# Patient Record
Sex: Female | Born: 1965 | Race: White | Hispanic: No | Marital: Married | Smoking: Former smoker
Health system: Southern US, Community
[De-identification: ages and names within clinical notes are randomized; demographics above are authoritative.]

## PROBLEM LIST (undated history)

## (undated) DIAGNOSIS — I471 Supraventricular tachycardia, unspecified: Secondary | ICD-10-CM

## (undated) DIAGNOSIS — R32 Unspecified urinary incontinence: Secondary | ICD-10-CM

## (undated) DIAGNOSIS — I5189 Other ill-defined heart diseases: Secondary | ICD-10-CM

## (undated) DIAGNOSIS — R51 Headache: Secondary | ICD-10-CM

## (undated) DIAGNOSIS — I4891 Unspecified atrial fibrillation: Secondary | ICD-10-CM

## (undated) DIAGNOSIS — I480 Paroxysmal atrial fibrillation: Secondary | ICD-10-CM

## (undated) DIAGNOSIS — I10 Essential (primary) hypertension: Secondary | ICD-10-CM

## (undated) DIAGNOSIS — D219 Benign neoplasm of connective and other soft tissue, unspecified: Secondary | ICD-10-CM

## (undated) DIAGNOSIS — R519 Headache, unspecified: Secondary | ICD-10-CM

## (undated) HISTORY — PX: GANGLION CYST EXCISION: SHX1691

## (undated) HISTORY — DX: Headache: R51

## (undated) HISTORY — DX: Supraventricular tachycardia, unspecified: I47.10

## (undated) HISTORY — PX: OTHER SURGICAL HISTORY: SHX169

## (undated) HISTORY — DX: Unspecified urinary incontinence: R32

## (undated) HISTORY — DX: Headache, unspecified: R51.9

## (undated) HISTORY — PX: TOOTH EXTRACTION: SUR596

## (undated) HISTORY — DX: Paroxysmal atrial fibrillation: I48.0

## (undated) HISTORY — PX: CARPAL TUNNEL RELEASE: SHX101

## (undated) HISTORY — DX: Other ill-defined heart diseases: I51.89

---

## 1999-05-08 ENCOUNTER — Ambulatory Visit: Admission: RE | Admit: 1999-05-08 | Discharge: 1999-05-08 | Payer: Self-pay | Admitting: Otolaryngology

## 1999-09-11 ENCOUNTER — Ambulatory Visit (HOSPITAL_COMMUNITY): Admission: RE | Admit: 1999-09-11 | Discharge: 1999-09-11 | Payer: Self-pay | Admitting: Orthopedic Surgery

## 1999-09-11 ENCOUNTER — Encounter: Payer: Self-pay | Admitting: Orthopedic Surgery

## 1999-10-13 ENCOUNTER — Other Ambulatory Visit: Admission: RE | Admit: 1999-10-13 | Discharge: 1999-10-13 | Payer: Self-pay | Admitting: Orthopedic Surgery

## 2000-10-10 ENCOUNTER — Encounter: Payer: Self-pay | Admitting: Emergency Medicine

## 2000-10-10 ENCOUNTER — Emergency Department (HOSPITAL_COMMUNITY): Admission: EM | Admit: 2000-10-10 | Discharge: 2000-10-11 | Payer: Self-pay | Admitting: Emergency Medicine

## 2001-07-19 ENCOUNTER — Emergency Department (HOSPITAL_COMMUNITY): Admission: EM | Admit: 2001-07-19 | Discharge: 2001-07-19 | Payer: Self-pay | Admitting: Emergency Medicine

## 2002-02-04 ENCOUNTER — Encounter: Payer: Self-pay | Admitting: Specialist

## 2002-02-04 ENCOUNTER — Encounter: Admission: RE | Admit: 2002-02-04 | Discharge: 2002-02-04 | Payer: Self-pay | Admitting: Specialist

## 2004-02-03 ENCOUNTER — Other Ambulatory Visit: Admission: RE | Admit: 2004-02-03 | Discharge: 2004-02-03 | Payer: Self-pay | Admitting: Obstetrics and Gynecology

## 2005-11-08 ENCOUNTER — Ambulatory Visit: Payer: Self-pay | Admitting: Internal Medicine

## 2005-12-13 ENCOUNTER — Ambulatory Visit: Payer: Self-pay | Admitting: Internal Medicine

## 2005-12-21 ENCOUNTER — Ambulatory Visit: Payer: Self-pay

## 2006-04-03 ENCOUNTER — Ambulatory Visit: Payer: Self-pay | Admitting: Internal Medicine

## 2006-04-08 ENCOUNTER — Encounter (INDEPENDENT_AMBULATORY_CARE_PROVIDER_SITE_OTHER): Payer: Self-pay | Admitting: *Deleted

## 2006-04-08 ENCOUNTER — Other Ambulatory Visit: Admission: RE | Admit: 2006-04-08 | Discharge: 2006-04-08 | Payer: Self-pay | Admitting: Obstetrics and Gynecology

## 2006-05-28 ENCOUNTER — Encounter: Admission: RE | Admit: 2006-05-28 | Discharge: 2006-05-28 | Payer: Self-pay | Admitting: Obstetrics and Gynecology

## 2007-03-27 ENCOUNTER — Emergency Department (HOSPITAL_COMMUNITY): Admission: EM | Admit: 2007-03-27 | Discharge: 2007-03-28 | Payer: Self-pay | Admitting: Emergency Medicine

## 2007-03-28 ENCOUNTER — Ambulatory Visit: Payer: Self-pay | Admitting: Internal Medicine

## 2007-03-28 LAB — CONVERTED CEMR LAB
Inflenza A Ag: NEGATIVE
Influenza B Ag: NEGATIVE

## 2007-03-31 ENCOUNTER — Ambulatory Visit: Payer: Self-pay | Admitting: Cardiology

## 2007-06-19 ENCOUNTER — Encounter: Admission: RE | Admit: 2007-06-19 | Discharge: 2007-06-19 | Payer: Self-pay | Admitting: Obstetrics and Gynecology

## 2008-06-22 ENCOUNTER — Encounter: Admission: RE | Admit: 2008-06-22 | Discharge: 2008-06-22 | Payer: Self-pay | Admitting: Obstetrics and Gynecology

## 2009-01-25 ENCOUNTER — Telehealth: Payer: Self-pay | Admitting: Internal Medicine

## 2009-08-09 ENCOUNTER — Encounter: Admission: RE | Admit: 2009-08-09 | Discharge: 2009-08-09 | Payer: Self-pay | Admitting: Obstetrics and Gynecology

## 2009-10-29 ENCOUNTER — Encounter: Admission: RE | Admit: 2009-10-29 | Discharge: 2009-10-29 | Payer: Self-pay | Admitting: Orthopedic Surgery

## 2010-09-07 ENCOUNTER — Encounter: Admission: RE | Admit: 2010-09-07 | Discharge: 2010-09-07 | Payer: Self-pay | Admitting: Obstetrics and Gynecology

## 2011-01-15 ENCOUNTER — Encounter: Payer: Self-pay | Admitting: Obstetrics and Gynecology

## 2013-01-12 ENCOUNTER — Other Ambulatory Visit: Payer: Self-pay | Admitting: Obstetrics and Gynecology

## 2013-01-12 DIAGNOSIS — D259 Leiomyoma of uterus, unspecified: Secondary | ICD-10-CM

## 2013-01-14 ENCOUNTER — Ambulatory Visit
Admission: RE | Admit: 2013-01-14 | Discharge: 2013-01-14 | Disposition: A | Discharge status: Home / Self Care | Payer: BC Managed Care – PPO | Source: Ambulatory Visit | Attending: Obstetrics and Gynecology | Admitting: Obstetrics and Gynecology

## 2013-01-14 DIAGNOSIS — D259 Leiomyoma of uterus, unspecified: Secondary | ICD-10-CM

## 2013-01-14 HISTORY — DX: Benign neoplasm of connective and other soft tissue, unspecified: D21.9

## 2013-01-14 HISTORY — DX: Essential (primary) hypertension: I10

## 2013-01-15 ENCOUNTER — Other Ambulatory Visit: Payer: Self-pay | Admitting: Radiology

## 2013-01-15 DIAGNOSIS — D219 Benign neoplasm of connective and other soft tissue, unspecified: Secondary | ICD-10-CM

## 2013-01-16 ENCOUNTER — Other Ambulatory Visit: Payer: Self-pay | Admitting: Emergency Medicine

## 2013-01-16 DIAGNOSIS — D219 Benign neoplasm of connective and other soft tissue, unspecified: Secondary | ICD-10-CM

## 2013-01-24 ENCOUNTER — Other Ambulatory Visit: Payer: BC Managed Care – PPO

## 2013-01-31 ENCOUNTER — Other Ambulatory Visit: Payer: BC Managed Care – PPO

## 2013-02-07 ENCOUNTER — Other Ambulatory Visit: Payer: BC Managed Care – PPO

## 2013-02-17 ENCOUNTER — Telehealth: Payer: Self-pay | Admitting: Emergency Medicine

## 2013-02-17 NOTE — Telephone Encounter (Signed)
Lm to see if pt interested in r/s her MRI

## 2013-04-20 ENCOUNTER — Other Ambulatory Visit: Payer: Self-pay | Admitting: Obstetrics and Gynecology

## 2013-04-20 DIAGNOSIS — N6315 Unspecified lump in the right breast, overlapping quadrants: Secondary | ICD-10-CM

## 2013-04-22 ENCOUNTER — Ambulatory Visit
Admission: RE | Admit: 2013-04-22 | Discharge: 2013-04-22 | Disposition: A | Discharge status: Home / Self Care | Payer: BC Managed Care – PPO | Source: Ambulatory Visit | Attending: Obstetrics and Gynecology | Admitting: Obstetrics and Gynecology

## 2013-04-22 DIAGNOSIS — N6315 Unspecified lump in the right breast, overlapping quadrants: Secondary | ICD-10-CM

## 2014-02-12 ENCOUNTER — Encounter: Payer: Self-pay | Admitting: Family Medicine

## 2014-02-12 ENCOUNTER — Ambulatory Visit (INDEPENDENT_AMBULATORY_CARE_PROVIDER_SITE_OTHER): Payer: BC Managed Care – PPO | Admitting: Family Medicine

## 2014-02-12 VITALS — BP 164/102 | HR 79 | Temp 98.1°F | Ht 62.5 in | Wt 172.5 lb

## 2014-02-12 DIAGNOSIS — D259 Leiomyoma of uterus, unspecified: Secondary | ICD-10-CM

## 2014-02-12 DIAGNOSIS — E1159 Type 2 diabetes mellitus with other circulatory complications: Secondary | ICD-10-CM | POA: Insufficient documentation

## 2014-02-12 DIAGNOSIS — Z136 Encounter for screening for cardiovascular disorders: Secondary | ICD-10-CM

## 2014-02-12 DIAGNOSIS — I10 Essential (primary) hypertension: Secondary | ICD-10-CM | POA: Insufficient documentation

## 2014-02-12 DIAGNOSIS — D219 Benign neoplasm of connective and other soft tissue, unspecified: Secondary | ICD-10-CM | POA: Insufficient documentation

## 2014-02-12 DIAGNOSIS — R635 Abnormal weight gain: Secondary | ICD-10-CM | POA: Insufficient documentation

## 2014-02-12 LAB — HEMOGLOBIN A1C
Hgb A1c MFr Bld: 5.4 % (ref <5.7)
Mean Plasma Glucose: 108 mg/dL (ref <117)

## 2014-02-12 MED ORDER — LOSARTAN POTASSIUM-HCTZ 100-12.5 MG PO TABS: 1.0000 | ORAL_TABLET | Freq: Every day | ORAL | Status: DC

## 2014-02-12 NOTE — Assessment & Plan Note (Signed)
Deteriorated.  Refilled hyzaar. She will follow up with me for BP recheck in 2 weeks. The patient indicates understanding of these issues and agrees with the plan.

## 2014-02-12 NOTE — Assessment & Plan Note (Signed)
She is scheduled for hysterectomy with Dr. Radene Knee next month.

## 2014-02-12 NOTE — Progress Notes (Signed)
Subjective:   Patient ID: Wanda Aguilar, female    DOB: [birth year], 48 y.o.   MRN: [id number]  Wanda Aguilar is a pleasant 48 y.o. year old female who presents to clinic today with [place] and discuss BP meds  on 02/12/2014  HPI: HTN- ran out of her Hyzaar two weeks ago.  Can tell BP is elevated.  No HA, blurred vision, CP or SOB.  Fibroids- sees Dr. Radene Knee. He told her yesterday that she needs a complete hysterectomy. Has been having more abdominal pain- scheduled on March 3rd.  Weight gain- has gained quite a bit of weight over past year.  She admits to being able to improve her diet and needs to exercise but is asking about diet pills for a jump start.    Has not had cholesterol checked in a while.  Patient Active Problem List   Diagnosis Date Noted  . HTN (hypertension) 02/12/2014  . Fibroids 02/12/2014  . Weight gain 02/12/2014   Past Medical History  Diagnosis Date  . Hypertension   . Fibroids   . Frequent headaches   . Urine incontinence    Past Surgical History  Procedure Laterality Date  . Ceaserean    . Carpal tunnel release      Right Hand  . Ganglion cyst excision      Bilateral  . Cesarean section    . Tooth extraction     History  Substance Use Topics  . Smoking status: Former Smoker -- 0.30 packs/day for 4 years    Types: Cigarettes    Start date: 01/14/2009  . Smokeless tobacco: Never Used  . Alcohol Use: No   Family History  Problem Relation Age of Onset  . Diabetes Father   . Diabetes Paternal Aunt   . Diabetes Paternal Uncle   . Arthritis Maternal Grandmother   . Alcohol abuse Maternal Grandfather   . Diabetes Paternal Grandmother   . Diabetes Paternal Grandfather    No Known Allergies No current outpatient prescriptions on file prior to visit.   No current facility-administered medications on file prior to visit.   The PMH, PSH, Social History, Family History, Medications, and allergies have been reviewed in [facility], and have  been updated if relevant.   Review of Systems  Constitutional: Negative.   Eyes: Negative.   Respiratory: Negative.   Cardiovascular: Negative.   Endocrine: Negative.   + nasal congestion and sinus pressure for months  See HPI    Objective:    BP 164/102  Pulse 79  Temp(Src) 98.1 F (36.7 C) (Oral)  Ht 5' 2.5" (1.588 m)  Wt 172 lb 8 oz (78.245 kg)  BMI 31.03 kg/m2  SpO2 97%  LMP 01/04/2014   Physical Exam  Nursing note and vitals reviewed. Constitutional: She appears well-developed and well-nourished. No distress.  HENT:  Head: Normocephalic and atraumatic.  Right Ear: Hearing and tympanic membrane normal.  Left Ear: Hearing and tympanic membrane normal.  Nose: Rhinorrhea present. No mucosal edema, sinus tenderness or nasal deformity. Right sinus exhibits no frontal sinus tenderness. Left sinus exhibits no frontal sinus tenderness.  Cardiovascular: Normal rate, regular rhythm and normal pulses.   Pulmonary/Chest: Effort normal and breath sounds normal.  Skin: Skin is warm, dry and intact.  Psychiatric: She has a normal mood and affect. Her speech is normal and behavior is normal. Judgment and thought content normal. Cognition and memory are normal.          Assessment & Plan:  HTN (hypertension) No Follow-up on file.

## 2014-02-12 NOTE — Assessment & Plan Note (Signed)
Discussed with pt- I will not give her an appetite suppressant like phentermine given her HTN. Advised diet, exercise- suggested nutritionist referral. She would like to consider this after her hysterectomy.  She will call me if she decides she wants a referral. Check labs today. Orders Placed This Encounter  Procedures  . Hemoglobin A1c  . Lipid panel  . TSH  . T4, Free  . Comprehensive metabolic panel

## 2014-02-12 NOTE — Patient Instructions (Addendum)
Nice to meet you. We will call you with your lab results.  Try over the counter nasocort-start with 2 sprays per nostril per day...and then try to taper to 1 spray per nostril once symptoms improve.   Restart your blood pressure medication.  Please come see me in 2 weeks.

## 2014-02-12 NOTE — Progress Notes (Signed)
Pre visit review using our clinic review tool, if applicable. No additional management support is needed unless otherwise documented below in the visit note. 

## 2014-02-13 LAB — COMPREHENSIVE METABOLIC PANEL
ALBUMIN: 4.9 g/dL (ref 3.5–5.2)
ALT: 20 U/L (ref 0–35)
AST: 15 U/L (ref 0–37)
Alkaline Phosphatase: 46 U/L (ref 39–117)
BUN: 11 mg/dL (ref 6–23)
CALCIUM: 9.4 mg/dL (ref 8.4–10.5)
CHLORIDE: 103 meq/L (ref 96–112)
CO2: 27 meq/L (ref 19–32)
CREATININE: 0.61 mg/dL (ref 0.50–1.10)
GLUCOSE: 90 mg/dL (ref 70–99)
POTASSIUM: 4.6 meq/L (ref 3.5–5.3)
Sodium: 137 mEq/L (ref 135–145)
Total Bilirubin: 0.5 mg/dL (ref 0.2–1.2)
Total Protein: 6.8 g/dL (ref 6.0–8.3)

## 2014-02-13 LAB — LIPID PANEL
CHOL/HDL RATIO: 4 ratio
Cholesterol: 175 mg/dL (ref 0–200)
HDL: 44 mg/dL (ref >39)
LDL Cholesterol: 96 mg/dL (ref 0–99)
TRIGLYCERIDES: 175 mg/dL — AB (ref <150)
VLDL: 35 mg/dL (ref 0–40)

## 2014-02-13 LAB — TSH: TSH: 0.781 u[IU]/mL (ref 0.350–4.500)

## 2014-02-13 LAB — T4, FREE: Free T4: 1.09 ng/dL (ref 0.80–1.80)

## 2014-02-15 ENCOUNTER — Encounter: Payer: Self-pay | Admitting: *Deleted

## 2014-02-15 ENCOUNTER — Telehealth: Payer: Self-pay | Admitting: Family Medicine

## 2014-02-15 NOTE — Telephone Encounter (Signed)
Relevant patient education mailed to patient.  

## 2014-03-04 ENCOUNTER — Ambulatory Visit (INDEPENDENT_AMBULATORY_CARE_PROVIDER_SITE_OTHER): Payer: BC Managed Care – PPO | Admitting: Family Medicine

## 2014-03-04 ENCOUNTER — Encounter: Payer: Self-pay | Admitting: Family Medicine

## 2014-03-04 VITALS — BP 128/62 | HR 78 | Temp 97.8°F | Wt 171.2 lb

## 2014-03-04 DIAGNOSIS — I10 Essential (primary) hypertension: Secondary | ICD-10-CM

## 2014-03-04 DIAGNOSIS — F411 Generalized anxiety disorder: Secondary | ICD-10-CM | POA: Insufficient documentation

## 2014-03-04 MED ORDER — SERTRALINE HCL 25 MG PO TABS: 25.0000 mg | ORAL_TABLET | Freq: Every day | ORAL | Status: DC

## 2014-03-04 MED ORDER — ALPRAZOLAM 0.25 MG PO TABS: 0.2500 mg | ORAL_TABLET | Freq: Two times a day (BID) | ORAL | Status: DC | PRN

## 2014-03-04 NOTE — Patient Instructions (Signed)
Good to see you. We are starting zoloft 25 mg daily (please start this during the weekend). Use xanax as needed for panic attacks.

## 2014-03-04 NOTE — Assessment & Plan Note (Signed)
Well controlled in our office. No change in rx. Advised NOT checking BP at work. The patient indicates understanding of these issues and agrees with the plan.

## 2014-03-04 NOTE — Progress Notes (Signed)
Pre visit review using our clinic review tool, if applicable. No additional management support is needed unless otherwise documented below in the visit note. 

## 2014-03-04 NOTE — Assessment & Plan Note (Signed)
She is very concerned about starting SSRI.  Asks for valium but I explained benzos are not good long term solution.  After some counseling, she agreed to start zoloft 25 mg daily- prn xanax (#20) to use only for panic attacks until zoloft can get into her system in therapeutic dose. Declined psychotherapy. Follow up in 3 weeks.

## 2014-03-04 NOTE — Progress Notes (Signed)
Subjective:   Patient ID: Wayland Denis, female    DOB: 16-Jan-1966, 48 y.o.   MRN: [id number]  Daleyssa Loiselle Breth is a pleasant 48 y.o. year old female who presents to clinic today with Follow-up  on 03/04/2014  HPI: HTN- When she established care with me last month, ran out of her Hyzaar a couple of weeks prior to her appointment.  Could tell BP was elevated.  No HA, blurred vision, CP or SOB.  Lab Results  Component Value Date   CREATININE 0.61 02/12/2014    Refilled her hyzaar 100-12.5 mg daily and advised her to follow up with me in 2 weeks.  She is here today to follow up. Since I last saw her, a friend gave her a home BP machine. Has been checking her BP at work and often very elevated in 160s/90s.  She is very tearful today- under a lot of stress.  In pain and wants to have her surgery.  She is concerned that she will not be able to have the surgery if her BP is elevated.  A lot of stressors with her daughter and her mother as well. Denies feeling depressed.  Can feel her heart racing at times.  Sleeping ok. Appetite good.  Weight stable. Wt Readings from Last 3 Encounters:  03/04/14 171 lb 4 oz (77.678 kg)  02/12/14 172 lb 8 oz (78.245 kg)  01/14/13 160 lb (72.576 kg)       Patient Active Problem List   Diagnosis Date Noted  . HTN (hypertension) 02/12/2014  . Fibroids 02/12/2014  . Weight gain 02/12/2014   Past Medical History  Diagnosis Date  . Hypertension   . Fibroids   . Frequent headaches   . Urine incontinence    Past Surgical History  Procedure Laterality Date  . Ceaserean    . Carpal tunnel release      Right Hand  . Ganglion cyst excision      Bilateral  . Cesarean section    . Tooth extraction     History  Substance Use Topics  . Smoking status: Former Smoker -- 0.30 packs/day for 4 years    Types: Cigarettes    Start date: 01/14/2009  . Smokeless tobacco: Never Used  . Alcohol Use: No   Family History  Problem Relation Age of Onset  .  Diabetes Father   . Diabetes Paternal Aunt   . Diabetes Paternal Uncle   . Arthritis Maternal Grandmother   . Alcohol abuse Maternal Grandfather   . Diabetes Paternal Grandmother   . Diabetes Paternal Grandfather    No Known Allergies Current Outpatient Prescriptions on File Prior to Visit  Medication Sig Dispense Refill  . losartan-hydrochlorothiazide (HYZAAR) 100-12.5 MG per tablet Take 1 tablet by mouth daily.  90 tablet  3   No current facility-administered medications on file prior to visit.   The PMH, PSH, Social History, Family History, Medications, and allergies have been reviewed in [facility], and have been updated if relevant.   Review of Systems  Constitutional: Negative.   Eyes: Negative.   Respiratory: Negative.   Cardiovascular: Negative.   Endocrine: Negative.    See HPI    Objective:    LMP 01/04/2014   Physical Exam  Nursing note and vitals reviewed. Constitutional: She appears well-developed and well-nourished. No distress.  HENT:  Head: Normocephalic and atraumatic.  Right Ear: Hearing and tympanic membrane normal.  Left Ear: Hearing and tympanic membrane normal.  Nose: No mucosal edema, rhinorrhea,  sinus tenderness or nasal deformity. Right sinus exhibits no frontal sinus tenderness. Left sinus exhibits no frontal sinus tenderness.  Cardiovascular: Normal rate, regular rhythm and normal pulses.   Pulmonary/Chest: Effort normal and breath sounds normal.  Skin: Skin is warm, dry and intact.  Psychiatric: Her speech is normal and behavior is normal. Judgment and thought content normal. Her mood appears anxious. Cognition and memory are normal.          Assessment & Plan:   No diagnosis found. No Follow-up on file.

## 2014-03-05 ENCOUNTER — Telehealth: Payer: Self-pay | Admitting: Family Medicine

## 2014-03-05 NOTE — Telephone Encounter (Signed)
Relevant patient education mailed to patient.  

## 2014-03-29 ENCOUNTER — Ambulatory Visit: Payer: BC Managed Care – PPO | Admitting: Family Medicine

## 2014-04-02 ENCOUNTER — Encounter (HOSPITAL_COMMUNITY): Payer: Self-pay | Admitting: Pharmacist

## 2014-04-09 ENCOUNTER — Encounter (HOSPITAL_COMMUNITY): Payer: Self-pay

## 2014-04-09 ENCOUNTER — Encounter (HOSPITAL_COMMUNITY)
Admission: RE | Admit: 2014-04-09 | Discharge: 2014-04-09 | Disposition: A | Discharge status: Home / Self Care | Payer: BC Managed Care – PPO | Source: Ambulatory Visit | Attending: Obstetrics and Gynecology | Admitting: Obstetrics and Gynecology

## 2014-04-09 DIAGNOSIS — Z01812 Encounter for preprocedural laboratory examination: Secondary | ICD-10-CM | POA: Insufficient documentation

## 2014-04-09 DIAGNOSIS — Z01810 Encounter for preprocedural cardiovascular examination: Secondary | ICD-10-CM | POA: Insufficient documentation

## 2014-04-09 LAB — BASIC METABOLIC PANEL
BUN: 10 mg/dL (ref 6–23)
CHLORIDE: 98 meq/L (ref 96–112)
CO2: 26 mEq/L (ref 19–32)
Calcium: 9.3 mg/dL (ref 8.4–10.5)
Creatinine, Ser: 0.61 mg/dL (ref 0.50–1.10)
GFR calc Af Amer: >90 mL/min (ref >90)
Glucose, Bld: 109 mg/dL — ABNORMAL HIGH (ref 70–99)
POTASSIUM: 3.2 meq/L — AB (ref 3.7–5.3)
SODIUM: 137 meq/L (ref 137–147)

## 2014-04-09 LAB — CBC
HCT: 41 % (ref 36.0–46.0)
Hemoglobin: 14.5 g/dL (ref 12.0–15.0)
MCH: 32.2 pg (ref 26.0–34.0)
MCHC: 35.4 g/dL (ref 30.0–36.0)
MCV: 90.9 fL (ref 78.0–100.0)
Platelets: 238 10*3/uL (ref 150–400)
RBC: 4.51 MIL/uL (ref 3.87–5.11)
RDW: 12.7 % (ref 11.5–15.5)
WBC: 7.4 10*3/uL (ref 4.0–10.5)

## 2014-04-09 NOTE — H&P (Signed)
  Patient name    Mckaylin, Bastien DICTATION#  [id number] CSN# [id number]  Darlyn Chamber, MD 04/09/2014 8:34 AM

## 2014-04-09 NOTE — H&P (Signed)
Wanda Aguilar, Wanda Aguilar               ACCOUNT NO.:  [id number]  MEDICAL RECORD NO.:  [id number]  LOCATION:  [facility]                         FACILITY:  [facility]  PHYSICIAN:  Darlyn Chamber, M.D.   DATE OF BIRTH:  1966-11-24  DATE OF ADMISSION:  02/22/2014 DATE OF DISCHARGE:                             HISTORY & PHYSICAL   HISTORY OF PRESENT ILLNESS:  The patient is a 48 year old, gravida 2, para 2 female presents for total abdominal hysterectomy with bilateral salpingo-oophorectomy.  The patient has had enlarging uterine fibroids with associated symptomatology.  She has had been increased in pressure, backache, and abnormal bleeding.  Ultrasound has confirmed that the fibroids have enlarged until almost the umbilicus.  We had discussed alternatives including trying to suppress with hormonal agents versus radiological embolization versus myomectomy versus hysterectomy.  The patient now presents for the above-noted surgery.  ALLERGIES:  She has no known drug allergies.  MEDICATIONS:  She is on losartan/hydrochlorothiazide 100/12.5.  She is also on Zoloft each day.  PAST MEDICAL HISTORY:  She has a history of hypertension under active management.  Does have a history of migraine headaches.  She has had a history of an abnormal Pap smear with subsequent LEEP of the cervix to be a moderate dysplasia.  She has had 2 prior cesarean sections, the last one done with bilateral tubal ligation.  PAST SOCIAL HISTORY:  Tobacco use.  She is not smoking at the present time.  No alcohol use.  FAMILY HISTORY:  Noncontributory.  REVIEW OF SYSTEMS:  Noncontributory.  PHYSICAL EXAMINATION:  VITAL SIGNS:  The patient is afebrile, stable vital signs. HEENT:  The patient is normocephalic.  Pupils equal, round, reactive to light and accommodation.  Extraocular movements were intact.  Sclerae and conjunctivae are clear.  Oropharynx clear. NECK:  Without thyromegaly. BREASTS:  Not examined. LUNGS:   Clear. HEART:  Regular rate without murmurs or gallops. ABDOMEN:  She has a well-healed low-transverse incision.  Fibroids up to the umbilicus.  No organomegaly noted. PELVIC:  Normal external genitalia.  Vaginal cuff is clear.  Cervix unremarkable.  Uterus 18 weeks in size.  Adnexa, difficult to assess. EXTREMITIES:  Trace edema. NEUROLOGIC:  Gross normal limits.  IMPRESSION: 1. Enlarging fibroids with associated symptomatology. 2. Hypertension, under active management. 3. History of cervical dysplasia.  PLAN:  The patient undergo a total abdominal hysterectomy with bilateral salpingo-oophorectomy.  I have discussed the option of leaving the ovaries and taking the tubes.  However, she wants ovaries removed.  The potential need for estrogen replacement therapy with associated risks and complications have been discussed.  The risks of surgery explained including the risk of infection.  The risk of hemorrhage that could require transfusion with the risk of AIDS or hepatitis.  The risk of injury to adjacent organs such as bladder, bowel, or ureters that could require further exploratory surgery.  Risk of deep venous thrombosis and pulmonary emboli.  The patient does understand potential risks, complications, and alternatives.     Darlyn Chamber, M.D.     JSM/MEDQ  D:  04/09/2014  T:  04/09/2014  Job:  [id number]

## 2014-04-09 NOTE — Patient Instructions (Signed)
[place]  04/09/2014   Your procedure is scheduled on:  04/13/14  Enter through the Main Entrance of [facility] at [place] up the phone at the desk and dial [phone].   Call this number if you have problems the morning of surgery: [phone]   Remember:   Do not eat food:After Midnight.  Do not drink clear liquids: After Midnight.  Take these medicines the morning of surgery with A SIP OF WATER: Blood pressure medication   Do not wear jewelry, make-up or nail polish.  Do not wear lotions, powders, or perfumes. You may wear deodorant.  Do not shave 48 hours prior to surgery.  Do not bring valuables to the hospital.  [facility] is not   responsible for any belongings or valuables brought to the hospital.  Contacts, dentures or bridgework may not be worn into surgery.  Leave suitcase in the car. After surgery it may be brought to your room.  For patients admitted to the hospital, checkout time is 11:00 AM the day of              discharge.   Patients discharged the day of surgery will not be allowed to drive             home.  Name and phone number of your driver: NA  Special Instructions:      Please read over the following fact sheets that you were given:   Surgical Site Infection Prevention

## 2014-04-13 ENCOUNTER — Inpatient Hospital Stay (HOSPITAL_COMMUNITY): Payer: BC Managed Care – PPO | Admitting: Anesthesiology

## 2014-04-13 ENCOUNTER — Encounter (HOSPITAL_COMMUNITY): Payer: BC Managed Care – PPO | Admitting: Anesthesiology

## 2014-04-13 ENCOUNTER — Encounter (HOSPITAL_COMMUNITY)
Admission: RE | Disposition: A | Discharge status: Home / Self Care | Payer: Self-pay | Source: Ambulatory Visit | Attending: Obstetrics and Gynecology

## 2014-04-13 ENCOUNTER — Encounter (HOSPITAL_COMMUNITY): Payer: Self-pay | Admitting: *Deleted

## 2014-04-13 ENCOUNTER — Inpatient Hospital Stay (HOSPITAL_COMMUNITY)
Admission: RE | Admit: 2014-04-13 | Discharge: 2014-04-15 | DRG: 743 | Disposition: A | Discharge status: Home / Self Care | Payer: BC Managed Care – PPO | Source: Ambulatory Visit | Attending: Obstetrics and Gynecology | Admitting: Obstetrics and Gynecology

## 2014-04-13 DIAGNOSIS — Z9079 Acquired absence of other genital organ(s): Secondary | ICD-10-CM

## 2014-04-13 DIAGNOSIS — I10 Essential (primary) hypertension: Secondary | ICD-10-CM | POA: Diagnosis present

## 2014-04-13 DIAGNOSIS — D251 Intramural leiomyoma of uterus: Principal | ICD-10-CM | POA: Diagnosis present

## 2014-04-13 DIAGNOSIS — Z90722 Acquired absence of ovaries, bilateral: Secondary | ICD-10-CM

## 2014-04-13 DIAGNOSIS — Z87410 Personal history of cervical dysplasia: Secondary | ICD-10-CM

## 2014-04-13 DIAGNOSIS — D250 Submucous leiomyoma of uterus: Secondary | ICD-10-CM | POA: Diagnosis present

## 2014-04-13 DIAGNOSIS — N8300 Follicular cyst of ovary, unspecified side: Secondary | ICD-10-CM | POA: Diagnosis present

## 2014-04-13 DIAGNOSIS — Z87891 Personal history of nicotine dependence: Secondary | ICD-10-CM

## 2014-04-13 DIAGNOSIS — D252 Subserosal leiomyoma of uterus: Secondary | ICD-10-CM | POA: Diagnosis present

## 2014-04-13 DIAGNOSIS — Z90710 Acquired absence of both cervix and uterus: Secondary | ICD-10-CM

## 2014-04-13 HISTORY — PX: ABDOMINAL HYSTERECTOMY: SHX81

## 2014-04-13 LAB — POTASSIUM: POTASSIUM: 3.8 meq/L (ref 3.7–5.3)

## 2014-04-13 LAB — HCG, SERUM, QUALITATIVE: PREG SERUM: NEGATIVE

## 2014-04-13 SURGERY — HYSTERECTOMY, ABDOMINAL
Anesthesia: General | Site: Abdomen | Laterality: Bilateral

## 2014-04-13 MED ORDER — HYDROMORPHONE 0.3 MG/ML IV SOLN
INTRAVENOUS | Status: DC
  Administered 2014-04-13: 0.99 mg via INTRAVENOUS
  Administered 2014-04-13: 0.6 mg via INTRAVENOUS
  Administered 2014-04-13: 2.59 mg via INTRAVENOUS
  Administered 2014-04-13: 12:00:00 via INTRAVENOUS
  Administered 2014-04-14: 0.799 mg via INTRAVENOUS
  Administered 2014-04-14: 1.59 mg via INTRAVENOUS
  Administered 2014-04-14: 0.4 mg via INTRAVENOUS
  Filled 2014-04-13: qty 25

## 2014-04-13 MED ORDER — ACETAMINOPHEN 325 MG PO TABS
650.0000 mg | ORAL_TABLET | ORAL | Status: DC | PRN
  Administered 2014-04-14: 650 mg via ORAL
  Filled 2014-04-13: qty 2

## 2014-04-13 MED ORDER — SODIUM CHLORIDE 0.9 % IJ SOLN
INTRAMUSCULAR | Status: AC
  Filled 2014-04-13: qty 50

## 2014-04-13 MED ORDER — HYDROMORPHONE HCL PF 1 MG/ML IJ SOLN
INTRAMUSCULAR | Status: DC | PRN
  Administered 2014-04-13 (×2): 1 mg via INTRAVENOUS

## 2014-04-13 MED ORDER — ONDANSETRON HCL 4 MG/2ML IJ SOLN
INTRAMUSCULAR | Status: AC
  Filled 2014-04-13: qty 2

## 2014-04-13 MED ORDER — HYDROCHLOROTHIAZIDE 12.5 MG PO CAPS
12.5000 mg | ORAL_CAPSULE | Freq: Every day | ORAL | Status: DC
  Filled 2014-04-13 (×3): qty 1

## 2014-04-13 MED ORDER — LOSARTAN POTASSIUM 50 MG PO TABS
100.0000 mg | ORAL_TABLET | Freq: Every day | ORAL | Status: DC
  Filled 2014-04-13 (×3): qty 2

## 2014-04-13 MED ORDER — MENTHOL 3 MG MT LOZG
1.0000 | LOZENGE | OROMUCOSAL | Status: DC | PRN
  Filled 2014-04-13: qty 9

## 2014-04-13 MED ORDER — NALOXONE HCL 0.4 MG/ML IJ SOLN: 0.4000 mg | INTRAMUSCULAR | Status: DC | PRN

## 2014-04-13 MED ORDER — BUPIVACAINE LIPOSOME 1.3 % IJ SUSP
20.0000 mL | Freq: Once | INTRAMUSCULAR | Status: DC
Start: 2014-04-13 — End: 2014-04-13
  Filled 2014-04-13: qty 20

## 2014-04-13 MED ORDER — PROMETHAZINE HCL 25 MG/ML IJ SOLN: 6.2500 mg | INTRAMUSCULAR | Status: DC | PRN

## 2014-04-13 MED ORDER — MIDAZOLAM HCL 2 MG/2ML IJ SOLN: 0.5000 mg | Freq: Once | INTRAMUSCULAR | Status: DC | PRN

## 2014-04-13 MED ORDER — SERTRALINE HCL 25 MG PO TABS
25.0000 mg | ORAL_TABLET | Freq: Every day | ORAL | Status: DC
  Administered 2014-04-14: 25 mg via ORAL
  Filled 2014-04-13 (×3): qty 1

## 2014-04-13 MED ORDER — ONDANSETRON HCL 4 MG/2ML IJ SOLN
4.0000 mg | Freq: Four times a day (QID) | INTRAMUSCULAR | Status: DC | PRN
  Administered 2014-04-13: 4 mg via INTRAVENOUS

## 2014-04-13 MED ORDER — HYDROMORPHONE HCL PF 1 MG/ML IJ SOLN
INTRAMUSCULAR | Status: AC
  Administered 2014-04-13: 0.5 mg via INTRAVENOUS
  Filled 2014-04-13: qty 1

## 2014-04-13 MED ORDER — EPHEDRINE SULFATE 50 MG/ML IJ SOLN
INTRAMUSCULAR | Status: DC | PRN
  Administered 2014-04-13: 15 mg via INTRAVENOUS
  Administered 2014-04-13: 10 mg via INTRAVENOUS

## 2014-04-13 MED ORDER — FENTANYL CITRATE 0.05 MG/ML IJ SOLN
INTRAMUSCULAR | Status: AC
  Filled 2014-04-13: qty 5

## 2014-04-13 MED ORDER — KETOROLAC TROMETHAMINE 30 MG/ML IJ SOLN
INTRAMUSCULAR | Status: AC
  Administered 2014-04-13: 30 mg via INTRAVENOUS
  Filled 2014-04-13: qty 1

## 2014-04-13 MED ORDER — GLYCOPYRROLATE 0.2 MG/ML IJ SOLN
INTRAMUSCULAR | Status: AC
  Filled 2014-04-13: qty 2

## 2014-04-13 MED ORDER — DIPHENHYDRAMINE HCL 50 MG/ML IJ SOLN: 12.5000 mg | Freq: Four times a day (QID) | INTRAMUSCULAR | Status: DC | PRN

## 2014-04-13 MED ORDER — BUPIVACAINE LIPOSOME 1.3 % IJ SUSP
INTRAMUSCULAR | Status: DC | PRN
  Administered 2014-04-13: 20 mL

## 2014-04-13 MED ORDER — HYDROMORPHONE HCL PF 1 MG/ML IJ SOLN
INTRAMUSCULAR | Status: AC
  Filled 2014-04-13: qty 1

## 2014-04-13 MED ORDER — ONDANSETRON HCL 4 MG/2ML IJ SOLN
4.0000 mg | Freq: Four times a day (QID) | INTRAMUSCULAR | Status: DC | PRN
  Filled 2014-04-13: qty 2

## 2014-04-13 MED ORDER — KETOROLAC TROMETHAMINE 30 MG/ML IJ SOLN
15.0000 mg | Freq: Once | INTRAMUSCULAR | Status: AC | PRN
  Administered 2014-04-13: 30 mg via INTRAVENOUS

## 2014-04-13 MED ORDER — OXYCODONE-ACETAMINOPHEN 5-325 MG PO TABS
1.0000 | ORAL_TABLET | ORAL | Status: DC | PRN
  Administered 2014-04-14 – 2014-04-15 (×5): 2 via ORAL
  Filled 2014-04-13 (×5): qty 2

## 2014-04-13 MED ORDER — LACTATED RINGERS IV SOLN
INTRAVENOUS | Status: DC
  Administered 2014-04-13: 23:00:00 via INTRAVENOUS
  Filled 2014-04-13 (×11): qty 1000

## 2014-04-13 MED ORDER — LACTATED RINGERS IV SOLN
INTRAVENOUS | Status: DC
  Administered 2014-04-13 (×3): via INTRAVENOUS

## 2014-04-13 MED ORDER — SODIUM CHLORIDE 0.9 % IJ SOLN: 9.0000 mL | INTRAMUSCULAR | Status: DC | PRN

## 2014-04-13 MED ORDER — NEOSTIGMINE METHYLSULFATE 1 MG/ML IJ SOLN
INTRAMUSCULAR | Status: AC
  Filled 2014-04-13: qty 1

## 2014-04-13 MED ORDER — FENTANYL CITRATE 0.05 MG/ML IJ SOLN
INTRAMUSCULAR | Status: DC | PRN
  Administered 2014-04-13 (×2): 50 ug via INTRAVENOUS
  Administered 2014-04-13: 100 ug via INTRAVENOUS
  Administered 2014-04-13: 50 ug via INTRAVENOUS

## 2014-04-13 MED ORDER — PROPOFOL 10 MG/ML IV EMUL
INTRAVENOUS | Status: AC
  Filled 2014-04-13: qty 20

## 2014-04-13 MED ORDER — EPHEDRINE 5 MG/ML INJ
INTRAVENOUS | Status: AC
  Filled 2014-04-13: qty 10

## 2014-04-13 MED ORDER — MEPERIDINE HCL 25 MG/ML IJ SOLN: 6.2500 mg | INTRAMUSCULAR | Status: DC | PRN

## 2014-04-13 MED ORDER — NEOSTIGMINE METHYLSULFATE 1 MG/ML IJ SOLN
INTRAMUSCULAR | Status: DC | PRN
  Administered 2014-04-13: 3 mg via INTRAVENOUS

## 2014-04-13 MED ORDER — ROCURONIUM BROMIDE 100 MG/10ML IV SOLN
INTRAVENOUS | Status: AC
  Filled 2014-04-13: qty 1

## 2014-04-13 MED ORDER — DEXAMETHASONE SODIUM PHOSPHATE 10 MG/ML IJ SOLN
INTRAMUSCULAR | Status: DC | PRN
  Administered 2014-04-13: 5 mg via INTRAVENOUS

## 2014-04-13 MED ORDER — GLYCOPYRROLATE 0.2 MG/ML IJ SOLN
INTRAMUSCULAR | Status: DC | PRN
  Administered 2014-04-13: .4 mg via INTRAVENOUS

## 2014-04-13 MED ORDER — PROPOFOL 10 MG/ML IV BOLUS
INTRAVENOUS | Status: DC | PRN
  Administered 2014-04-13: 180 mg via INTRAVENOUS

## 2014-04-13 MED ORDER — SODIUM CHLORIDE 0.9 % IJ SOLN
INTRAMUSCULAR | Status: DC | PRN
  Administered 2014-04-13: 50 mL via INTRAVENOUS

## 2014-04-13 MED ORDER — ROCURONIUM BROMIDE 100 MG/10ML IV SOLN
INTRAVENOUS | Status: DC | PRN
  Administered 2014-04-13: 40 mg via INTRAVENOUS
  Administered 2014-04-13: 5 mg via INTRAVENOUS
  Administered 2014-04-13: 10 mg via INTRAVENOUS

## 2014-04-13 MED ORDER — KETOROLAC TROMETHAMINE 60 MG/2ML IM SOLN
INTRAMUSCULAR | Status: AC
  Filled 2014-04-13: qty 2

## 2014-04-13 MED ORDER — DIPHENHYDRAMINE HCL 12.5 MG/5ML PO ELIX
12.5000 mg | ORAL_SOLUTION | Freq: Four times a day (QID) | ORAL | Status: DC | PRN
  Filled 2014-04-13: qty 5

## 2014-04-13 MED ORDER — CEFAZOLIN SODIUM-DEXTROSE 2-3 GM-% IV SOLR
2.0000 g | INTRAVENOUS | Status: AC
  Administered 2014-04-13: 2 g via INTRAVENOUS

## 2014-04-13 MED ORDER — CEFAZOLIN SODIUM-DEXTROSE 2-3 GM-% IV SOLR
INTRAVENOUS | Status: AC
  Filled 2014-04-13: qty 50

## 2014-04-13 MED ORDER — HYDROMORPHONE HCL PF 1 MG/ML IJ SOLN
0.2500 mg | INTRAMUSCULAR | Status: DC | PRN
  Administered 2014-04-13: 0.5 mg via INTRAVENOUS
  Administered 2014-04-13: 0.25 mg via INTRAVENOUS
  Administered 2014-04-13: 0.5 mg via INTRAVENOUS

## 2014-04-13 MED ORDER — ONDANSETRON HCL 4 MG PO TABS: 4.0000 mg | ORAL_TABLET | Freq: Four times a day (QID) | ORAL | Status: DC | PRN

## 2014-04-13 MED ORDER — MIDAZOLAM HCL 2 MG/2ML IJ SOLN
INTRAMUSCULAR | Status: AC
  Filled 2014-04-13: qty 2

## 2014-04-13 MED ORDER — ONDANSETRON HCL 4 MG/2ML IJ SOLN
INTRAMUSCULAR | Status: DC | PRN
  Administered 2014-04-13: 4 mg via INTRAVENOUS

## 2014-04-13 MED ORDER — LOSARTAN POTASSIUM-HCTZ 100-12.5 MG PO TABS: 1.0000 | ORAL_TABLET | Freq: Every day | ORAL | Status: DC

## 2014-04-13 MED ORDER — MIDAZOLAM HCL 2 MG/2ML IJ SOLN
INTRAMUSCULAR | Status: DC | PRN
  Administered 2014-04-13: 2 mg via INTRAVENOUS

## 2014-04-13 MED ORDER — LIDOCAINE HCL (CARDIAC) 20 MG/ML IV SOLN
INTRAVENOUS | Status: AC
  Filled 2014-04-13: qty 5

## 2014-04-13 SURGICAL SUPPLY — 29 items
CANISTER SUCT 3000ML (MISCELLANEOUS) ×2 IMPLANT
CLOTH BEACON ORANGE TIMEOUT ST (SAFETY) ×2 IMPLANT
DRAPE WARM FLUID 44X44 (DRAPE) IMPLANT
DRSG OPSITE POSTOP 4X10 (GAUZE/BANDAGES/DRESSINGS) ×2 IMPLANT
GLOVE BIO SURGEON STRL SZ7 (GLOVE) ×2 IMPLANT
GOWN STRL REUS W/TWL LRG LVL3 (GOWN DISPOSABLE) ×4 IMPLANT
NEEDLE HYPO 22GX1.5 SAFETY (NEEDLE) IMPLANT
NS IRRIG 1000ML POUR BTL (IV SOLUTION) ×2 IMPLANT
PACK ABDOMINAL GYN (CUSTOM PROCEDURE TRAY) ×2 IMPLANT
PAD OB MATERNITY 4.3X12.25 (PERSONAL CARE ITEMS) ×2 IMPLANT
PROTECTOR NERVE ULNAR (MISCELLANEOUS) ×2 IMPLANT
SPONGE LAP 18X18 X RAY DECT (DISPOSABLE) ×4 IMPLANT
STAPLER VISISTAT 35W (STAPLE) IMPLANT
STRIP CLOSURE SKIN 1/4X4 (GAUZE/BANDAGES/DRESSINGS) ×2 IMPLANT
SUT CHROMIC 0 SH (SUTURE) IMPLANT
SUT CHROMIC 2 0 SH (SUTURE) IMPLANT
SUT CHROMIC 3 0 SH 27 (SUTURE) IMPLANT
SUT MON AB 4-0 PS1 27 (SUTURE) IMPLANT
SUT PDS AB 0 CT 36 (SUTURE) ×2 IMPLANT
SUT VIC AB 0 CT1 18XCR BRD8 (SUTURE) ×3 IMPLANT
SUT VIC AB 0 CT1 27 (SUTURE) ×1
SUT VIC AB 0 CT1 27XBRD ANBCTR (SUTURE) ×1 IMPLANT
SUT VIC AB 0 CT1 8-18 (SUTURE) ×3
SUT VIC AB 2-0 CT1 (SUTURE) ×2 IMPLANT
SUT VICRYL 0 TIES 12 18 (SUTURE) ×2 IMPLANT
SYR 30ML LL (SYRINGE) ×2 IMPLANT
TOWEL OR 17X24 6PK STRL BLUE (TOWEL DISPOSABLE) ×4 IMPLANT
TRAY FOLEY CATH 14FR (SET/KITS/TRAYS/PACK) ×2 IMPLANT
WATER STERILE IRR 1000ML POUR (IV SOLUTION) ×2 IMPLANT

## 2014-04-13 NOTE — Anesthesia Preprocedure Evaluation (Addendum)
Anesthesia Evaluation  Patient identified by MRN, date of birth, ID band Patient awake    Reviewed: Allergy & Precautions, H&P , Patient's Chart, lab work & pertinent test results, reviewed documented beta blocker date and time   History of Anesthesia Complications Negative for: history of anesthetic complications  Airway Mallampati: II TM Distance: >3 FB Neck ROM: full    Dental   Pulmonary former smoker,  breath sounds clear to auscultation        Cardiovascular Exercise Tolerance: Good hypertension, Rhythm:regular Rate:Normal     Neuro/Psych  Headaches,    GI/Hepatic   Endo/Other    Renal/GU      Musculoskeletal   Abdominal   Peds  Hematology   Anesthesia Other Findings   Reproductive/Obstetrics                          Anesthesia Physical Anesthesia Plan  ASA: II  Anesthesia Plan: General ETT   Post-op Pain Management:    Induction:   Airway Management Planned:   Additional Equipment:   Intra-op Plan:   Post-operative Plan:   Informed Consent: I have reviewed the patients History and Physical, chart, labs and discussed the procedure including the risks, benefits and alternatives for the proposed anesthesia with the patient or authorized representative who has indicated his/her understanding and acceptance.   Dental Advisory Given  Plan Discussed with: CRNA and Surgeon  Anesthesia Plan Comments:         Anesthesia Quick Evaluation

## 2014-04-13 NOTE — Brief Op Note (Signed)
04/13/2014  9:06 AM  PATIENT:  Wanda Aguilar  48 y.o. female  PRE-OPERATIVE DIAGNOSIS:  FIBROIDS  POST-OPERATIVE DIAGNOSIS:  * No post-op diagnosis entered *  PROCEDURE:  Procedure(s): HYSTERECTOMY ABDOMINAL WITH BILATERAL SALPINGO OOPHORECTOMY (Bilateral)  SURGEON:  Surgeon(s) and Role:    * Darlyn Chamber, MD - Primary    * Allena Katz, MD - Assisting  PHYSICIAN ASSISTANT:   ASSISTANTS: tomblin   ANESTHESIA:   local and general  EBL:  Total I/O In: 2000 [I.V.:2000] Out: 950 [Urine:500; Blood:450]  BLOOD ADMINISTERED:none  DRAINS: Urinary Catheter (Foley)   LOCAL MEDICATIONS USED:  MARCAINE     SPECIMEN:  Source of Specimen:  uterus tubes and ovaries  DISPOSITION OF SPECIMEN:  PATHOLOGY  COUNTS:  YES  TOURNIQUET:  * No tourniquets in log *  DICTATION: .Other Dictation: Dictation Number [id number]  PLAN OF CARE: Admit for overnight observation  PATIENT DISPOSITION:  PACU - hemodynamically stable.   Delay start of Pharmacological VTE agent (>24hrs) due to surgical blood loss or risk of bleeding: no

## 2014-04-13 NOTE — Op Note (Signed)
Patient name Wanda Aguilar, [place] DICTATION#  [id number] CSN# [id number]  Darlyn Chamber, MD 04/13/2014 9:08 AM

## 2014-04-13 NOTE — Transfer of Care (Signed)
Immediate Anesthesia Transfer of Care Note  Patient: Wanda Aguilar  Procedure(s) Performed: Procedure(s): HYSTERECTOMY ABDOMINAL WITH BILATERAL SALPINGO OOPHORECTOMY (Bilateral)  Patient Location: PACU  Anesthesia Type:General  Level of Consciousness: awake, alert  and oriented  Airway & Oxygen Therapy: Patient Spontanous Breathing and Patient connected to nasal cannula oxygen  Post-op Assessment: Report given to PACU RN and Post -op Vital signs reviewed and stable  Post vital signs: Reviewed and stable  Complications: No apparent anesthesia complications

## 2014-04-13 NOTE — Anesthesia Postprocedure Evaluation (Signed)
Anesthesia Post Note  Patient: Wanda Aguilar  Procedure(s) Performed: Procedure(s) (LRB): HYSTERECTOMY ABDOMINAL WITH BILATERAL SALPINGO OOPHORECTOMY (Bilateral)  Anesthesia type: GA  Patient location: PACU  Post pain: Pain level controlled  Post assessment: Post-op Vital signs reviewed  Last Vitals:  Filed Vitals:   04/13/14 0910  BP: 119/73  Pulse: 86  Temp: 36.7 C  Resp: 16    Post vital signs: Reviewed  Level of consciousness: sedated  Complications: No apparent anesthesia complications

## 2014-04-13 NOTE — Anesthesia Postprocedure Evaluation (Signed)
Anesthesia Post Note  Patient: Wanda Aguilar  Procedure(s) Performed: Procedure(s) (LRB): HYSTERECTOMY ABDOMINAL WITH BILATERAL SALPINGO OOPHORECTOMY (Bilateral)  Anesthesia type: General  Patient location: Women's Unit  Post pain: Pain level controlled  Post assessment: Post-op Vital signs reviewed  Last Vitals:  Filed Vitals:   04/13/14 1715  BP: 85/56  Pulse: 88  Temp: 36.4 C  Resp: 16    Post vital signs: Reviewed  Level of consciousness: sedated  Complications: No apparent anesthesia complications

## 2014-04-13 NOTE — H&P (Signed)
  History and physical exam unchanged 

## 2014-04-13 NOTE — Op Note (Signed)
Wanda Aguilar, Wanda Aguilar               ACCOUNT NO.:  [id number]  MEDICAL RECORD NO.:  [id number]  LOCATION:  [facility]                          FACILITY:  [place]  PHYSICIAN:  Darlyn Chamber, M.D.   DATE OF BIRTH:  1966-10-03  DATE OF PROCEDURE:  04/13/2014 DATE OF DISCHARGE:                              OPERATIVE REPORT   PREOPERATIVE DIAGNOSIS:  Symptomatic uterine fibroids.  POSTOPERATIVE DIAGNOSIS:  Symptomatic uterine fibroids.  OPERATIVE PROCEDURE:  Total abdominal hysterectomy with bilateral salpingo-oophorectomy.  SURGEON:  Darlyn Chamber, MD  ASSISTANT:  Daleen Bo. Tomblin, MD  ANESTHESIA:  General endotracheal.  ESTIMATED BLOOD LOSS:  450 mL.  PACKS:  None.  DRAINS:  Urethral Foley.  INTRAOPERATIVE BLOOD PLACEMENT:  None.  COMPLICATIONS:  None.  INDICATION:  Dictated in history and physical.  DESCRIPTION OF PROCEDURE:  The patient was taken to the OR and placed in supine position.  After satisfactory level of general endotracheal anesthesia obtained, the abdomen, perineum, and vagina were prepped out Betadine and draped as a sterile field.  A low-transverse skin incision was made with a knife and carried through subcutaneous tissue.  Anterior rectus fascia entered sharply, then incision fashioned laterally. Fascia taken off the muscle superiorly and inferiorly.  Rectus muscles were separated in the midline.  Peritoneum was entered sharply. Incision of the perineum extended both superiorly and inferiorly.  An O'Connor-O'Sullivan retractor was put in place and bowel contents were packed superiorly.  Uterus was enlarged with multiple uterine fibroids. It was elevated through the incision.  We first went to the right side. The right round ligament was clamped, cut, and suture ligated with 0 Vicryl.  We dissected the peritoneum back while isolated the ovarian vasculature.  It was way about the pelvis away from the ureter.  It was isolated, clamped, cut, and secured with a  free tie of suture ligature of 0 Vicryl.  We started developing the bladder flap.  We had a large uterine fibroid in the lower uterine segment on the right side.  Above that we went ahead and clamped the ovarian vasculature, cut it and suture ligated with 0 Vicryl.  We then went to the left side.  Left round ligament was clamped, cut, and suture ligated with 0 Vicryl.  We developed the pedicle for the ovarian vasculature.  The ureter again was well below.  This was clamped, cut, and double ligated with free tie of 0 Vicryl and suture ligature of 0 Vicryl.  The bladder flap was then developed.  On the left side, the uterine vessels were skeletonized, clamped, cut, and suture ligated with 0 Vicryl.  We then excised the uterus from the lower cervical stump.  Cervical stump was secured with a thyroid tenaculum.  Did have some bleeding from the right side, it was brought in control with a clamp and suture ligated with 0 Vicryl.  Next, using the clamp cut and tie technique with suture ligature of 0 Vicryl, the remaining parametrium was separated from the size of lower uterine segment.  The vaginal angles were clamped, cut, and the vaginal mucosa was excised.  The vaginal angles secured with suture ligature of 0 Vicryl.  The intervening vaginal  mucosa was closed with interrupted suture of 0 Vicryl.  Bleeding was noted from the left ovarian pedicle, was brought in control with a clamp and a free tie of 0 Vicryl.  We had some oozing from the cuff, brought under control with the Bovie.  We then irrigated the pelvis, had excellent hemostasis.  Clear urine output was noted.  Ureters were easily palpable on both sides and well out of the way of the surgical procedure.  Appendix was visualized and noted to be normal.  All packs were removed along with the self-retaining retractor.  The peritoneum was closed with a running suture of 2-0 Vicryl.  Fascia was closed with a running suture of 0 PDS.  Using  2-0 plain, the fat was brought together, skin was closed with running subcuticular 4-0 Monocryl.  We did inject approximately 60 mL of Exparel below the fascia in the fatty tissue.  Dermabond was also applied in the incision.  At this point in time, we had good clear urine output. Sponge, instrument and needle count was correct by circulating nurse x2. The patient tolerated the procedure well and went to recovery in good Condition.  Uterus tubes and ovaries weighed 927.0 grams     Darlyn Chamber, M.D.     JSM/MEDQ  D:  04/13/2014  T:  04/13/2014  Job:  [id number]

## 2014-04-13 NOTE — Addendum Note (Signed)
Addendum created 04/13/14 1738 by Asher Muir, CRNA   Modules edited: Notes Section   Notes Section:  File: [id number]

## 2014-04-13 NOTE — Progress Notes (Signed)
Patient ID: Wanda Aguilar, female   DOB: [birth year], 48 y.o.   MRN: [id number] AF VSS NO NAUSEA DRESSING DRY GOOD UO

## 2014-04-14 ENCOUNTER — Encounter (HOSPITAL_COMMUNITY): Payer: Self-pay | Admitting: Obstetrics and Gynecology

## 2014-04-14 DIAGNOSIS — Z9079 Acquired absence of other genital organ(s): Secondary | ICD-10-CM

## 2014-04-14 DIAGNOSIS — Z90722 Acquired absence of ovaries, bilateral: Secondary | ICD-10-CM

## 2014-04-14 DIAGNOSIS — Z90710 Acquired absence of both cervix and uterus: Secondary | ICD-10-CM

## 2014-04-14 LAB — CBC
HEMATOCRIT: 30.6 % — AB (ref 36.0–46.0)
HEMOGLOBIN: 10.2 g/dL — AB (ref 12.0–15.0)
MCH: 31.4 pg (ref 26.0–34.0)
MCHC: 33.3 g/dL (ref 30.0–36.0)
MCV: 94.2 fL (ref 78.0–100.0)
Platelets: 204 10*3/uL (ref 150–400)
RBC: 3.25 MIL/uL — ABNORMAL LOW (ref 3.87–5.11)
RDW: 13 % (ref 11.5–15.5)
WBC: 8.6 10*3/uL (ref 4.0–10.5)

## 2014-04-14 NOTE — Progress Notes (Signed)
Ur chart review completed.  

## 2014-04-14 NOTE — Progress Notes (Signed)
1 Day Post-Op Procedure(s) (LRB): HYSTERECTOMY ABDOMINAL WITH BILATERAL SALPINGO OOPHORECTOMY (Bilateral)  Subjective: Patient reports incisional pain, tolerating PO and no problems voiding.    Objective: I have reviewed patient's vital signs and labs.  General: alert GI: soft, non-tender; bowel sounds normal; no masses,  no organomegaly and incision: clean Vaginal Bleeding: minimal  Assessment: s/p Procedure(s): HYSTERECTOMY ABDOMINAL WITH BILATERAL SALPINGO OOPHORECTOMY (Bilateral): stable  Plan: Advance diet  LOS: 1 day    Darlyn Chamber 04/14/2014, 8:12 AM

## 2014-04-15 MED ORDER — OXYCODONE-ACETAMINOPHEN 7.5-325 MG PO TABS: 1.0000 | ORAL_TABLET | ORAL | Status: DC | PRN

## 2014-04-15 NOTE — Progress Notes (Signed)
2 Days Post-Op Procedure(s) (LRB): HYSTERECTOMY ABDOMINAL WITH BILATERAL SALPINGO OOPHORECTOMY (Bilateral)  Subjective: Patient reports tolerating PO, + flatus and no problems voiding.    Objective: I have reviewed patient's vital signs.  General: alert GI: soft, non-tender; bowel sounds normal; no masses,  no organomegaly and incision cleart  Assessment: s/p Procedure(s): HYSTERECTOMY ABDOMINAL WITH BILATERAL SALPINGO OOPHORECTOMY (Bilateral): progressing well and tolerating diet  Plan: Discharge home  LOS: 2 days    Wanda Aguilar 04/15/2014, 8:34 AM

## 2014-04-15 NOTE — Progress Notes (Signed)
Pt is discharged on the care ofhusband. Downstairs per ambulatory with [employer]. [employer].Denies any pain or discomfort. Discharge instructions with Rx were given to Pt. Questions were asked and answered. No equipment for home use.Abd. Incision is clean and dry.

## 2014-04-15 NOTE — Discharge Summary (Signed)
  Patient name  Tanica, Gaige DICTATION#  [id number] CSN# [id number]  Darlyn Chamber, MD 04/15/2014 8:37 AM

## 2014-04-15 NOTE — Discharge Summary (Signed)
NAMEDIAVIAN, Wanda Aguilar               ACCOUNT NO.:  [id number]  MEDICAL RECORD NO.:  [id number]  LOCATION:  5465                          FACILITY:  [facility]  PHYSICIAN:  Darlyn Chamber, M.D.   DATE OF BIRTH:  1966-01-10  DATE OF ADMISSION:  04/13/2014 DATE OF DISCHARGE:  04/15/2014                              DISCHARGE SUMMARY   ADMITTING DIAGNOSES:  Symptomatic uterine fibroids.  DISCHARGE DIAGNOSIS:  Symptomatic uterine fibroids.  PROCEDURES:  Total abdominal hysterectomy with bilateral salpingo- oophorectomy.  For complete history and physical, please see dictated note.  COURSE IN THE HOSPITAL:  The patient underwent the above-noted surgery. Postoperatively, her hemoglobin was 10.2.  Her IVs were discontinued, and diet was quickly advanced.  On the day of discharge, she was afebrile with stable vital signs.  Abdomen was soft.  Bowel sounds were active.  She was passing flatus.  Incision was clear.  She has had no bleeding difficulties and minimal vaginal spotting.  In terms of complication, none were encountered during her stay in the hospital.  The patient is discharged home in stable condition.  DISPOSITION:  She is to avoid driving a car, heavy lifting, or vaginal entrance.  She is instructed to call with signs of infection in terms of fever.  Should call with any active vaginal bleeding.  Should call with nausea and vomiting.  Should call with increasing uncontrollable pain. Also instructed on signs and symptoms of deep venous thrombosis and pulmonary embolus.  Discharged home on Percocet as needed for pain.  The office will call her tomorrow to arrange followup.     Darlyn Chamber, M.D.     JSM/MEDQ  D:  04/15/2014  T:  04/15/2014  Job:  [id number]

## 2014-09-23 ENCOUNTER — Other Ambulatory Visit: Payer: Self-pay | Admitting: Obstetrics and Gynecology

## 2014-09-24 LAB — CYTOLOGY - PAP

## 2015-04-15 ENCOUNTER — Other Ambulatory Visit: Payer: Self-pay | Admitting: Family Medicine

## 2015-05-26 ENCOUNTER — Other Ambulatory Visit: Payer: Self-pay | Admitting: Family Medicine

## 2015-06-22 ENCOUNTER — Encounter: Payer: Self-pay | Admitting: Family Medicine

## 2015-06-22 ENCOUNTER — Ambulatory Visit (INDEPENDENT_AMBULATORY_CARE_PROVIDER_SITE_OTHER): Payer: BLUE CROSS/BLUE SHIELD | Admitting: Family Medicine

## 2015-06-22 VITALS — BP 130/90 | HR 77 | Temp 98.3°F | Wt 174.0 lb

## 2015-06-22 DIAGNOSIS — R635 Abnormal weight gain: Secondary | ICD-10-CM | POA: Diagnosis not present

## 2015-06-22 DIAGNOSIS — F411 Generalized anxiety disorder: Secondary | ICD-10-CM | POA: Diagnosis not present

## 2015-06-22 DIAGNOSIS — R7989 Other specified abnormal findings of blood chemistry: Secondary | ICD-10-CM

## 2015-06-22 DIAGNOSIS — R432 Parageusia: Secondary | ICD-10-CM | POA: Diagnosis not present

## 2015-06-22 DIAGNOSIS — I10 Essential (primary) hypertension: Secondary | ICD-10-CM

## 2015-06-22 LAB — CBC WITH DIFFERENTIAL/PLATELET
Basophils Absolute: 0 10*3/uL (ref 0.0–0.1)
Basophils Relative: 0.4 % (ref 0.0–3.0)
Eosinophils Absolute: 0.1 10*3/uL (ref 0.0–0.7)
Eosinophils Relative: 2.1 % (ref 0.0–5.0)
HEMATOCRIT: 41.9 % (ref 36.0–46.0)
Hemoglobin: 14.4 g/dL (ref 12.0–15.0)
LYMPHS PCT: 37.2 % (ref 12.0–46.0)
Lymphs Abs: 2.1 10*3/uL (ref 0.7–4.0)
MCHC: 34.4 g/dL (ref 30.0–36.0)
MCV: 91.2 fl (ref 78.0–100.0)
MONO ABS: 0.4 10*3/uL (ref 0.1–1.0)
Monocytes Relative: 7.5 % (ref 3.0–12.0)
Neutro Abs: 3 10*3/uL (ref 1.4–7.7)
Neutrophils Relative %: 52.8 % (ref 43.0–77.0)
Platelets: 249 10*3/uL (ref 150.0–400.0)
RBC: 4.59 Mil/uL (ref 3.87–5.11)
RDW: 12.7 % (ref 11.5–15.5)
WBC: 5.7 10*3/uL (ref 4.0–10.5)

## 2015-06-22 LAB — COMPREHENSIVE METABOLIC PANEL
ALBUMIN: 4.5 g/dL (ref 3.5–5.2)
ALT: 29 U/L (ref 0–35)
AST: 21 U/L (ref 0–37)
Alkaline Phosphatase: 83 U/L (ref 39–117)
BILIRUBIN TOTAL: 0.3 mg/dL (ref 0.2–1.2)
BUN: 17 mg/dL (ref 6–23)
CO2: 30 mEq/L (ref 19–32)
Calcium: 9.5 mg/dL (ref 8.4–10.5)
Chloride: 103 mEq/L (ref 96–112)
Creatinine, Ser: 0.86 mg/dL (ref 0.40–1.20)
GFR: 74.54 mL/min (ref >60.00)
Glucose, Bld: 123 mg/dL — ABNORMAL HIGH (ref 70–99)
Potassium: 3.8 mEq/L (ref 3.5–5.1)
Sodium: 141 mEq/L (ref 135–145)
Total Protein: 7.1 g/dL (ref 6.0–8.3)

## 2015-06-22 LAB — VITAMIN B12: Vitamin B-12: 188 pg/mL — ABNORMAL LOW (ref 211–911)

## 2015-06-22 LAB — LIPID PANEL
Cholesterol: 204 mg/dL — ABNORMAL HIGH (ref 0–200)
HDL: 37.7 mg/dL — ABNORMAL LOW (ref >39.00)
NonHDL: 166.3
Total CHOL/HDL Ratio: 5
Triglycerides: 288 mg/dL — ABNORMAL HIGH (ref 0.0–149.0)
VLDL: 57.6 mg/dL — AB (ref 0.0–40.0)

## 2015-06-22 LAB — LDL CHOLESTEROL, DIRECT: Direct LDL: 96 mg/dL

## 2015-06-22 LAB — H. PYLORI ANTIBODY, IGG: H Pylori IgG: NEGATIVE

## 2015-06-22 LAB — TSH: TSH: 1.05 u[IU]/mL (ref 0.35–4.50)

## 2015-06-22 MED ORDER — LOSARTAN POTASSIUM-HCTZ 100-12.5 MG PO TABS: 1.0000 | ORAL_TABLET | Freq: Every day | ORAL | Status: DC

## 2015-06-22 NOTE — Progress Notes (Signed)
Pre visit review using our clinic review tool, if applicable. No additional management support is needed unless otherwise documented below in the visit note. 

## 2015-06-22 NOTE — Assessment & Plan Note (Signed)
Advised keeping journal of diet and exercise. Check thyroid function.

## 2015-06-22 NOTE — Assessment & Plan Note (Signed)
Deteriorated but she feels she does not need SSRI, other rx or psychotherapy.

## 2015-06-22 NOTE — Assessment & Plan Note (Signed)
New- ? GERD. Advised prilosec 20 mg daily for 2 weeks. She will call me with an update. Also check labs today.  Orders Placed This Encounter  Procedures  . CBC with Differential/Platelet  . Comprehensive metabolic panel  . Lipid panel  . TSH  . Vitamin B12  . H. pylori antibody, IgG

## 2015-06-22 NOTE — Patient Instructions (Signed)
Great to see you.  Try OTC Pepcid 20 mg daily for 2 weeks.  Then call me.  We will call you with your lab results.

## 2015-06-22 NOTE — Progress Notes (Signed)
Subjective:   Patient ID: Wanda Aguilar, female    DOB: 01/29/1966, 49 y.o.   MRN: [id number]  Wanda Aguilar is a pleasant 49 y.o. year old female who presents to clinic today with Medication Refill  with multiple complaints on 06/22/2015  HPI:  HTN- currently taking Hyzaar 100-12.5 mg daily. Denies HA, blurred vision, CP or SOB. No LE edema.  Lab Results  Component Value Date   CREATININE 0.61 04/09/2014    BP Readings from Last 3 Encounters:  06/22/15 130/90  04/15/14 130/81  04/09/14 142/94   Anxiety- stopped taking the zoloft.  Feels she is less anxious but during our conversation she tells me she is anxious about several things:  Weight gain- not exercising but feels she should not weigh as much as she does.  She thinks it's menopause.   Wt Readings from Last 3 Encounters:  06/22/15 174 lb (78.926 kg)  04/13/14 165 lb (74.844 kg)  04/09/14 165 lb (74.844 kg)   Sour taste in her mouth- talked with her dentist who said her mouth looked fine.  Upon questioning, she does sometimes have some reflux symptoms.  No current outpatient prescriptions on file prior to visit.   No current facility-administered medications on file prior to visit.    No Known Allergies  Past Medical History  Diagnosis Date  . Hypertension   . Fibroids   . Frequent headaches   . Urine incontinence     Past Surgical History  Procedure Laterality Date  . Ceaserean    . Carpal tunnel release      Right Hand  . Ganglion cyst excision      Bilateral  . Cesarean section    . Tooth extraction    . Abdominal hysterectomy Bilateral 04/13/2014    Procedure: HYSTERECTOMY ABDOMINAL WITH BILATERAL SALPINGO OOPHORECTOMY;  Surgeon: Darlyn Chamber, MD;  Location: [facility];  Service: Gynecology;  Laterality: Bilateral;    Family History  Problem Relation Age of Onset  . Diabetes Father   . Diabetes Paternal Aunt   . Diabetes Paternal Uncle   . Arthritis Maternal Grandmother   . Alcohol abuse  Maternal Grandfather   . Diabetes Paternal Grandmother   . Diabetes Paternal Grandfather     History   Social History  . Marital Status: Divorced    Spouse Name: N/A  . Number of Children: N/A  . Years of Education: N/A   Occupational History  . Not on file.   Social History Main Topics  . Smoking status: Former Smoker -- 0.30 packs/day for 4 years    Types: Cigarettes    Start date: 01/14/2009  . Smokeless tobacco: Never Used  . Alcohol Use: No  . Drug Use: No  . Sexual Activity: Yes   Other Topics Concern  . Not on file   Social History Narrative   The PMH, PSH, Social History, Family History, Medications, and allergies have been reviewed in [facility], and have been updated if relevant.   Review of Systems  Constitutional: Positive for unexpected weight change.  HENT: Negative for sore throat and trouble swallowing.   Eyes: Negative.   Respiratory: Negative.   Cardiovascular: Negative.   Gastrointestinal: Negative.   Endocrine: Positive for heat intolerance.  Musculoskeletal: Negative.   Skin: Negative.   Allergic/Immunologic: Negative.   Neurological: Negative.   Hematological: Negative.   Psychiatric/Behavioral: Negative.   All other systems reviewed and are negative.      Objective:    BP 130/90  mmHg  Pulse 77  Temp(Src) 98.3 F (36.8 C) (Tympanic)  Wt 174 lb (78.926 kg)  SpO2 98%   Physical Exam    General:  Well-developed,well-nourished,in no acute distress; alert,appropriate and cooperative throughout examination Head:  normocephalic and atraumatic.   Eyes:  vision grossly intact, pupils equal, pupils round, and pupils reactive to light.   Ears:  R ear normal and L ear normal.   Nose:  no external deformity.   Mouth:  good dentition.   Lungs:  Normal respiratory effort, chest expands symmetrically. Lungs are clear to auscultation, no crackles or wheezes. Heart:  Normal rate and regular rhythm. S1 and S2 normal without gallop, murmur, click,  rub or other extra sounds. Abdomen:  Bowel sounds positive,abdomen soft and non-tender without masses, organomegaly or hernias noted. Msk:  No deformity or scoliosis noted of thoracic or lumbar spine.   Extremities:  No clubbing, cyanosis, edema, or deformity noted with normal full range of motion of all joints.   Neurologic:  alert & oriented X3 and gait normal.   Skin:  Intact without suspicious lesions or rashes Cervical Nodes:  No lymphadenopathy noted Psych:  Cognition and judgment appear intact. Alert and cooperative with normal attention span and concentration. No apparent delusions, illusions, hallucinations      Assessment & Plan:   Essential hypertension  Anxiety state No Follow-up on file.

## 2015-06-22 NOTE — Assessment & Plan Note (Signed)
Well controlled on current rx. No changes made- eRx sent. Labs today.

## 2015-09-20 ENCOUNTER — Ambulatory Visit: Payer: BLUE CROSS/BLUE SHIELD | Admitting: Family Medicine

## 2015-12-19 ENCOUNTER — Encounter: Payer: Self-pay | Admitting: Emergency Medicine

## 2015-12-19 ENCOUNTER — Ambulatory Visit (INDEPENDENT_AMBULATORY_CARE_PROVIDER_SITE_OTHER): Payer: BLUE CROSS/BLUE SHIELD

## 2015-12-19 ENCOUNTER — Ambulatory Visit
Admission: EM | Admit: 2015-12-19 | Discharge: 2015-12-19 | Disposition: A | Discharge status: Home / Self Care | Payer: BLUE CROSS/BLUE SHIELD | Attending: Family Medicine | Admitting: Family Medicine

## 2015-12-19 DIAGNOSIS — J40 Bronchitis, not specified as acute or chronic: Secondary | ICD-10-CM | POA: Diagnosis not present

## 2015-12-19 DIAGNOSIS — J069 Acute upper respiratory infection, unspecified: Secondary | ICD-10-CM | POA: Diagnosis not present

## 2015-12-19 LAB — RAPID INFLUENZA A&B ANTIGENS
Influenza A (ARMC): NOT DETECTED
Influenza B (ARMC): NOT DETECTED

## 2015-12-19 MED ORDER — GUAIFENESIN-CODEINE 100-10 MG/5ML PO SOLN: 10.0000 mL | Freq: Three times a day (TID) | ORAL | Status: DC | PRN

## 2015-12-19 MED ORDER — AZITHROMYCIN 250 MG PO TABS: ORAL_TABLET | ORAL | Status: DC

## 2015-12-19 NOTE — Discharge Instructions (Signed)
Take medication as prescribed. Rest. Drink plenty of fluids. Take over the counter tylenol or ibuprofen as needed.   Follow up closely with your primary care physician this week as needed. Return to Urgent care for new or worsening concerns.   Upper Respiratory Infection, Adult Most upper respiratory infections (URIs) are caused by a virus. A URI affects the nose, throat, and upper air passages. The most common type of URI is often called "the common cold." HOME CARE   Take medicines only as told by your doctor.  Gargle warm saltwater or take cough drops to comfort your throat as told by your doctor.  Use a warm mist humidifier or inhale steam from a shower to increase air moisture. This may make it easier to breathe.  Drink enough fluid to keep your pee (urine) clear or pale yellow.  Eat soups and other clear broths.  Have a healthy diet.  Rest as needed.  Go back to work when your fever is gone or your doctor says it is okay.  You may need to stay home longer to avoid giving your URI to others.  You can also wear a face mask and wash your hands often to prevent spread of the virus.  Use your inhaler more if you have asthma.  Do not use any tobacco products, including cigarettes, chewing tobacco, or electronic cigarettes. If you need help quitting, ask your doctor. GET HELP IF:  You are getting worse, not better.  Your symptoms are not helped by medicine.  You have chills.  You are getting more short of breath.  You have brown or red mucus.  You have yellow or brown discharge from your nose.  You have pain in your face, especially when you bend forward.  You have a fever.  You have puffy (swollen) neck glands.  You have pain while swallowing.  You have white areas in the back of your throat. GET HELP RIGHT AWAY IF:   You have very bad or constant:  Headache.  Ear pain.  Pain in your forehead, behind your eyes, and over your cheekbones (sinus  pain).  Chest pain.  You have long-lasting (chronic) lung disease and any of the following:  Wheezing.  Long-lasting cough.  Coughing up blood.  A change in your usual mucus.  You have a stiff neck.  You have changes in your:  Vision.  Hearing.  Thinking.  Mood. MAKE SURE YOU:   Understand these instructions.  Will watch your condition.  Will get help right away if you are not doing well or get worse.   This information is not intended to replace advice given to you by your health care provider. Make sure you discuss any questions you have with your health care provider.   Document Released: 05/28/2008 Document Revised: 04/26/2015 Document Reviewed: 03/17/2014 Elsevier Interactive Patient Education [employer].

## 2015-12-19 NOTE — ED Notes (Signed)
Severe cough, feels like she is punched in the ribs. Body ache, chills, fever, no appetite and headache  for 4 dayas

## 2015-12-19 NOTE — ED Provider Notes (Signed)
[facility]  ____________________________________________  Time seen: Approximately 11:02 AM  I have reviewed the triage vital signs and the nursing notes.   HISTORY  Chief Complaint Cough   HPI Wanda Aguilar is a 49 y.o. female presents for complaint of cough, congestion, body aches and runny nose for 4-5 days. Patient reports that she feels that she coughs throughout the day. Patient states that since the last 2-3 days when she coughs her ribs on both sides hurt, but reports if she is not coughing there is no pain. States pain maximum 5 out of 10 aching.  Denies chest pain, shortness of breath, abdominal pain, dizziness, neck or back pain. Denies chest pain or rib pain with deep breath. Reports continues to eat and drink well. 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 Denies sick contacts at home.  States cough is primarily a dry and nonproductive cough.   Past Medical History  Diagnosis Date  . Hypertension   . Fibroids   . Frequent headaches   . Urine incontinence     Patient Active Problem List   Diagnosis Date Noted  . Abnormal taste in mouth 06/22/2015  . S/P TAH-BSO (total abdominal hysterectomy and bilateral salpingo-oophorectomy) 04/14/2014  . S/P total abdominal hysterectomy and bilateral salpingo-oophorectomy 04/13/2014  . Anxiety state 03/04/2014  . HTN (hypertension) 02/12/2014  . Fibroids 02/12/2014  . Weight gain 02/12/2014    Past Surgical History  Procedure Laterality Date  . Ceaserean    . Carpal tunnel release      Right Hand  . Ganglion cyst excision      Bilateral  . Cesarean section    . Tooth extraction    . Abdominal hysterectomy Bilateral 04/13/2014    Procedure: HYSTERECTOMY ABDOMINAL WITH BILATERAL SALPINGO OOPHORECTOMY;  Surgeon: Darlyn Chamber, MD;  Location: [facility];  Service: Gynecology;  Laterality: Bilateral;    Current Outpatient Rx  Name  Route  Sig  Dispense   Refill  .           . fluticasone (FLONASE) 50 MCG/ACT nasal spray   Each Nare   Place 2 sprays into both nostrils daily.         [name] losartan-hydrochlorothiazide (HYZAAR) 100-12.5 MG per tablet   Oral   Take 1 tablet by mouth daily.   90 tablet   3     Allergies Review of patient's allergies indicates no known allergies.  Family History  Problem Relation Age of Onset  . Diabetes Father   . Diabetes Paternal Aunt   . Diabetes Paternal Uncle   . Arthritis Maternal Grandmother   . Alcohol abuse Maternal Grandfather   . Diabetes Paternal Grandmother   . Diabetes Paternal Grandfather     Social History Social History  Substance Use Topics  . Smoking status: Former Smoker -- 0.30 packs/day for 4 years    Types: Cigarettes    Start date: 01/14/2009  . Smokeless tobacco: Never Used  . Alcohol Use: No    Review of Systems Constitutional: Positive for warm and cold chills. Reports fever last night 101 orally. Eyes: No visual changes. ENT: No sore throat. Positive runny nose, nasal congestion, cough, sinus discomfort. Cardiovascular: Denies chest pain. Respiratory: Denies shortness of breath. Gastrointestinal: No abdominal pain.  No nausea, no vomiting.  No diarrhea.  No constipation. Genitourinary: Negative for dysuria. Musculoskeletal: Negative for back pain. Skin: Negative for rash. Neurological: Negative for headaches, focal weakness or  numbness.  10-point ROS otherwise negative.  ____________________________________________   PHYSICAL EXAM:  VITAL SIGNS: ED Triage Vitals  Enc Vitals Group     BP 12/19/15 1042 147/91 mmHg     Pulse Rate 12/19/15 1042 80     Resp 12/19/15 1042 16     Temp 12/19/15 1042 98.5 F (36.9 C)     Temp Source 12/19/15 1042 Tympanic     SpO2 12/19/15 1042 98 %     Weight 12/19/15 1042 165 lb (74.844 kg)     Height 12/19/15 1042 5\' 3"  (1.6 m)     Head Cir --      Peak Flow --      Pain Score 12/19/15 1042 9     Pain Loc --       Pain Edu? --      Excl. in [place]? --     Constitutional: Alert and oriented. Well appearing and in no acute distress. Eyes: Conjunctivae are normal. PERRL. EOMI. Head: Atraumatic. Mild tenderness to palpation bilateral frontal and maxillary sinuses. No swelling. No erythema.  Ears: no erythema, normal TMs bilaterally.   Nose: Nasal and congestion with bilateral nasal turbinate erythema.  Mouth/Throat: Mucous membranes are moist.  Oropharynx non-erythematous. No tonsillar swelling or exudate. Neck: No stridor.  No cervical spine tenderness to palpation. Hematological/Lymphatic/Immunilogical: No cervical lymphadenopathy. Cardiovascular and chest: Normal rate, regular rhythm. Grossly normal heart sounds.  Good peripheral circulation. Mild tenderness to palpation bilateral lateral ribs at midaxillary line, no ecchymosis, no palpable deformity. Respiratory: Normal respiratory effort.  No retractions. Mild scattered rhonchi. No wheezes or rales. Good air movement. Gastrointestinal: Soft and nontender. No distention. Normal Bowel sounds.   Musculoskeletal: No lower or upper extremity tenderness nor edema.  Bilateral pedal pulses equal and easily palpated.  Neurologic:  Normal speech and language. No gross focal neurologic deficits are appreciated. No gait instability. Skin:  Skin is warm, dry and intact. No rash noted. Psychiatric: Mood and affect are normal. Speech and behavior are normal.  ____________________________________________   LABS (all labs ordered are listed, but only abnormal results are displayed)  Labs Reviewed  RAPID INFLUENZA A&B ANTIGENS ([place])    RADIOLOGY  EXAM: CHEST 2 VIEW  COMPARISON: March 28, 2007  FINDINGS: The heart size and mediastinal contours are within normal limits. The aorta is tortuous. There is no focal infiltrate, pulmonary edema, or pleural effusion. The visualized skeletal structures are unremarkable.  IMPRESSION: No active cardiopulmonary  disease.   Electronically Signed By: Abelardo Diesel M.D. On: 12/19/2015 11:48  I, Marylene Land, personally viewed and evaluated these images (plain radiographs) as part of my medical decision making, as well as reviewing the written report by the radiologist.    [place] / [place] / ED COURSE  Pertinent labs & imaging results that were available during my care of the patient were reviewed by me and considered in my medical decision making (see chart for details).  Well-appearing patient. No acute distress. Presents for the next 4-5 days of runny nose, nasal congestion, sinus drainage and intermittent cough. Reports intermittent generalized body aches. Reports continues to eat and drink well. Lungs scattered rhonchi, no focal area of consolidation. Also reports intermittent bilateral rib pain with coughing, which is point reproducible by palpation. Denies chest pain or shortness of breath. Will evaluate by chest x-ray. Influenza negative.  Chest x-ray no active cardio pulmonary disease per radiologist. As with patient 4-5 days of symptoms with subjective fevers will treat upper respiratory  infection and bronchitis with oral azithromycin and when necessary guaifenesin codeine. Encouraged fluids, rest, over-the-counter Tylenol or ibuprofen. Encouraged PCP follow up.Woke note given for today and tomorrow.    Discussed follow up with Primary care physician this week. Discussed follow up and return parameters including no resolution or any worsening concerns. Patient verbalized understanding and agreed to plan.   ____________________________________________   FINAL CLINICAL IMPRESSION(S) / ED DIAGNOSES  Final diagnoses:  Bronchitis  Upper respiratory infection       Marylene Land, NP 12/19/15 1233  Marylene Land, NP 12/19/15 1234

## 2016-08-02 ENCOUNTER — Other Ambulatory Visit: Payer: Self-pay | Admitting: Family Medicine

## 2016-09-07 ENCOUNTER — Other Ambulatory Visit: Payer: Self-pay | Admitting: Family Medicine

## 2016-09-17 ENCOUNTER — Emergency Department (HOSPITAL_COMMUNITY): Payer: BLUE CROSS/BLUE SHIELD

## 2016-09-17 ENCOUNTER — Encounter (HOSPITAL_COMMUNITY): Payer: Self-pay

## 2016-09-17 ENCOUNTER — Emergency Department (HOSPITAL_COMMUNITY)
Admission: EM | Admit: 2016-09-17 | Discharge: 2016-09-17 | Disposition: A | Discharge status: Home / Self Care | Payer: BLUE CROSS/BLUE SHIELD | Attending: Emergency Medicine | Admitting: Emergency Medicine

## 2016-09-17 DIAGNOSIS — Z5321 Procedure and treatment not carried out due to patient leaving prior to being seen by health care provider: Secondary | ICD-10-CM | POA: Insufficient documentation

## 2016-09-17 DIAGNOSIS — R079 Chest pain, unspecified: Secondary | ICD-10-CM | POA: Diagnosis not present

## 2016-09-17 DIAGNOSIS — F17210 Nicotine dependence, cigarettes, uncomplicated: Secondary | ICD-10-CM | POA: Insufficient documentation

## 2016-09-17 DIAGNOSIS — I10 Essential (primary) hypertension: Secondary | ICD-10-CM | POA: Diagnosis not present

## 2016-09-17 LAB — BASIC METABOLIC PANEL
ANION GAP: 13 (ref 5–15)
BUN: 9 mg/dL (ref 6–20)
CHLORIDE: 101 mmol/L (ref 101–111)
CO2: 24 mmol/L (ref 22–32)
Calcium: 10.2 mg/dL (ref 8.9–10.3)
Creatinine, Ser: 0.63 mg/dL (ref 0.44–1.00)
Glucose, Bld: 106 mg/dL — ABNORMAL HIGH (ref 65–99)
POTASSIUM: 3.4 mmol/L — AB (ref 3.5–5.1)
SODIUM: 138 mmol/L (ref 135–145)

## 2016-09-17 LAB — CBC
HCT: 44 % (ref 36.0–46.0)
Hemoglobin: 15.3 g/dL — ABNORMAL HIGH (ref 12.0–15.0)
MCH: 31.9 pg (ref 26.0–34.0)
MCHC: 34.8 g/dL (ref 30.0–36.0)
MCV: 91.7 fL (ref 78.0–100.0)
PLATELETS: 253 10*3/uL (ref 150–400)
RBC: 4.8 MIL/uL (ref 3.87–5.11)
RDW: 12.3 % (ref 11.5–15.5)
WBC: 9.3 10*3/uL (ref 4.0–10.5)

## 2016-09-17 LAB — I-STAT TROPONIN, ED: TROPONIN I, POC: 0 ng/mL (ref 0.00–0.08)

## 2016-09-17 NOTE — ED Notes (Signed)
Pt does not want to wait. Pt has left.

## 2016-09-17 NOTE — ED Triage Notes (Signed)
Pt rep orts chest pain that has gotten worse since Saturday. Pt reports hearing a "roaring" in her ears. Pt also reports hypertension. She reports increased stress recently as well.

## 2016-09-18 ENCOUNTER — Telehealth: Payer: Self-pay

## 2016-09-18 MED ORDER — SERTRALINE HCL 25 MG PO TABS: 25.0000 mg | ORAL_TABLET | Freq: Every day | ORAL | 1 refills | Status: DC

## 2016-09-18 NOTE — Telephone Encounter (Signed)
Pt left v/m; seen in [place] on 09/17/16. Pt did not schedule ED f/u until 10/11/16 because pt wanted 7:30 appt. Last pt seen 06/22/15. Pt wants to know if would start sertraline to see how pt responds until pt is seen. Pt request cb.

## 2016-09-18 NOTE — Telephone Encounter (Signed)
Yes restarting sertraline is reasonable since she was previously taking it.  eRx sent to pharmacy on file.  Please keep appt.

## 2016-10-11 ENCOUNTER — Ambulatory Visit (INDEPENDENT_AMBULATORY_CARE_PROVIDER_SITE_OTHER): Payer: BLUE CROSS/BLUE SHIELD | Admitting: Family Medicine

## 2016-10-11 ENCOUNTER — Encounter: Payer: Self-pay | Admitting: *Deleted

## 2016-10-11 ENCOUNTER — Encounter: Payer: Self-pay | Admitting: Family Medicine

## 2016-10-11 VITALS — BP 124/82 | HR 79 | Temp 97.7°F | Wt 178.5 lb

## 2016-10-11 DIAGNOSIS — R079 Chest pain, unspecified: Secondary | ICD-10-CM

## 2016-10-11 DIAGNOSIS — F411 Generalized anxiety disorder: Secondary | ICD-10-CM

## 2016-10-11 DIAGNOSIS — G4700 Insomnia, unspecified: Secondary | ICD-10-CM | POA: Diagnosis not present

## 2016-10-11 MED ORDER — TRAZODONE HCL 50 MG PO TABS: 25.0000 mg | ORAL_TABLET | Freq: Every evening | ORAL | 3 refills | Status: DC | PRN

## 2016-10-11 MED ORDER — LOSARTAN POTASSIUM-HCTZ 100-12.5 MG PO TABS: 1.0000 | ORAL_TABLET | Freq: Every day | ORAL | 6 refills | Status: DC

## 2016-10-11 NOTE — Assessment & Plan Note (Signed)
Resolved.  Likely secondary to anxiety. Call or return to clinic prn if these symptoms worsen or fail to improve as anticipated. The patient indicates understanding of these issues and agrees with the plan.

## 2016-10-11 NOTE — Assessment & Plan Note (Signed)
New- likely situational. eRx sent for trazodone.

## 2016-10-11 NOTE — Assessment & Plan Note (Signed)
Deteriorated. Advised to start zoloft.  She will call me in a few weeks with an update.

## 2016-10-11 NOTE — Progress Notes (Signed)
Pre visit review using our clinic review tool, if applicable. No additional management support is needed unless otherwise documented below in the visit note. 

## 2016-10-11 NOTE — Progress Notes (Signed)
Subjective:   Patient ID: Wanda Aguilar, female    DOB: [birth year], 50 y.o.   MRN: [id number]  Wanda Aguilar is a pleasant 50 y.o. year old female who presents to clinic today with Follow-up  on 10/11/2016  HPI:  Was seen in ER on 09/18/16 but I do not see notes from a provider.  Other notes and studies reviewed.  Presented with chest pain, increased stress.  EKG, I state troponin, CBC, BMET unremarkable.  CXR neg.  She left ER before being seen once labs and EKG were neg in triage.  She did call our office on 9/26 asking to restart zoloft since she has been under more stress and experiencing more symptoms of anxiety.  eRx sent was sent to her pharmacy on that day.  She has not restarted zoloft.  Wanted to talk to me first. Has been very anxious, under a lot of stress. Does not feel depressed.  Not sleeping well.   HTN- has been well controlled on Hyzaar.   Current Outpatient Prescriptions on File Prior to Visit  Medication Sig Dispense Refill  . azelastine (ASTELIN) 0.1 % nasal spray Place 1 spray into both nostrils 2 (two) times daily. Use in each nostril as directed    . fluticasone (FLONASE) 50 MCG/ACT nasal spray Place 2 sprays into both nostrils daily.    [name] losartan-hydrochlorothiazide (HYZAAR) 100-12.5 MG tablet Take 1 tablet by mouth daily. COMPLETE PHYSICAL EXAM REQUIRED FOR ADDITIONAL REFILLS 30 tablet 0  . sertraline (ZOLOFT) 25 MG tablet Take 1 tablet (25 mg total) by mouth daily. (Patient not taking: Reported on 10/11/2016) 30 tablet 1   No current facility-administered medications on file prior to visit.     No Known Allergies  Past Medical History:  Diagnosis Date  . Fibroids   . Frequent headaches   . Hypertension   . Urine incontinence     Past Surgical History:  Procedure Laterality Date  . ABDOMINAL HYSTERECTOMY Bilateral 04/13/2014   Procedure: HYSTERECTOMY ABDOMINAL WITH BILATERAL SALPINGO OOPHORECTOMY;  Surgeon: Darlyn Chamber, MD;  Location: [facility];  Service: Gynecology;  Laterality: Bilateral;  . CARPAL TUNNEL RELEASE     Right Hand  . Ceaserean    . CESAREAN SECTION    . GANGLION CYST EXCISION     Bilateral  . TOOTH EXTRACTION      Family History  Problem Relation Age of Onset  . Diabetes Father   . Diabetes Paternal Aunt   . Diabetes Paternal Uncle   . Arthritis Maternal Grandmother   . Alcohol abuse Maternal Grandfather   . Diabetes Paternal Grandmother   . Diabetes Paternal Grandfather     Social History   Social History  . Marital status: Divorced    Spouse name: N/A  . Number of children: N/A  . Years of education: N/A   Occupational History  . Not on file.   Social History Main Topics  . Smoking status: Current Some Day Smoker    Packs/day: 0.30    Years: 4.00    Types: Cigarettes    Start date: 01/14/2009  . Smokeless tobacco: Never Used  . Alcohol use No  . Drug use: No  . Sexual activity: Yes   Other Topics Concern  . Not on file   Social History Narrative  . No narrative on file   The PMH, PSH, Social History, Family History, Medications, and allergies have been reviewed in [facility], and have been updated if relevant.  Review of Systems  Constitutional: Negative.   Eyes: Negative.   Respiratory: Negative.   Cardiovascular: Negative.   Psychiatric/Behavioral: Positive for sleep disturbance. Negative for agitation, behavioral problems, confusion, decreased concentration, dysphoric mood, hallucinations, self-injury and suicidal ideas. The patient is nervous/anxious. The patient is not hyperactive.   All other systems reviewed and are negative.      Objective:    BP 124/82   Pulse 79   Temp 97.7 F (36.5 C) (Oral)   Wt 178 lb 8 oz (81 kg)   LMP 03/22/2014   SpO2 96%   BMI 31.62 kg/m    Physical Exam   General:  Well-developed,well-nourished,in no acute distress; alert,appropriate and cooperative throughout examination Head:  normocephalic and atraumatic.   Lungs:  Normal  respiratory effort, chest expands symmetrically. Lungs are clear to auscultation, no crackles or wheezes. Heart:  Normal rate and regular rhythm. S1 and S2 normal without gallop, murmur, click, rub or other extra sounds. Abdomen:  Bowel sounds positive,abdomen soft and non-tender without masses, organomegaly or hernias noted. Msk:  No deformity or scoliosis noted of thoracic or lumbar spine.   Extremities:  No clubbing, cyanosis, edema, or deformity noted with normal full range of motion of all joints.   Neurologic:  alert & oriented X3 and gait normal.   Skin:  Intact without suspicious lesions or rashes Psych:  Tearful, Cognition and judgment appear intact. Alert and cooperative with normal attention span and concentration. No apparent delusions, illusions, hallucinations       Assessment & Plan:   Anxiety state  Chest pain, unspecified type No Follow-up on file.

## 2016-10-11 NOTE — Patient Instructions (Signed)
Good to see you.  Please start taking zoloft every morning and trazodone as needed at bedtime.   Please call me in a few weeks.

## 2016-12-12 ENCOUNTER — Other Ambulatory Visit: Payer: Self-pay | Admitting: Family Medicine

## 2016-12-12 NOTE — Telephone Encounter (Signed)
Last refill 09/18/16 #60 +1, last OV 10/11/16. Ok to refill?

## 2017-04-25 ENCOUNTER — Other Ambulatory Visit: Payer: Self-pay | Admitting: Family Medicine

## 2017-08-31 ENCOUNTER — Other Ambulatory Visit: Payer: Self-pay | Admitting: Family Medicine

## 2017-09-01 ENCOUNTER — Other Ambulatory Visit: Payer: Self-pay | Admitting: Family Medicine

## 2017-09-02 NOTE — Telephone Encounter (Signed)
Last refill 10/11/16 #30 + 3  Last OV 10/11/16 Ok to refill?

## 2017-10-01 ENCOUNTER — Encounter: Payer: Self-pay | Admitting: Family Medicine

## 2017-10-01 ENCOUNTER — Ambulatory Visit (INDEPENDENT_AMBULATORY_CARE_PROVIDER_SITE_OTHER): Payer: BLUE CROSS/BLUE SHIELD | Admitting: Family Medicine

## 2017-10-01 ENCOUNTER — Ambulatory Visit: Payer: BLUE CROSS/BLUE SHIELD | Admitting: Primary Care

## 2017-10-01 DIAGNOSIS — R05 Cough: Secondary | ICD-10-CM | POA: Diagnosis not present

## 2017-10-01 DIAGNOSIS — H6591 Unspecified nonsuppurative otitis media, right ear: Secondary | ICD-10-CM

## 2017-10-01 DIAGNOSIS — R059 Cough, unspecified: Secondary | ICD-10-CM

## 2017-10-01 MED ORDER — SERTRALINE HCL 25 MG PO TABS: 25.0000 mg | ORAL_TABLET | Freq: Every day | ORAL | 1 refills | Status: DC

## 2017-10-01 MED ORDER — AMOXICILLIN 500 MG PO CAPS: 1000.0000 mg | ORAL_CAPSULE | Freq: Two times a day (BID) | ORAL | 0 refills | Status: DC

## 2017-10-01 MED ORDER — LOSARTAN POTASSIUM-HCTZ 100-12.5 MG PO TABS: 1.0000 | ORAL_TABLET | Freq: Every day | ORAL | 1 refills | Status: DC

## 2017-10-01 NOTE — Assessment & Plan Note (Addendum)
Treat  With amox  10 days. Nasal saline, mucinex DM.

## 2017-10-01 NOTE — Assessment & Plan Note (Signed)
Likely due to congestion and PND.. Lungs clear on exam.

## 2017-10-01 NOTE — Addendum Note (Signed)
Addended by: Eliezer Lofts E on: 10/01/2017 04:05 PM   Modules accepted: Orders

## 2017-10-01 NOTE — Progress Notes (Signed)
   Subjective:    Patient ID: Wanda Aguilar, female    DOB: 1966-08-23, 51 y.o.   MRN: [id number]  Cough  This is a new problem. The current episode started in the past 7 days (5 days). The problem has been gradually worsening. The cough is productive of sputum (clear sputum). Associated symptoms include headaches, nasal congestion, a sore throat and shortness of breath. Pertinent negatives include no chills, ear congestion, ear pain, fever or wheezing. Associated symptoms comments: dizziness off and on  cannot get a deep breath  fatigue. The symptoms are aggravated by lying down. Risk factors for lung disease include smoking/tobacco exposure (former smoker). Treatments tried:  aspirin, dayquil, mucinex. The treatment provided mild relief. There is no history of asthma, COPD, environmental allergies or pneumonia.  Shortness of Breath  Associated symptoms include headaches and a sore throat. Pertinent negatives include no ear pain, fever or wheezing. There is no history of asthma, COPD or pneumonia.   No antibiotics in last month.  Blood pressure 130/80, pulse 70, temperature 98.2 F (36.8 C), temperature source Oral, height 5' 2.5" (1.588 m), weight 182 lb 4 oz (82.7 kg), last menstrual period 03/22/2014, SpO2 97 %.   Review of Systems  Constitutional: Negative for chills and fever.  HENT: Positive for sore throat. Negative for ear pain.   Respiratory: Positive for cough and shortness of breath. Negative for wheezing.   Allergic/Immunologic: Negative for environmental allergies.  Neurological: Positive for headaches.       Objective:   Physical Exam  Constitutional: Vital signs are normal. She appears well-developed and well-nourished. She is cooperative.  Non-toxic appearance. She does not appear ill. No distress.  HENT:  Head: Normocephalic.  Right Ear: Hearing, tympanic membrane, external ear and ear canal normal. Tympanic membrane is not erythematous, not retracted and not bulging.   Left Ear: Hearing, external ear and ear canal normal. Tympanic membrane is injected, erythematous and bulging. Tympanic membrane is not retracted. A middle ear effusion is present.  Nose: Mucosal edema and rhinorrhea present. Right sinus exhibits no maxillary sinus tenderness and no frontal sinus tenderness. Left sinus exhibits no maxillary sinus tenderness and no frontal sinus tenderness.  Mouth/Throat: Uvula is midline and mucous membranes are normal. Posterior oropharyngeal erythema present. No oropharyngeal exudate or posterior oropharyngeal edema.  Eyes: Pupils are equal, round, and reactive to light. Conjunctivae, EOM and lids are normal. Lids are everted and swept, no foreign bodies found.  Neck: Trachea normal and normal range of motion. Neck supple. Carotid bruit is not present. No thyroid mass and no thyromegaly present.  Cardiovascular: Normal rate, regular rhythm, S1 normal, S2 normal, normal heart sounds, intact distal pulses and normal pulses.  Exam reveals no gallop and no friction rub.   No murmur heard. Pulmonary/Chest: Effort normal and breath sounds normal. No tachypnea. No respiratory distress. She has no decreased breath sounds. She has no wheezes. She has no rhonchi. She has no rales.  Neurological: She is alert.  Skin: Skin is warm, dry and intact. No rash noted.  Psychiatric: Her speech is normal and behavior is normal. Judgment normal. Her mood appears not anxious. Cognition and memory are normal. She does not exhibit a depressed mood.          Assessment & Plan:

## 2017-10-01 NOTE — Patient Instructions (Addendum)
Start nasal saline spray  Can use mucinex DM twice daily.  Complete antibiotics for left ear infection.  Call if not improving as expected.  When able set up CPX with labs prior.

## 2017-10-26 ENCOUNTER — Other Ambulatory Visit: Payer: Self-pay | Admitting: Family Medicine

## 2017-10-28 NOTE — Telephone Encounter (Signed)
On 10.09.2018 #90+1 was faxed/thx dmf

## 2018-05-30 ENCOUNTER — Other Ambulatory Visit: Payer: Self-pay | Admitting: Family Medicine

## 2018-05-30 MED ORDER — LOSARTAN POTASSIUM-HCTZ 100-12.5 MG PO TABS: 1.0000 | ORAL_TABLET | Freq: Every day | ORAL | 0 refills | Status: DC

## 2018-05-30 NOTE — Telephone Encounter (Signed)
Copied from [place] [phone] [phone]. Topic: Quick Communication - Rx Refill/Question >> May 30, 2018  4:59 PM Wynetta Emery, [place] C wrote: Medication: losartan-hydrochlorothiazide (HYZAAR) 100-12.5 MG tablet   Has the patient contacted their pharmacy? No   (Agent: If no, request that the patient contact the pharmacy for the refill.) (Agent: If yes, when and what did the pharmacy advise?)  Preferred Pharmacy (with phone number or street name): [id number] - [place], [place] [address] [phone] (Phone) [phone] (Fax)      Agent: Please be advised that RX refills may take up to 3 business days. We ask that you follow-up with your pharmacy.

## 2018-07-25 ENCOUNTER — Ambulatory Visit: Payer: BLUE CROSS/BLUE SHIELD | Admitting: Internal Medicine

## 2018-08-08 ENCOUNTER — Ambulatory Visit: Payer: BLUE CROSS/BLUE SHIELD | Admitting: Internal Medicine

## 2018-09-12 ENCOUNTER — Encounter: Payer: Self-pay | Admitting: Internal Medicine

## 2018-09-12 ENCOUNTER — Ambulatory Visit: Payer: No Typology Code available for payment source | Admitting: Internal Medicine

## 2018-09-12 VITALS — BP 140/88 | HR 69 | Temp 98.0°F | Ht 62.5 in | Wt 190.0 lb

## 2018-09-12 DIAGNOSIS — I10 Essential (primary) hypertension: Secondary | ICD-10-CM

## 2018-09-12 DIAGNOSIS — R635 Abnormal weight gain: Secondary | ICD-10-CM

## 2018-09-12 DIAGNOSIS — R51 Headache: Secondary | ICD-10-CM

## 2018-09-12 DIAGNOSIS — R519 Headache, unspecified: Secondary | ICD-10-CM

## 2018-09-12 DIAGNOSIS — R140 Abdominal distension (gaseous): Secondary | ICD-10-CM | POA: Diagnosis not present

## 2018-09-12 DIAGNOSIS — G4700 Insomnia, unspecified: Secondary | ICD-10-CM

## 2018-09-12 DIAGNOSIS — R1012 Left upper quadrant pain: Secondary | ICD-10-CM | POA: Diagnosis not present

## 2018-09-12 DIAGNOSIS — F411 Generalized anxiety disorder: Secondary | ICD-10-CM

## 2018-09-12 MED ORDER — LOSARTAN POTASSIUM-HCTZ 100-12.5 MG PO TABS: 1.0000 | ORAL_TABLET | Freq: Every day | ORAL | 3 refills | Status: DC

## 2018-09-12 MED ORDER — SERTRALINE HCL 50 MG PO TABS: 50.0000 mg | ORAL_TABLET | Freq: Every day | ORAL | 3 refills | Status: DC

## 2018-09-12 NOTE — Progress Notes (Signed)
HPI  Pt presents to the clinic today to establish care and for management of the conditions listed below.  HTN: Her BP today is 140/88. She is taking Losartan HCT as prescribed. ECG from 08/2016 reviewed.  Insomnia: She has trouble staying asleep. She no longer takes Trazadone nightly for sleep because it made her too groogy.  Anxiety: Triggered by family stress. She feels like her Sertraline dose needs to be increased. She denies depression, SI/HI.  Frequent Headaches: These occur depending on her stress levels. She takes Excedrin Migraine with good relief.  She is concerned about weight gain. She really started to notice this with menopause. She reports she has tried low carb diet and exercise without results.   She reports LUQ pain. She describes this as tightness. It occurs intermittently, but she only notices it when she is sitting. She has had changes in her bowels over the last few months. She has intermittent constipation. She feels bloated. She denies nausea or vomiting, but does have some acid reflux (triggered by acidic foods). She has not taken anything OTC.  Flu: never Tetanus: < 10 years ago Pap Smear: hysterectomy Mammogram: scheduled in 2 weeks Colon Screening: never Vision Screening: as needed Dentist: annually  Past Medical History:  Diagnosis Date  . Fibroids   . Frequent headaches   . Hypertension   . Urine incontinence     Current Outpatient Medications  Medication Sig Dispense Refill  . amoxicillin (AMOXIL) 500 MG capsule Take 2 capsules (1,000 mg total) by mouth 2 (two) times daily. 40 capsule 0  . losartan-hydrochlorothiazide (HYZAAR) 100-12.5 MG tablet Take 1 tablet by mouth daily. 90 tablet 0  . sertraline (ZOLOFT) 25 MG tablet Take 1 tablet (25 mg total) by mouth daily. 90 tablet 1  . traZODone (DESYREL) 50 MG tablet Take 0.5-1 tablets (25-50 mg total) by mouth at bedtime as needed for sleep. 30 tablet 3   No current facility-administered medications  for this visit.     No Known Allergies  Family History  Problem Relation Age of Onset  . Diabetes Father   . Diabetes Paternal Aunt   . Diabetes Paternal Uncle   . Arthritis Maternal Grandmother   . Alcohol abuse Maternal Grandfather   . Diabetes Paternal Grandmother   . Diabetes Paternal Grandfather     Social History   Socioeconomic History  . Marital status: Divorced    Spouse name: Not on file  . Number of children: Not on file  . Years of education: Not on file  . Highest education level: Not on file  Occupational History  . Not on file  Social Needs  . Financial resource strain: Not on file  . Food insecurity:    Worry: Not on file    Inability: Not on file  . Transportation needs:    Medical: Not on file    Non-medical: Not on file  Tobacco Use  . Smoking status: Current Some Day Smoker    Packs/day: 0.30    Years: 4.00    Pack years: 1.20    Types: Cigarettes    Start date: 01/14/2009  . Smokeless tobacco: Never Used  Substance and Sexual Activity  . Alcohol use: No  . Drug use: No  . Sexual activity: Yes  Lifestyle  . Physical activity:    Days per week: Not on file    Minutes per session: Not on file  . Stress: Not on file  Relationships  . Social connections:    Talks  on phone: Not on file    Gets together: Not on file    Attends religious service: Not on file    Active member of club or organization: Not on file    Attends meetings of clubs or organizations: Not on file    Relationship status: Not on file  . Intimate partner violence:    Fear of current or ex partner: Not on file    Emotionally abused: Not on file    Physically abused: Not on file    Forced sexual activity: Not on file  Other Topics Concern  . Not on file  Social History Narrative  . Not on file    ROS:  Constitutional: Pt reports headaches and weight gain. Denies fever, malaise, fatigue.  HEENT: Denies eye pain, eye redness, ear pain, ringing in the ears, wax  buildup, runny nose, nasal congestion, bloody nose, or sore throat. Respiratory: Denies difficulty breathing, shortness of breath, cough or sputum production.   Cardiovascular: Denies chest pain, chest tightness, palpitations or swelling in the hands or feet.  Gastrointestinal: Pt reports LUQ abdominal pain, bloating. Denies constipation, diarrhea or blood in the stool.  GU: Denies frequency, urgency, pain with urination, blood in urine, odor or discharge. Musculoskeletal: Denies decrease in range of motion, difficulty with gait, muscle pain or joint pain and swelling.  Skin: Denies redness, rashes, lesions or ulcercations.  Neurological: Denies dizziness, difficulty with memory, difficulty with speech or problems with balance and coordination.  Psych: Pt reports anxiety. Denies depression, SI/HI.  No other specific complaints in a complete review of systems (except as listed in HPI above).  PE:  BP 140/88 (BP Location: Left Arm, Patient Position: Sitting, Cuff Size: Large)   Pulse 69   Temp 98 F (36.7 C) (Oral)   Ht 5' 2.5" (1.588 m)   Wt 190 lb (86.2 kg)   LMP 03/22/2014   SpO2 98%   BMI 34.20 kg/m   Wt Readings from Last 3 Encounters:  10/01/17 182 lb 4 oz (82.7 kg)  10/11/16 178 lb 8 oz (81 kg)  09/17/16 165 lb (74.8 kg)    General: Appears her stated age, obese in NAD. Neck: Neck supple, trachea midline. No masses, lumps or thyromegaly present.  Cardiovascular: Normal rate and rhythm. S1,S2 noted.  No murmur, rubs or gallops noted. No JVD or BLE edema.  Pulmonary/Chest: Normal effort and positive vesicular breath sounds. No respiratory distress. No wheezes, rales or ronchi noted.  Abdomen: Soft and nontender. Normal bowel sounds. No distention or masses noted. Neurological: Alert and oriented. Psychiatric: Tearful.Judgment and thought content normal.    BMET    Component Value Date/Time   NA 138 09/17/2016 1734   K 3.4 (L) 09/17/2016 1734   CL 101 09/17/2016 1734    CO2 24 09/17/2016 1734   GLUCOSE 106 (H) 09/17/2016 1734   BUN 9 09/17/2016 1734   CREATININE 0.63 09/17/2016 1734   CREATININE 0.61 02/12/2014 1439   CALCIUM 10.2 09/17/2016 1734   GFRNONAA >60 09/17/2016 1734   GFRAA >60 09/17/2016 1734    Lipid Panel     Component Value Date/Time   CHOL 204 (H) 06/22/2015 0746   TRIG 288.0 (H) 06/22/2015 0746   HDL 37.70 (L) 06/22/2015 0746   CHOLHDL 5 06/22/2015 0746   VLDL 57.6 (H) 06/22/2015 0746   LDLCALC 96 02/12/2014 1439    CBC    Component Value Date/Time   WBC 9.3 09/17/2016 1734   RBC 4.80 09/17/2016 1734  HGB 15.3 (H) 09/17/2016 1734   HCT 44.0 09/17/2016 1734   PLT 253 09/17/2016 1734   MCV 91.7 09/17/2016 1734   MCH 31.9 09/17/2016 1734   MCHC 34.8 09/17/2016 1734   RDW 12.3 09/17/2016 1734   LYMPHSABS 2.1 06/22/2015 0746   MONOABS 0.4 06/22/2015 0746   EOSABS 0.1 06/22/2015 0746   BASOSABS 0.0 06/22/2015 0746    Hgb A1C Lab Results  Component Value Date   HGBA1C 5.4 02/12/2014     Assessment and Plan:  LUQ Pain, Abdominal Bloating:  Will check CBC, CMET, Amylase, Lipase, TSH and H Pylori  Weight Gain:  Advised her to try the Fast Metabolism diet TSH today Consider Belviq if unable to lose weight on her on  Will follow up after lab tests. Return precautions discussed Webb Silversmith, NP

## 2018-09-13 DIAGNOSIS — R519 Headache, unspecified: Secondary | ICD-10-CM | POA: Insufficient documentation

## 2018-09-13 DIAGNOSIS — R51 Headache: Secondary | ICD-10-CM

## 2018-09-13 NOTE — Assessment & Plan Note (Signed)
Encouraged regular sleep routine Will monitor

## 2018-09-13 NOTE — Assessment & Plan Note (Signed)
Increase Sertraline to 50 mg daily Support offered today

## 2018-09-13 NOTE — Patient Instructions (Signed)

## 2018-09-13 NOTE — Assessment & Plan Note (Signed)
Controlled on Losartan HCT, refilled today Reinforced DASH diet

## 2018-09-13 NOTE — Assessment & Plan Note (Signed)
Controlled with Excedrin Migraine Will monitor

## 2018-09-15 LAB — H. PYLORI ANTIBODY, IGG: H. pylori, IgG AbS: 0.35 Index Value (ref 0.00–0.79)

## 2018-09-15 LAB — COMPREHENSIVE METABOLIC PANEL
ALK PHOS: 87 IU/L (ref 39–117)
ALT: 43 IU/L — ABNORMAL HIGH (ref 0–32)
AST: 24 IU/L (ref 0–40)
Albumin/Globulin Ratio: 2.2 (ref 1.2–2.2)
Albumin: 4.8 g/dL (ref 3.5–5.5)
BUN/Creatinine Ratio: 16 (ref 9–23)
BUN: 11 mg/dL (ref 6–24)
Bilirubin Total: 0.2 mg/dL (ref 0.0–1.2)
CO2: 29 mmol/L (ref 20–29)
CREATININE: 0.68 mg/dL (ref 0.57–1.00)
Calcium: 9.8 mg/dL (ref 8.7–10.2)
Chloride: 98 mmol/L (ref 96–106)
GFR calc Af Amer: 116 mL/min/{1.73_m2} (ref >59)
GFR calc non Af Amer: 101 mL/min/{1.73_m2} (ref >59)
GLOBULIN, TOTAL: 2.2 g/dL (ref 1.5–4.5)
Glucose: 109 mg/dL — ABNORMAL HIGH (ref 65–99)
Potassium: 4.1 mmol/L (ref 3.5–5.2)
SODIUM: 143 mmol/L (ref 134–144)
Total Protein: 7 g/dL (ref 6.0–8.5)

## 2018-09-15 LAB — LIPASE: LIPASE: 44 U/L (ref 14–72)

## 2018-09-15 LAB — TSH: TSH: 1.09 u[IU]/mL (ref 0.450–4.500)

## 2018-09-15 LAB — AMYLASE: Amylase: 33 U/L (ref 31–124)

## 2018-09-16 ENCOUNTER — Telehealth: Payer: Self-pay

## 2018-09-16 NOTE — Telephone Encounter (Signed)
Copied from [place] [phone]. Topic: General - Other >> Sep 16, 2018  1:18 PM Keene Breath wrote: Reason for CRM: Patient called to request to speak with the nurse regarding her BP medication Losartan.  There has been a recall and patient would like to switch to another medication. Please advise and call back at [phone] or [phone].

## 2018-09-16 NOTE — Telephone Encounter (Signed)
She needs to contact her pharmacist and see if her specific dose/lot number is part of the recall. We are having recalls on multiple medications so it doesn't make sense to switch unless her dose/lot number is affected.

## 2018-09-17 NOTE — Telephone Encounter (Signed)
Left detailed msg on VM per HIPAA  

## 2018-09-25 LAB — HM PAP SMEAR: HM Pap smear: NEGATIVE

## 2018-10-27 ENCOUNTER — Encounter: Payer: Self-pay | Admitting: *Deleted

## 2019-01-07 ENCOUNTER — Other Ambulatory Visit: Payer: Self-pay | Admitting: Internal Medicine

## 2019-01-08 ENCOUNTER — Other Ambulatory Visit: Payer: Self-pay | Admitting: Internal Medicine

## 2019-01-09 MED ORDER — HYDROCHLOROTHIAZIDE 12.5 MG PO CAPS: 12.5000 mg | ORAL_CAPSULE | Freq: Every day | ORAL | 1 refills | Status: DC

## 2019-10-14 ENCOUNTER — Telehealth: Payer: Self-pay

## 2019-10-14 NOTE — Telephone Encounter (Signed)
Pt left v/m that her pharmacy has already requested refills on BP meds and has not got response yet. Pt is not comfortable in coming to office due to covid. Pt request 90 day supply on BP meds. Pt request cb.

## 2019-10-15 MED ORDER — HYDROCHLOROTHIAZIDE 12.5 MG PO CAPS: 12.5000 mg | ORAL_CAPSULE | Freq: Every day | ORAL | 0 refills | Status: DC

## 2019-10-15 MED ORDER — LOSARTAN POTASSIUM 100 MG PO TABS: ORAL_TABLET | ORAL | 0 refills | Status: DC

## 2019-10-15 NOTE — Telephone Encounter (Signed)
Rx sent through [facility]  

## 2020-01-12 ENCOUNTER — Other Ambulatory Visit: Payer: Self-pay | Admitting: Internal Medicine

## 2020-01-13 ENCOUNTER — Other Ambulatory Visit: Payer: Self-pay | Admitting: Internal Medicine

## 2020-02-12 ENCOUNTER — Other Ambulatory Visit: Payer: Self-pay | Admitting: Internal Medicine

## 2020-02-12 NOTE — Telephone Encounter (Signed)
Pt is past due for CPE. Last refills given stated appt was needed. Please advise if refill can be given. Last OV acute 09-12-18

## 2020-03-15 ENCOUNTER — Telehealth: Payer: Self-pay | Admitting: Internal Medicine

## 2020-03-15 MED ORDER — LOSARTAN POTASSIUM 100 MG PO TABS: 100.0000 mg | ORAL_TABLET | Freq: Every day | ORAL | 0 refills | Status: DC

## 2020-03-15 MED ORDER — HYDROCHLOROTHIAZIDE 12.5 MG PO CAPS: 12.5000 mg | ORAL_CAPSULE | Freq: Every day | ORAL | 0 refills | Status: DC

## 2020-03-15 NOTE — Telephone Encounter (Signed)
Patient called and scheduled her cpx on 05/06/20.  Patient works at a [employer] and due to staffing she couldn't schedule an appointment until mid May. Patient needs a refill on her blood pressure medication.  Patient's out of medication.  Patient uses [facility].

## 2020-03-15 NOTE — Telephone Encounter (Signed)
Rx sent through [facility]  

## 2020-05-06 ENCOUNTER — Other Ambulatory Visit: Payer: Self-pay

## 2020-05-06 ENCOUNTER — Ambulatory Visit (INDEPENDENT_AMBULATORY_CARE_PROVIDER_SITE_OTHER): Payer: No Typology Code available for payment source | Admitting: Internal Medicine

## 2020-05-06 ENCOUNTER — Encounter: Payer: Self-pay | Admitting: Internal Medicine

## 2020-05-06 VITALS — BP 162/100 | HR 88 | Temp 97.8°F | Ht 62.5 in | Wt 188.0 lb

## 2020-05-06 DIAGNOSIS — Z0001 Encounter for general adult medical examination with abnormal findings: Secondary | ICD-10-CM | POA: Diagnosis not present

## 2020-05-06 DIAGNOSIS — R519 Headache, unspecified: Secondary | ICD-10-CM

## 2020-05-06 DIAGNOSIS — F411 Generalized anxiety disorder: Secondary | ICD-10-CM

## 2020-05-06 DIAGNOSIS — I10 Essential (primary) hypertension: Secondary | ICD-10-CM | POA: Diagnosis not present

## 2020-05-06 DIAGNOSIS — R635 Abnormal weight gain: Secondary | ICD-10-CM

## 2020-05-06 DIAGNOSIS — F5104 Psychophysiologic insomnia: Secondary | ICD-10-CM

## 2020-05-06 DIAGNOSIS — Z0000 Encounter for general adult medical examination without abnormal findings: Secondary | ICD-10-CM

## 2020-05-06 MED ORDER — HYDROCHLOROTHIAZIDE 12.5 MG PO CAPS: 12.5000 mg | ORAL_CAPSULE | Freq: Every day | ORAL | 0 refills | Status: DC

## 2020-05-06 MED ORDER — LOSARTAN POTASSIUM 100 MG PO TABS: 100.0000 mg | ORAL_TABLET | Freq: Every day | ORAL | 0 refills | Status: DC

## 2020-05-06 MED ORDER — BUPROPION HCL ER (XL) 150 MG PO TB24: 150.0000 mg | ORAL_TABLET | Freq: Every day | ORAL | 0 refills | Status: DC

## 2020-05-06 NOTE — Progress Notes (Signed)
Subjective:    Patient ID: Wanda Aguilar, female    DOB: [birth year], 54 y.o.   MRN: [id number]  HPI  Pt presents to the clinic today for her annual exam. She is also due to follow up chronic conditions.  Anxiety: Deteriorated due to general life stress. She stopped taking her Sertraline but thinks she may need to get started on something else. She is not currently seeing a therapist. She denies depression, SI/HI.  Frequent Headaches: These occur about 2 x week. She takes Duexis, as needed with good relief of symptoms. She would like a refill of this today. She does not follow with neurology.   HTN: Her BP today is 166/100. She is taking Losartan and HCT as prescribed. ECG from 08/2016 reviewed.  Insomnia: She has trouble falling asleep at times. She is not taking anything for this at this time. There is no sleep study on file.   Flu: never Tetanus: about 9-10 years Covid: never Pap Smear: 09/2018 Mammogram: 03/2018, [facility] Colon Screening: 12/2018, [facility] Screening: as needed Dentist: annually  Diet: She does eat meat. She consumes fruits and veggies daily. She tries to avoid fried foods. She drinks mostly water, 3 Dt. Mt Dew. Exercise: None  Review of Systems  Past Medical History:  Diagnosis Date  . Fibroids   . Frequent headaches   . Hypertension   . Urine incontinence     Current Outpatient Medications  Medication Sig Dispense Refill  . hydrochlorothiazide (MICROZIDE) 12.5 MG capsule Take 1 capsule (12.5 mg total) by mouth daily. 90 capsule 0  . losartan (COZAAR) 100 MG tablet Take 1 tablet (100 mg total) by mouth daily. 90 tablet 0  . sertraline (ZOLOFT) 50 MG tablet Take 1 tablet (50 mg total) by mouth daily. 90 tablet 3   No current facility-administered medications for this visit.    No Known Allergies  Family History  Problem Relation Age of Onset  . Diabetes Father   . Diabetes Paternal Aunt   . Diabetes Paternal Uncle   .  Arthritis Maternal Grandmother   . Alcohol abuse Maternal Grandfather   . Diabetes Paternal Grandmother   . Diabetes Paternal Grandfather     Social History   Socioeconomic History  . Marital status: Divorced    Spouse name: Not on file  . Number of children: Not on file  . Years of education: Not on file  . Highest education level: Not on file  Occupational History  . Not on file  Tobacco Use  . Smoking status: Current Some Day Smoker    Packs/day: 0.30    Years: 4.00    Pack years: 1.20    Types: Cigarettes    Start date: 01/14/2009  . Smokeless tobacco: Never Used  Substance and Sexual Activity  . Alcohol use: No  . Drug use: No  . Sexual activity: Yes  Other Topics Concern  . Not on file  Social History Narrative  . Not on file   Social Determinants of Health   Financial Resource Strain:   . Difficulty of Paying Living Expenses:   Food Insecurity:   . Worried About Charity fundraiser in the Last Year:   . Arboriculturist in the Last Year:   Transportation Needs:   . Film/video editor (Medical):   [name] Lack of Transportation (Non-Medical):   Physical Activity:   . Days of Exercise per Week:   . Minutes of Exercise per Session:  Stress:   . Feeling of Stress :   Social Connections:   . Frequency of Communication with Friends and Family:   . Frequency of Social Gatherings with Friends and Family:   . Attends Religious Services:   . Active Member of Clubs or Organizations:   . Attends Archivist Meetings:   [name] Marital Status:   Intimate Partner Violence:   . Fear of Current or Ex-Partner:   . Emotionally Abused:   [name] Physically Abused:   . Sexually Abused:      Constitutional: Pt reports intermittent headaches, abnormal weight gain. Denies fever, malaise, fatigue.  HEENT: Denies eye pain, eye redness, ear pain, ringing in the ears, wax buildup, runny nose, nasal congestion, bloody nose, or sore throat. Respiratory: Denies difficulty  breathing, shortness of breath, cough or sputum production.   Cardiovascular: Denies chest pain, chest tightness, palpitations or swelling in the hands or feet.  Gastrointestinal: Denies abdominal pain, bloating, constipation, diarrhea or blood in the stool.  GU: Denies urgency, frequency, pain with urination, burning sensation, blood in urine, odor or discharge. Musculoskeletal: Pt reports intermittent pain in feet. Denies decrease in range of motion, difficulty with gait, muscle pain or joint swelling.  Skin: Denies redness, rashes, lesions or ulcercations.  Neurological: Pt reports insomnia. Denies dizziness, difficulty with memory, difficulty with speech or problems with balance and coordination.  Psych: Pt has a history of anxiety. Denies depression, SI/HI.  No other specific complaints in a complete review of systems (except as listed in HPI above).     Objective:   Physical Exam   BP (!) 162/100   Pulse 88   Temp 97.8 F (36.6 C) (Temporal)   Ht 5' 2.5" (1.588 m)   Wt 188 lb (85.3 kg)   LMP 03/22/2014   SpO2 98%   BMI 33.84 kg/m    Wt Readings from Last 3 Encounters:  09/12/18 190 lb (86.2 kg)  10/01/17 182 lb 4 oz (82.7 kg)  10/11/16 178 lb 8 oz (81 kg)    General: Appears her stated age, obese, in NAD. Skin: Warm, dry and intact. No rashes noted. HEENT: Head: normal shape and size; Eyes: sclera white, no icterus, conjunctiva pink, PERRLA and EOMs intact;  Neck:  Neck supple, trachea midline. No masses, lumps or thyromegaly present.  Cardiovascular: Normal rate and rhythm. S1,S2 noted.  No murmur, rubs or gallops noted. No JVD or BLE edema. No carotid bruits noted. Pulmonary/Chest: Normal effort and positive vesicular breath sounds. No respiratory distress. No wheezes, rales or ronchi noted.  Abdomen: Soft and nontender. Normal bowel sounds. No distention or masses noted. Liver, spleen and kidneys non palpable. Musculoskeletal: Strength 5/5 BUE/BLE. No difficulty with  gait.  Neurological: Alert and oriented. Cranial nerves II-XII grossly intact. Coordination normal.  Psychiatric: Tearful. Behavior is normal. Judgment and thought content normal.     BMET    Component Value Date/Time   NA 143 09/12/2018 1609   K 4.1 09/12/2018 1609   CL 98 09/12/2018 1609   CO2 29 09/12/2018 1609   GLUCOSE 109 (H) 09/12/2018 1609   GLUCOSE 106 (H) 09/17/2016 1734   BUN 11 09/12/2018 1609   CREATININE 0.68 09/12/2018 1609   CREATININE 0.61 02/12/2014 1439   CALCIUM 9.8 09/12/2018 1609   GFRNONAA 101 09/12/2018 1609   GFRAA 116 09/12/2018 1609    Lipid Panel     Component Value Date/Time   CHOL 204 (H) 06/22/2015 0746   TRIG 288.0 (H) 06/22/2015 [id number]  HDL 37.70 (L) 06/22/2015 0746   CHOLHDL 5 06/22/2015 0746   VLDL 57.6 (H) 06/22/2015 0746   LDLCALC 96 02/12/2014 1439    CBC    Component Value Date/Time   WBC 9.3 09/17/2016 1734   RBC 4.80 09/17/2016 1734   HGB 15.3 (H) 09/17/2016 1734   HCT 44.0 09/17/2016 1734   PLT 253 09/17/2016 1734   MCV 91.7 09/17/2016 1734   MCH 31.9 09/17/2016 1734   MCHC 34.8 09/17/2016 1734   RDW 12.3 09/17/2016 1734   LYMPHSABS 2.1 06/22/2015 0746   MONOABS 0.4 06/22/2015 0746   EOSABS 0.1 06/22/2015 0746   BASOSABS 0.0 06/22/2015 0746    Hgb A1C Lab Results  Component Value Date   HGBA1C 5.4 02/12/2014           Assessment & Plan:   Preventative Health Maintenance:  Encouraged her to get a flu shot in the fall Will consider tetanus next year Pap smear UTD, will request copy Mammogram UTD, will request copy Colon screening UTD, will request copy Encouraged her to consume a balanced diet and exercise regimen Advised him to see an eye doctor and dentist annually Will check CBC, TSH, CMET, Lipid, and Vit D today  Abnormal Weight Gain:  Encouraged keto diet, 1200 calorie max Will check TSH and A1C Could consider Saxenda if A1C elevated  RTC in 1 year, sooner if needed Webb Silversmith, NP This  visit occurred during the SARS-CoV-2 public health emergency.  Safety protocols were in place, including screening questions prior to the visit, additional usage of staff PPE, and extensive cleaning of exam room while observing appropriate contact time as indicated for disinfecting solutions.

## 2020-05-07 LAB — COMPREHENSIVE METABOLIC PANEL
AG Ratio: 2.3 (calc) (ref 1.0–2.5)
ALT: 32 U/L — ABNORMAL HIGH (ref 6–29)
AST: 21 U/L (ref 10–35)
Albumin: 4.9 g/dL (ref 3.6–5.1)
Alkaline phosphatase (APISO): 80 U/L (ref 37–153)
BUN: 10 mg/dL (ref 7–25)
CO2: 27 mmol/L (ref 20–32)
Calcium: 10.1 mg/dL (ref 8.6–10.4)
Chloride: 99 mmol/L (ref 98–110)
Creat: 0.73 mg/dL (ref 0.50–1.05)
Globulin: 2.1 g/dL (calc) (ref 1.9–3.7)
Glucose, Bld: 107 mg/dL — ABNORMAL HIGH (ref 65–99)
Potassium: 3.8 mmol/L (ref 3.5–5.3)
Sodium: 139 mmol/L (ref 135–146)
Total Bilirubin: 0.6 mg/dL (ref 0.2–1.2)
Total Protein: 7 g/dL (ref 6.1–8.1)

## 2020-05-07 LAB — LIPID PANEL
Cholesterol: 199 mg/dL (ref <200)
HDL: 46 mg/dL — ABNORMAL LOW (ref >=50)
LDL Cholesterol (Calc): 126 mg/dL (calc) — ABNORMAL HIGH
Non-HDL Cholesterol (Calc): 153 mg/dL (calc) — ABNORMAL HIGH (ref <130)
Total CHOL/HDL Ratio: 4.3 (calc) (ref <5.0)
Triglycerides: 156 mg/dL — ABNORMAL HIGH (ref <150)

## 2020-05-07 LAB — CBC
HCT: 41.3 % (ref 35.0–45.0)
Hemoglobin: 14.7 g/dL (ref 11.7–15.5)
MCH: 32.2 pg (ref 27.0–33.0)
MCHC: 35.6 g/dL (ref 32.0–36.0)
MCV: 90.4 fL (ref 80.0–100.0)
MPV: 10.5 fL (ref 7.5–12.5)
Platelets: 250 10*3/uL (ref 140–400)
RBC: 4.57 10*6/uL (ref 3.80–5.10)
RDW: 11.9 % (ref 11.0–15.0)
WBC: 7 10*3/uL (ref 3.8–10.8)

## 2020-05-07 LAB — VITAMIN D 25 HYDROXY (VIT D DEFICIENCY, FRACTURES): Vit D, 25-Hydroxy: 11 ng/mL — ABNORMAL LOW (ref 30–100)

## 2020-05-07 LAB — HEMOGLOBIN A1C
Hgb A1c MFr Bld: 6 % of total Hgb — ABNORMAL HIGH (ref <5.7)
Mean Plasma Glucose: 126 (calc)
eAG (mmol/L): 7 (calc)

## 2020-05-07 LAB — TSH: TSH: 0.72 mIU/L

## 2020-05-07 MED ORDER — AMLODIPINE BESYLATE 5 MG PO TABS
5.0000 mg | ORAL_TABLET | Freq: Every day | ORAL | 0 refills | Status: DC
Start: 2020-05-07 — End: 2020-06-28

## 2020-05-07 MED ORDER — IBUPROFEN-FAMOTIDINE 800-26.6 MG PO TABS: 1.0000 | ORAL_TABLET | Freq: Every day | ORAL | 2 refills | Status: DC | PRN

## 2020-05-07 NOTE — Assessment & Plan Note (Signed)
Will trial Wellbutrin, RX sent to pharmacy Update me in 1 month and let me know how you are doing

## 2020-05-07 NOTE — Assessment & Plan Note (Signed)
Avoid triggers Continue Duexis prn, refilled today CMET today

## 2020-05-07 NOTE — Assessment & Plan Note (Signed)
Will see if this improves with Wellbutrin Can take Melatonin OTC

## 2020-05-07 NOTE — Assessment & Plan Note (Signed)
Uncontrolled Continue Losartan, HCTZ Will add Amlodipine 5 mg daily Reinforced DASH diet and exercise for weight loss  RTC in 2 weeks for follow up HTN

## 2020-05-07 NOTE — Patient Instructions (Signed)
Health Maintenance, Female Adopting a healthy lifestyle and getting preventive care are important in promoting health and wellness. Ask your health care provider about:  The right schedule for you to have regular tests and exams.  Things you can do on your own to prevent diseases and keep yourself healthy. What should I know about diet, weight, and exercise? Eat a healthy diet   Eat a diet that includes plenty of vegetables, fruits, low-fat dairy products, and lean protein.  Do not eat a lot of foods that are high in solid fats, added sugars, or sodium. Maintain a healthy weight Body mass index (BMI) is used to identify weight problems. It estimates body fat based on height and weight. Your health care provider can help determine your BMI and help you achieve or maintain a healthy weight. Get regular exercise Get regular exercise. This is one of the most important things you can do for your health. Most adults should:  Exercise for at least 150 minutes each week. The exercise should increase your heart rate and make you sweat (moderate-intensity exercise).  Do strengthening exercises at least twice a week. This is in addition to the moderate-intensity exercise.  Spend less time sitting. Even light physical activity can be beneficial. Watch cholesterol and blood lipids Have your blood tested for lipids and cholesterol at 54 years of age, then have this test every 5 years. Have your cholesterol levels checked more often if:  Your lipid or cholesterol levels are high.  You are older than 54 years of age.  You are at high risk for heart disease. What should I know about cancer screening? Depending on your health history and family history, you may need to have cancer screening at various ages. This may include screening for:  Breast cancer.  Cervical cancer.  Colorectal cancer.  Skin cancer.  Lung cancer. What should I know about heart disease, diabetes, and high blood  pressure? Blood pressure and heart disease  High blood pressure causes heart disease and increases the risk of stroke. This is more likely to develop in people who have high blood pressure readings, are of African descent, or are overweight.  Have your blood pressure checked: ? Every 3-5 years if you are 18-39 years of age. ? Every year if you are 40 years old or older. Diabetes Have regular diabetes screenings. This checks your fasting blood sugar level. Have the screening done:  Once every three years after age 40 if you are at a normal weight and have a low risk for diabetes.  More often and at a younger age if you are overweight or have a high risk for diabetes. What should I know about preventing infection? Hepatitis B If you have a higher risk for hepatitis B, you should be screened for this virus. Talk with your health care provider to find out if you are at risk for hepatitis B infection. Hepatitis C Testing is recommended for:  Everyone born from 1945 through 1965.  Anyone with known risk factors for hepatitis C. Sexually transmitted infections (STIs)  Get screened for STIs, including gonorrhea and chlamydia, if: ? You are sexually active and are younger than 54 years of age. ? You are older than 54 years of age and your health care provider tells you that you are at risk for this type of infection. ? Your sexual activity has changed since you were last screened, and you are at increased risk for chlamydia or gonorrhea. Ask your health care provider if   you are at risk.  Ask your health care provider about whether you are at high risk for HIV. Your health care provider may recommend a prescription medicine to help prevent HIV infection. If you choose to take medicine to prevent HIV, you should first get tested for HIV. You should then be tested every 3 months for as long as you are taking the medicine. Pregnancy  If you are about to stop having your period (premenopausal) and  you may become pregnant, seek counseling before you get pregnant.  Take 400 to 800 micrograms (mcg) of folic acid every day if you become pregnant.  Ask for birth control (contraception) if you want to prevent pregnancy. Osteoporosis and menopause Osteoporosis is a disease in which the bones lose minerals and strength with aging. This can result in bone fractures. If you are 65 years old or older, or if you are at risk for osteoporosis and fractures, ask your health care provider if you should:  Be screened for bone loss.  Take a calcium or vitamin D supplement to lower your risk of fractures.  Be given hormone replacement therapy (HRT) to treat symptoms of menopause. Follow these instructions at home: Lifestyle  Do not use any products that contain nicotine or tobacco, such as cigarettes, e-cigarettes, and chewing tobacco. If you need help quitting, ask your health care provider.  Do not use street drugs.  Do not share needles.  Ask your health care provider for help if you need support or information about quitting drugs. Alcohol use  Do not drink alcohol if: ? Your health care provider tells you not to drink. ? You are pregnant, may be pregnant, or are planning to become pregnant.  If you drink alcohol: ? Limit how much you use to 0-1 drink a day. ? Limit intake if you are breastfeeding.  Be aware of how much alcohol is in your drink. In the [place] one drink equals one 12 oz bottle of beer (355 mL), one 5 oz glass of wine (148 mL), or one 1 oz glass of hard liquor (44 mL). General instructions  Schedule regular health, dental, and eye exams.  Stay current with your vaccines.  Tell your health care provider if: ? You often feel depressed. ? You have ever been abused or do not feel safe at home. Summary  Adopting a healthy lifestyle and getting preventive care are important in promoting health and wellness.  Follow your health care provider's instructions about healthy  diet, exercising, and getting tested or screened for diseases.  Follow your health care provider's instructions on monitoring your cholesterol and blood pressure. This information is not intended to replace advice given to you by your health care provider. Make sure you discuss any questions you have with your health care provider. Document Revised: 12/03/2018 Document Reviewed: 12/03/2018 Elsevier Patient Education  2020 [employer]  

## 2020-05-13 ENCOUNTER — Telehealth: Payer: Self-pay

## 2020-05-13 NOTE — Telephone Encounter (Signed)
[place] Night - Client Nonclinical Telephone Record [facility] Client [place] Night - Client Client Site [place] - Night Contact Type Call Who Is Calling Patient / Member / Family / Caregiver Caller Name Bassett Phone Number [phone] Call Type Message Only Information Provided Reason for Call Returning a Call from the Office Initial [place] is returning a call from the office. Additional Comment Disp. Time Disposition Final User 05/12/2020 5:05:37 PM General Information Provided Yes Renato Shin Call Closed By: Renato Shin Transaction Date/Time: 05/12/2020 5:04:30 PM (ET)

## 2020-05-13 NOTE — Telephone Encounter (Signed)
See 05/06/20 lab results.

## 2020-05-19 ENCOUNTER — Encounter: Payer: Self-pay | Admitting: Internal Medicine

## 2020-05-30 ENCOUNTER — Other Ambulatory Visit: Payer: Self-pay | Admitting: Internal Medicine

## 2020-06-03 ENCOUNTER — Ambulatory Visit: Payer: No Typology Code available for payment source | Admitting: Internal Medicine

## 2020-06-11 ENCOUNTER — Other Ambulatory Visit: Payer: Self-pay | Admitting: Internal Medicine

## 2020-07-02 ENCOUNTER — Other Ambulatory Visit: Payer: Self-pay | Admitting: Internal Medicine

## 2020-08-09 ENCOUNTER — Telehealth: Payer: Self-pay | Admitting: Internal Medicine

## 2020-08-09 MED ORDER — IBUPROFEN-FAMOTIDINE 800-26.6 MG PO TABS: 1.0000 | ORAL_TABLET | Freq: Every day | ORAL | 2 refills | Status: DC | PRN

## 2020-08-09 NOTE — Telephone Encounter (Signed)
Work [phone]  Pt called needing rx for deuxis  sent to [facility] in [place] # [phone]

## 2020-08-09 NOTE — Telephone Encounter (Signed)
Rx sent through [facility]  

## 2020-08-20 ENCOUNTER — Other Ambulatory Visit: Payer: Self-pay | Admitting: Internal Medicine

## 2020-09-18 ENCOUNTER — Other Ambulatory Visit: Payer: Self-pay | Admitting: Internal Medicine

## 2020-09-18 DIAGNOSIS — I10 Essential (primary) hypertension: Secondary | ICD-10-CM

## 2020-09-18 DIAGNOSIS — Z0000 Encounter for general adult medical examination without abnormal findings: Secondary | ICD-10-CM

## 2020-10-25 NOTE — Telephone Encounter (Signed)
Pt notified per lab result note.

## 2020-11-14 ENCOUNTER — Other Ambulatory Visit: Payer: Self-pay | Admitting: Internal Medicine

## 2020-12-12 ENCOUNTER — Other Ambulatory Visit: Payer: Self-pay | Admitting: Internal Medicine

## 2020-12-27 ENCOUNTER — Other Ambulatory Visit: Payer: Self-pay | Admitting: Internal Medicine

## 2020-12-27 DIAGNOSIS — Z0000 Encounter for general adult medical examination without abnormal findings: Secondary | ICD-10-CM

## 2020-12-27 DIAGNOSIS — I10 Essential (primary) hypertension: Secondary | ICD-10-CM

## 2021-01-21 ENCOUNTER — Encounter: Payer: Self-pay | Admitting: Family Medicine

## 2021-01-21 ENCOUNTER — Other Ambulatory Visit: Payer: Self-pay

## 2021-01-21 ENCOUNTER — Inpatient Hospital Stay
Admission: EM | Admit: 2021-01-21 | Discharge: 2021-01-22 | DRG: 308 | Disposition: A | Discharge status: Home / Self Care | Payer: No Typology Code available for payment source | Attending: Internal Medicine | Admitting: Internal Medicine

## 2021-01-21 ENCOUNTER — Emergency Department: Payer: No Typology Code available for payment source

## 2021-01-21 DIAGNOSIS — Z79899 Other long term (current) drug therapy: Secondary | ICD-10-CM

## 2021-01-21 DIAGNOSIS — F32A Depression, unspecified: Secondary | ICD-10-CM | POA: Diagnosis present

## 2021-01-21 DIAGNOSIS — E1159 Type 2 diabetes mellitus with other circulatory complications: Secondary | ICD-10-CM

## 2021-01-21 DIAGNOSIS — E876 Hypokalemia: Secondary | ICD-10-CM | POA: Diagnosis present

## 2021-01-21 DIAGNOSIS — I4891 Unspecified atrial fibrillation: Secondary | ICD-10-CM | POA: Diagnosis present

## 2021-01-21 DIAGNOSIS — U071 COVID-19: Secondary | ICD-10-CM | POA: Diagnosis present

## 2021-01-21 DIAGNOSIS — I480 Paroxysmal atrial fibrillation: Principal | ICD-10-CM | POA: Diagnosis present

## 2021-01-21 DIAGNOSIS — F17210 Nicotine dependence, cigarettes, uncomplicated: Secondary | ICD-10-CM | POA: Diagnosis present

## 2021-01-21 DIAGNOSIS — I248 Other forms of acute ischemic heart disease: Secondary | ICD-10-CM

## 2021-01-21 DIAGNOSIS — I10 Essential (primary) hypertension: Secondary | ICD-10-CM

## 2021-01-21 LAB — BASIC METABOLIC PANEL
Anion gap: 12 (ref 5–15)
Anion gap: 8 (ref 5–15)
BUN: 14 mg/dL (ref 6–20)
BUN: 13 mg/dL (ref 6–20)
CO2: 24 mmol/L (ref 22–32)
CO2: 24 mmol/L (ref 22–32)
Calcium: 9.4 mg/dL (ref 8.9–10.3)
Calcium: 8.7 mg/dL — ABNORMAL LOW (ref 8.9–10.3)
Chloride: 107 mmol/L (ref 98–111)
Chloride: 110 mmol/L (ref 98–111)
Creatinine, Ser: 0.63 mg/dL (ref 0.44–1.00)
Creatinine, Ser: 0.59 mg/dL (ref 0.44–1.00)
GFR, Estimated: >60 mL/min (ref >60)
GFR, Estimated: >60 mL/min (ref >60)
Glucose, Bld: 176 mg/dL — ABNORMAL HIGH (ref 70–99)
Glucose, Bld: 194 mg/dL — ABNORMAL HIGH (ref 70–99)
Potassium: 3.4 mmol/L — ABNORMAL LOW (ref 3.5–5.1)
Potassium: 4 mmol/L (ref 3.5–5.1)
Sodium: 143 mmol/L (ref 135–145)
Sodium: 142 mmol/L (ref 135–145)

## 2021-01-21 LAB — CBC WITH DIFFERENTIAL/PLATELET
Abs Immature Granulocytes: 0.03 10*3/uL (ref 0.00–0.07)
Basophils Absolute: 0.1 10*3/uL (ref 0.0–0.1)
Basophils Relative: 1 %
Eosinophils Absolute: 0.1 10*3/uL (ref 0.0–0.5)
Eosinophils Relative: 2 %
HCT: 39.5 % (ref 36.0–46.0)
Hemoglobin: 13.4 g/dL (ref 12.0–15.0)
Immature Granulocytes: 0 %
Lymphocytes Relative: 41 %
Lymphs Abs: 2.8 10*3/uL (ref 0.7–4.0)
MCH: 31.2 pg (ref 26.0–34.0)
MCHC: 33.9 g/dL (ref 30.0–36.0)
MCV: 91.9 fL (ref 80.0–100.0)
Monocytes Absolute: 0.6 10*3/uL (ref 0.1–1.0)
Monocytes Relative: 9 %
Neutro Abs: 3.3 10*3/uL (ref 1.7–7.7)
Neutrophils Relative %: 47 %
Platelets: 234 10*3/uL (ref 150–400)
RBC: 4.3 MIL/uL (ref 3.87–5.11)
RDW: 12.5 % (ref 11.5–15.5)
WBC: 6.8 10*3/uL (ref 4.0–10.5)
nRBC: 0 % (ref 0.0–0.2)

## 2021-01-21 LAB — CBC
HCT: 35.5 % — ABNORMAL LOW (ref 36.0–46.0)
Hemoglobin: 12.1 g/dL (ref 12.0–15.0)
MCH: 31.8 pg (ref 26.0–34.0)
MCHC: 34.1 g/dL (ref 30.0–36.0)
MCV: 93.4 fL (ref 80.0–100.0)
Platelets: 232 10*3/uL (ref 150–400)
RBC: 3.8 MIL/uL — ABNORMAL LOW (ref 3.87–5.11)
RDW: 12.5 % (ref 11.5–15.5)
WBC: 7.2 10*3/uL (ref 4.0–10.5)
nRBC: 0 % (ref 0.0–0.2)

## 2021-01-21 LAB — FERRITIN: Ferritin: 184 ng/mL (ref 11–307)

## 2021-01-21 LAB — HIV ANTIBODY (ROUTINE TESTING W REFLEX): HIV Screen 4th Generation wRfx: NONREACTIVE

## 2021-01-21 LAB — C-REACTIVE PROTEIN: CRP: 1.9 mg/dL — ABNORMAL HIGH (ref <1.0)

## 2021-01-21 LAB — TROPONIN I (HIGH SENSITIVITY)
Troponin I (High Sensitivity): 18 ng/L — ABNORMAL HIGH (ref <18)
Troponin I (High Sensitivity): 38 ng/L — ABNORMAL HIGH (ref <18)

## 2021-01-21 LAB — MAGNESIUM: Magnesium: 2.3 mg/dL (ref 1.7–2.4)

## 2021-01-21 LAB — SARS CORONAVIRUS 2 BY RT PCR (HOSPITAL ORDER, PERFORMED IN ~~LOC~~ HOSPITAL LAB): SARS Coronavirus 2: POSITIVE — AB

## 2021-01-21 MED ORDER — METOPROLOL SUCCINATE ER 50 MG PO TB24: 50.0000 mg | ORAL_TABLET | Freq: Every day | ORAL | Status: DC

## 2021-01-21 MED ORDER — ALBUTEROL SULFATE HFA 108 (90 BASE) MCG/ACT IN AERS
2.0000 | INHALATION_SPRAY | RESPIRATORY_TRACT | Status: DC | PRN
  Filled 2021-01-21: qty 6.7

## 2021-01-21 MED ORDER — SODIUM CHLORIDE 0.9 % IV SOLN: INTRAVENOUS | Status: DC

## 2021-01-21 MED ORDER — ONDANSETRON HCL 4 MG/2ML IJ SOLN: 4.0000 mg | Freq: Four times a day (QID) | INTRAMUSCULAR | Status: DC | PRN

## 2021-01-21 MED ORDER — DILTIAZEM HCL ER COATED BEADS 120 MG PO CP24
240.0000 mg | ORAL_CAPSULE | Freq: Every day | ORAL | Status: DC
  Administered 2021-01-22: 240 mg via ORAL
  Filled 2021-01-21: qty 2

## 2021-01-21 MED ORDER — ALPRAZOLAM 0.25 MG PO TABS: 0.2500 mg | ORAL_TABLET | Freq: Two times a day (BID) | ORAL | Status: DC | PRN

## 2021-01-21 MED ORDER — DILTIAZEM HCL-DEXTROSE 125-5 MG/125ML-% IV SOLN (PREMIX)
5.0000 mg/h | INTRAVENOUS | Status: DC
  Administered 2021-01-21: 5 mg/h via INTRAVENOUS
  Filled 2021-01-21: qty 125

## 2021-01-21 MED ORDER — DILTIAZEM HCL ER COATED BEADS 180 MG PO CP24
180.0000 mg | ORAL_CAPSULE | Freq: Once | ORAL | Status: AC
  Administered 2021-01-21: 180 mg via ORAL
  Filled 2021-01-21: qty 1

## 2021-01-21 MED ORDER — ACETAMINOPHEN 325 MG PO TABS
650.0000 mg | ORAL_TABLET | ORAL | Status: DC | PRN
  Administered 2021-01-21: 650 mg via ORAL
  Filled 2021-01-21: qty 2

## 2021-01-21 MED ORDER — LACTATED RINGERS IV BOLUS
1000.0000 mL | Freq: Once | INTRAVENOUS | Status: AC
  Administered 2021-01-21: 1000 mL via INTRAVENOUS

## 2021-01-21 MED ORDER — MAGNESIUM SULFATE 2 GM/50ML IV SOLN
2.0000 g | Freq: Once | INTRAVENOUS | Status: AC
  Administered 2021-01-21: 2 g via INTRAVENOUS
  Filled 2021-01-21: qty 50

## 2021-01-21 MED ORDER — APIXABAN 5 MG PO TABS
5.0000 mg | ORAL_TABLET | Freq: Two times a day (BID) | ORAL | Status: DC
  Administered 2021-01-21 – 2021-01-22 (×3): 5 mg via ORAL
  Filled 2021-01-21 (×3): qty 1

## 2021-01-21 MED ORDER — SODIUM CHLORIDE 0.9 % IV SOLN
100.0000 mg | Freq: Every day | INTRAVENOUS | Status: DC
  Administered 2021-01-22: 100 mg via INTRAVENOUS
  Filled 2021-01-21: qty 20

## 2021-01-21 MED ORDER — METOPROLOL TARTRATE 25 MG PO TABS
25.0000 mg | ORAL_TABLET | Freq: Three times a day (TID) | ORAL | Status: DC
  Administered 2021-01-21 – 2021-01-22 (×2): 25 mg via ORAL
  Filled 2021-01-21 (×3): qty 1

## 2021-01-21 MED ORDER — ZOLPIDEM TARTRATE 5 MG PO TABS
5.0000 mg | ORAL_TABLET | Freq: Every evening | ORAL | Status: DC | PRN
  Administered 2021-01-21: 5 mg via ORAL
  Filled 2021-01-21: qty 1

## 2021-01-21 MED ORDER — POTASSIUM CHLORIDE CRYS ER 20 MEQ PO TBCR
40.0000 meq | EXTENDED_RELEASE_TABLET | Freq: Once | ORAL | Status: AC
  Administered 2021-01-21: 40 meq via ORAL
  Filled 2021-01-21: qty 2

## 2021-01-21 MED ORDER — DILTIAZEM HCL-DEXTROSE 125-5 MG/125ML-% IV SOLN (PREMIX)
5.0000 mg/h | INTRAVENOUS | Status: DC
  Administered 2021-01-21: 10 mg/h via INTRAVENOUS
  Administered 2021-01-21: 15 mg/h via INTRAVENOUS
  Filled 2021-01-21: qty 125

## 2021-01-21 MED ORDER — DILTIAZEM HCL 25 MG/5ML IV SOLN
15.0000 mg | Freq: Once | INTRAVENOUS | Status: AC
  Administered 2021-01-21: 15 mg via INTRAVENOUS
  Filled 2021-01-21: qty 5

## 2021-01-21 MED ORDER — BUPROPION HCL ER (XL) 150 MG PO TB24
150.0000 mg | ORAL_TABLET | Freq: Every day | ORAL | Status: DC
  Filled 2021-01-21: qty 1

## 2021-01-21 MED ORDER — LOSARTAN POTASSIUM 50 MG PO TABS
50.0000 mg | ORAL_TABLET | Freq: Every day | ORAL | Status: DC
  Administered 2021-01-22: 50 mg via ORAL
  Filled 2021-01-21: qty 1

## 2021-01-21 MED ORDER — HYDROCHLOROTHIAZIDE 12.5 MG PO CAPS
12.5000 mg | ORAL_CAPSULE | Freq: Every day | ORAL | Status: DC
  Administered 2021-01-21: 12.5 mg via ORAL
  Filled 2021-01-21: qty 1

## 2021-01-21 MED ORDER — LOSARTAN POTASSIUM 50 MG PO TABS
100.0000 mg | ORAL_TABLET | Freq: Every day | ORAL | Status: DC
  Administered 2021-01-21: 100 mg via ORAL
  Filled 2021-01-21: qty 2

## 2021-01-21 MED ORDER — SODIUM CHLORIDE 0.9 % IV SOLN
200.0000 mg | Freq: Once | INTRAVENOUS | Status: AC
  Administered 2021-01-21: 200 mg via INTRAVENOUS
  Filled 2021-01-21: qty 200

## 2021-01-21 NOTE — ED Provider Notes (Addendum)
[facility] Emergency Department Provider Note  ____________________________________________  Time seen: Approximately 1:23 AM  I have reviewed the triage vital signs and the nursing notes.   HISTORY  Chief Complaint Chest Pain and Palpitations   HPI Wanda Aguilar is a 55 y.o. female with a history of hypertension who presents for evaluation of chest pain and palpitations.  Patient reports that her entire family tested positive for Covid in the beginning of the month.  She also had symptoms but her test was negative.  She reports that she has been recovering for the last week.  Was feeling well when she went to bed last night.  She woke up around 1130 PM complaining of tightness in her chest and palpitations. No fevers, no vomiting or diarrhea, no shortness of breath, no cough.  She denies any prior history of A. fib or cardiac or lung disease.  She denies any personal family history of blood clots, no recent travel immobilization, no leg pain or swelling, no hemoptysis or exogenous hormones.  She is a smoker.  When EMS arrived patient was in A. fib with a rate in the 170s.  They gave her 5 mg of IV labetalol and 400 mg of normal saline in route.   Past Medical History:  Diagnosis Date  . Fibroids   . Frequent headaches   . Hypertension   . Urine incontinence     Patient Active Problem List   Diagnosis Date Noted  . Atrial fibrillation with rapid ventricular response ([place]) 01/21/2021  . Frequent headaches 09/13/2018  . Insomnia 10/11/2016  . Anxiety state 03/04/2014  . HTN (hypertension) 02/12/2014    Past Surgical History:  Procedure Laterality Date  . ABDOMINAL HYSTERECTOMY Bilateral 04/13/2014   Procedure: HYSTERECTOMY ABDOMINAL WITH BILATERAL SALPINGO OOPHORECTOMY;  Surgeon: Darlyn Chamber, MD;  Location: [facility];  Service: Gynecology;  Laterality: Bilateral;  . CARPAL TUNNEL RELEASE     Right Hand  . Ceaserean    . CESAREAN SECTION    .  GANGLION CYST EXCISION     Bilateral  . TOOTH EXTRACTION      Prior to Admission medications   Medication Sig Start Date End Date Taking? Authorizing Provider  amLODipine (NORVASC) 5 MG tablet TAKE 1 TABLET BY MOUTH EVERY DAY 12/12/20   Jearld Fenton, NP  buPROPion (WELLBUTRIN XL) 150 MG 24 hr tablet TAKE 1 TABLET BY MOUTH EVERY DAY 08/25/20   Jearld Fenton, NP  hydrochlorothiazide (MICROZIDE) 12.5 MG capsule TAKE 1 CAPSULE BY MOUTH EVERY DAY 12/29/20   Jearld Fenton, NP  Ibuprofen-Famotidine 800-26.6 MG TABS Take 1 tablet by mouth daily as needed. 08/09/20   Jearld Fenton, NP  losartan (COZAAR) 100 MG tablet TAKE 1 TABLET BY MOUTH EVERY DAY 12/29/20   Jearld Fenton, NP    Allergies Patient has no known allergies.  Family History  Problem Relation Age of Onset  . Diabetes Father   . Diabetes Paternal Aunt   . Diabetes Paternal Uncle   . Arthritis Maternal Grandmother   . Alcohol abuse Maternal Grandfather   . Diabetes Paternal Grandmother   . Diabetes Paternal Grandfather     Social History Social History   Tobacco Use  . Smoking status: Current Some Day Smoker    Packs/day: 0.30    Years: 4.00    Pack years: 1.20    Types: Cigarettes    Start date: 01/14/2009  . Smokeless tobacco: Never Used  Substance Use Topics  .  Alcohol use: No  . Drug use: No    Review of Systems  Constitutional: Negative for fever. Eyes: Negative for visual changes. ENT: Negative for sore throat. Neck: No neck pain  Cardiovascular: Negative for chest pain. + tightness and plapitations Respiratory: Negative for shortness of breath. Gastrointestinal: Negative for abdominal pain, vomiting or diarrhea. Genitourinary: Negative for dysuria. Musculoskeletal: Negative for back pain. Skin: Negative for rash. Neurological: Negative for headaches, weakness or numbness. Psych: No SI or HI  ____________________________________________   PHYSICAL EXAM:  VITAL SIGNS: ED Triage Vitals  Enc  Vitals Group     BP 01/21/21 0033 (!) 125/99     Pulse Rate 01/21/21 0033 (!) 154     Resp 01/21/21 0033 20     Temp 01/21/21 0033 97.8 F (36.6 C)     Temp src --      SpO2 01/21/21 0033 98 %     Weight 01/21/21 0026 185 lb (83.9 kg)     Height 01/21/21 0026 5\' 4"  (1.626 m)     Head Circumference --      Peak Flow --      Pain Score 01/21/21 0025 3     Pain Loc --      Pain Edu? --      Excl. in [place]? --     Constitutional: Alert and oriented. Well appearing and in no apparent distress. HEENT:      Head: Normocephalic and atraumatic.         Eyes: Conjunctivae are normal. Sclera is non-icteric.       Mouth/Throat: Mucous membranes are moist.       Neck: Supple with no signs of meningismus. Cardiovascular: Irregularly irregular rhythm with tachycardic rate Respiratory: Normal respiratory effort. Lungs are clear to auscultation bilaterally. No wheezes, crackles, or rhonchi.  Gastrointestinal: Soft, non tender. Musculoskeletal: No edema, cyanosis, or erythema of extremities. Neurologic: Normal speech and language. Face is symmetric. Moving all extremities. No gross focal neurologic deficits are appreciated. Skin: Skin is warm, dry and intact. No rash noted. Psychiatric: Mood and affect are normal. Speech and behavior are normal.  ____________________________________________   LABS (all labs ordered are listed, but only abnormal results are displayed)  Labs Reviewed  BASIC METABOLIC PANEL - Abnormal; Notable for the following components:      Result Value   Potassium 3.4 (*)    Glucose, Bld 176 (*)    All other components within normal limits  TROPONIN I (HIGH SENSITIVITY) - Abnormal; Notable for the following components:   Troponin I (High Sensitivity) 18 (*)    All other components within normal limits  SARS CORONAVIRUS 2 BY RT PCR (HOSPITAL ORDER, [facility])  CBC WITH DIFFERENTIAL/PLATELET  MAGNESIUM  TROPONIN I (HIGH SENSITIVITY)    ____________________________________________  EKG  ED ECG REPORT I, Rudene Re, the attending physician, personally viewed and interpreted this ECG.  Afib, rate 161, no ST elevations or depressions.  New when compared to prior ____________________________________________  RADIOLOGY  I have personally reviewed the images performed during this visit and I agree with the Radiologist's read.   Interpretation by Radiologist:  DG Chest Portable 1 View  Result Date: 01/21/2021 CLINICAL DATA:  Chest pain EXAM: PORTABLE CHEST 1 VIEW COMPARISON:  09/17/2016 FINDINGS: Cardiac shadow is within normal limits. Lungs are clear bilaterally. No bony abnormality is noted. IMPRESSION: No active disease. Electronically Signed   By: Inez Catalina M.D.   On: 01/21/2021 01:15  ____________________________________________   PROCEDURES  Procedure(s) performed:yes .1-3 Lead EKG Interpretation Performed by: Rudene Re, MD Authorized by: Rudene Re, MD     Interpretation: abnormal     ECG rate assessment: tachycardic     Rhythm: atrial fibrillation     Critical Care performed: yes  CRITICAL CARE Performed by: Rudene Re  ?  Total critical care time: 40 min  Critical care time was exclusive of separately billable procedures and treating other patients.  Critical care was necessary to treat or prevent imminent or life-threatening deterioration.  Critical care was time spent personally by me on the following activities: development of treatment plan with patient and/or surrogate as well as nursing, discussions with consultants, evaluation of patient's response to treatment, examination of patient, obtaining history from patient or surrogate, ordering and performing treatments and interventions, ordering and review of laboratory studies, ordering and review of radiographic studies, pulse oximetry and re-evaluation of patient's  condition.  ____________________________________________   INITIAL IMPRESSION / ASSESSMENT AND PLAN / ED COURSE   55 y.o. female with a history of hypertension who presents for evaluation of chest pain and palpitations that started at 11:30Pm this evening. Patient reports Covid like symptoms in the beginning of the month but reports resolved symptoms as of the last week.  Patient is found to be in A. fib with RVR with rate in the 150s.  Complaining of chest tightness.  No signs of ischemia on the EKG.  Has received 400 cc of fluids and a 5 mg of IV labetalol per EMS. Looks euvolemic.  Ddx dehydration in the setting of Covid, electrolyte derangements, cardiac etiology, new onset of A. fib, PE.  We will give IV magnesium and IV fluids before rate controlling agents in case patient is dehydrated and in order to avoid blocking her cardiac output response. Will check electrolytes, will cycle cardiac enzymes.  Will check Covid.  _________________________ 2:33 AM on 01/21/2021 ----------------------------------------- No significant electrolyte derangements to justify patient is A. fib with RVR.  Her Covid is pending.  After liter fluids, 2 g of IV magnesium, p.o. Cardizem, and 15 mg of IV Cardizem patient still with a heart rate in the 130s.  Will start patient on Cardizem drip and admit to the hospitalist service  _________________________ 3:40 AM on 01/21/2021 -----------------------------------------  Patient is Covid positive.      _____________________________________________ Please note:  Patient was evaluated in Emergency Department today for the symptoms described in the history of present illness. Patient was evaluated in the context of the global COVID-19 pandemic, which necessitated consideration that the patient might be at risk for infection with the SARS-CoV-2 virus that causes COVID-19. Institutional protocols and algorithms that pertain to the evaluation of patients at risk for  COVID-19 are in a state of rapid change based on information released by regulatory bodies including the CDC and federal and state organizations. These policies and algorithms were followed during the patient's care in the ED.  Some ED evaluations and interventions may be delayed as a result of limited staffing during the pandemic.   [place] Controlled Substance Database was reviewed by me. ____________________________________________   FINAL CLINICAL IMPRESSION(S) / ED DIAGNOSES   Final diagnoses:  Atrial fibrillation with RVR ([place])  COVID-19      NEW MEDICATIONS STARTED DURING THIS VISIT:  ED Discharge Orders    None       Note:  This document was prepared using Dragon voice recognition software and may include unintentional dictation errors.  Alfred Levins, [place], MD 01/21/21 Marton Redwood, [place], MD 01/21/21 [phone]

## 2021-01-21 NOTE — H&P (Signed)
[place]   PATIENT NAME: Wanda Aguilar    MR#:  [id number]  DATE OF BIRTH:  1966-07-01  DATE OF ADMISSION:  01/21/2021  PRIMARY CARE PHYSICIAN: Jearld Fenton, NP   REQUESTING/REFERRING PHYSICIAN: Rudene Re, MD  CHIEF COMPLAINT:   Chief Complaint  Patient presents with  . Chest Pain  . Palpitations    HISTORY OF PRESENT ILLNESS:  Wanda Aguilar  is a 55 y.o. Caucasian female with a known history of hypertension, who presented to the emergency room with acute onset of chest pain and palpitations.  3 members the patient's family had  positive Covid tests in the beginning of the month after going to a funeral in [place] however the patient tested negative despite having symptoms with minimal dyspnea and cough that is still lingering.  She has almost recovered last week.  She woke up around 8:30 PM with chest tightness and palpitations with no diaphoresis.  No nausea or vomiting or abdominal pain.  No leg pain, worsening edema and no recent travels or surgery.  No fever or chills.  No bleeding diathesis.  Upon presentation to the ER, blood pressure was 125/99 with a heart rate of 154 otherwise normal vital signs.  Labs revealed potassium of 3.4 magnesium was 2.3 with a blood glucose of 176.  High-sensitivity troponin I was 18 and later 38.  COVID-19 PCR came back positive.  Chest x-ray showed no acute cardiopulmonary disease.  EKG showed Atrial fibrillation with rapid response 161.  The patient was given 50 mg of IV Cardizem bolus, 40 mEq p.o. potassium chloride, 2 g of IV magnesium sulfate, 1 L bolus of IV lactated Ringer, 180 mg p.o. Cardizem CD and later was started on IV Cardizem drip.  Heart rate is currently down to 121.  We will start her on p.o. Eliquis.[name]  She will be admitted to a progressive unit bed for further evaluation and management.  PAST MEDICAL HISTORY:   Past Medical History:  Diagnosis Date  . Fibroids   . Frequent headaches   . Hypertension    . Urine incontinence     PAST SURGICAL HISTORY:   Past Surgical History:  Procedure Laterality Date  . ABDOMINAL HYSTERECTOMY Bilateral 04/13/2014   Procedure: HYSTERECTOMY ABDOMINAL WITH BILATERAL SALPINGO OOPHORECTOMY;  Surgeon: Darlyn Chamber, MD;  Location: [facility];  Service: Gynecology;  Laterality: Bilateral;  . CARPAL TUNNEL RELEASE     Right Hand  . Ceaserean    . CESAREAN SECTION    . GANGLION CYST EXCISION     Bilateral  . TOOTH EXTRACTION      SOCIAL HISTORY:   Social History   Tobacco Use  . Smoking status: Current Some Day Smoker    Packs/day: 0.30    Years: 4.00    Pack years: 1.20    Types: Cigarettes    Start date: 01/14/2009  . Smokeless tobacco: Never Used  Substance Use Topics  . Alcohol use: No    FAMILY HISTORY:   Family History  Problem Relation Age of Onset  . Diabetes Father   . Diabetes Paternal Aunt   . Diabetes Paternal Uncle   . Arthritis Maternal Grandmother   . Alcohol abuse Maternal Grandfather   . Diabetes Paternal Grandmother   . Diabetes Paternal Grandfather     DRUG ALLERGIES:  No Known Allergies  REVIEW OF SYSTEMS:   ROS As per history of present illness. All pertinent systems were reviewed above. Constitutional, HEENT, cardiovascular, respiratory, GI,  GU, musculoskeletal, neuro, psychiatric, endocrine, integumentary and hematologic systems were reviewed and are otherwise negative/unremarkable except for positive findings mentioned above in the HPI.   MEDICATIONS AT HOME:   Prior to Admission medications   Medication Sig Start Date End Date Taking? Authorizing Provider  amLODipine (NORVASC) 5 MG tablet TAKE 1 TABLET BY MOUTH EVERY DAY 12/12/20   Jearld Fenton, NP  buPROPion (WELLBUTRIN XL) 150 MG 24 hr tablet TAKE 1 TABLET BY MOUTH EVERY DAY 08/25/20   Jearld Fenton, NP  hydrochlorothiazide (MICROZIDE) 12.5 MG capsule TAKE 1 CAPSULE BY MOUTH EVERY DAY 12/29/20   Jearld Fenton, NP  Ibuprofen-Famotidine 800-26.6 MG TABS  Take 1 tablet by mouth daily as needed. 08/09/20   Jearld Fenton, NP  losartan (COZAAR) 100 MG tablet TAKE 1 TABLET BY MOUTH EVERY DAY 12/29/20   Jearld Fenton, NP      VITAL SIGNS:  Blood pressure (!) 128/94, pulse (!) 42, temperature 97.8 F (36.6 C), resp. rate (!) 25, height 5\' 4"  (1.626 m), weight 83.9 kg, last menstrual period 03/22/2014, SpO2 95 %.  PHYSICAL EXAMINATION:  Physical Exam  GENERAL:  55 y.o.-year-old Caucasian female patient lying in the bed with no acute distress.  EYES: Pupils equal, round, reactive to light and accommodation. No scleral icterus. Extraocular muscles intact.  HEENT: Head atraumatic, normocephalic. Oropharynx and nasopharynx clear.  NECK:  Supple, no jugular venous distention. No thyroid enlargement, no tenderness.  LUNGS: Normal breath sounds bilaterally, no wheezing, rales,rhonchi or crepitation. No use of accessory muscles of respiration.  CARDIOVASCULAR: Irregularly irregular tachycardic rhythm, S1, S2 normal. No murmurs, rubs, or gallops.  ABDOMEN: Soft, nondistended, nontender. Bowel sounds present. No organomegaly or mass.  EXTREMITIES: No pedal edema, cyanosis, or clubbing.  NEUROLOGIC: Cranial nerves II through XII are intact. Muscle strength 5/5 in all extremities. Sensation intact. Gait not checked.  PSYCHIATRIC: The patient is alert and oriented x 3.  Normal affect and good eye contact. SKIN: No obvious rash, lesion, or ulcer.   LABORATORY PANEL:   CBC Recent Labs  Lab 01/21/21 0041  WBC 6.8  HGB 13.4  HCT 39.5  PLT 234   ------------------------------------------------------------------------------------------------------------------  Chemistries  Recent Labs  Lab 01/21/21 0041  NA 143  K 3.4*  CL 107  CO2 24  GLUCOSE 176*  BUN 14  CREATININE 0.63  CALCIUM 9.4  MG 2.3   ------------------------------------------------------------------------------------------------------------------  Cardiac Enzymes No results  for input(s): TROPONINI in the last 168 hours. ------------------------------------------------------------------------------------------------------------------  RADIOLOGY:  DG Chest Portable 1 View  Result Date: 01/21/2021 CLINICAL DATA:  Chest pain EXAM: PORTABLE CHEST 1 VIEW COMPARISON:  09/17/2016 FINDINGS: Cardiac shadow is within normal limits. Lungs are clear bilaterally. No bony abnormality is noted. IMPRESSION: No active disease. Electronically Signed   By: Inez Catalina M.D.   On: 01/21/2021 01:15      IMPRESSION AND PLAN:   1.  Atrial fibrillation with rapid ventricular response. -The patient will be admitted to a progressive unit bed. -We will follow troponin I's. -We will continue IV Cardizem drip. -I started her on p.o. Eliquis given CHA2DS2-VASc score of 2. -2D echo and a cardiology consult will be obtained in a.m. -I notified Dr. Garen Lah about the patient.  2.  COVID-19 infection without hypoxemia. -The patient will be placed on IV remdesivir -We will obtain and follow inflammatory markers. -The patient will be placed on vitamins D3 and C as well as zinc sulfate. -She will be on p.o. Eliquis.  3.  Hypokalemia. -Potassium will be replaced and magnesium sulfate was given in the ER.  4.  Essential hypertension. -The patient will be on IV Cardizem drip that can later be switched to Cardizem CD. -We will therefore hold off her Norvasc. -We will continue her Cozaar and HCTZ.  5.  Depression. -We will continue butyrin XL.  6.  DVT prophylaxis. -The patient will be on p.o. Eliquis.  All the records are reviewed and case discussed with ED provider. The plan of care was discussed in details with the patient (and family). I answered all questions. The patient agreed to proceed with the above mentioned plan. Further management will depend upon hospital course.   CODE STATUS: Full code  Status is: Inpatient  Remains inpatient appropriate  because:Hemodynamically unstable, Ongoing diagnostic testing needed not appropriate for outpatient work up, Unsafe d/c plan, IV treatments appropriate due to intensity of illness or inability to take PO and Inpatient level of care appropriate due to severity of illness   Dispo: The patient is from: Home              Anticipated d/c is to: Home              Anticipated d/c date is: 2 days              Patient currently is not medically stable to d/c.   Difficult to place patient No    TOTAL TIME TAKING CARE OF THIS PATIENT: 55 minutes.    Christel Mormon M.D on 01/21/2021 at 4:23 AM  [facility]   From 7 PM-7 AM, contact night-coverage [email or web address]  CC: Primary care physician; Jearld Fenton, NP

## 2021-01-21 NOTE — Progress Notes (Signed)
Remdesivir - Pharmacy Brief Note   O:  CXR: "No active disease." SpO2: 94-98% on RA   A/P:  Remdesivir 200 mg IVPB once followed by 100 mg IVPB daily x 4 days.   Renda Rolls, PharmD, [facility] 01/21/2021 6:51 AM

## 2021-01-21 NOTE — ED Notes (Signed)
In pts room to hang a new bag of diltiazem and found the existing infusing line disconnected. Looked to be approx. 1  hours worth on floor. Infusion restarted at 10 mg/hr. See VS.

## 2021-01-21 NOTE — Consult Note (Signed)
Cardiology Consultation:   Patient ID: Wanda Aguilar MRN: [id number]; DOB: [birth year]  Admit date: 01/21/2021 Date of Consult: 01/21/2021  Primary Care Provider: Jearld Fenton, NP [facility] Cardiologist: [facility], Dr. Oval Linsey rounding [place] Electrophysiologist:  None    Patient Profile:   Wanda Aguilar is a 55 y.o. female with a hx of HTN, current tobacco use, and current COVID-19 infection who is being seen today for the evaluation of new onset atrial fibrillation with RVR at the request of Dr. Starla Link.  History of Present Illness:   Wanda Aguilar is a 55 yo female with no previously known history of cardiac arrhythmia or cardiac disease.  Previous 2006 MPI, performed for exertional CP, and ruled low risk (see CV studies/scan). No prior history of atrial fibrillation.  No prior history of blood clots or DVTs.  She is a current smoker.  She reports her entire family tested positive for COVID-19 at the start January 2022 and after going to a funeral in [place].  The pt also had symptoms of COVID-19 with mild DOE and dry cough but subsequent COVID-19 test negative.  On 01/20/2021, she reportedly was not feeling well and went to bed.  She woke at approximately 11:30 PM with chest tightness and palpitations.  No shortness of breath, cough, fevers, emesis, diarrhea.  She called EMS, who reported the patient was in atrial fibrillation on arrival with rates in the 170s.  They gave her 5 mg of IV labetalol and 400 mg of normal saline in route.  In the ED, initial vitals significant for BP 125/99, ventricular rate 154 bpm.  Labs showed hypokalemia with K 3.4, mg 2.3, high-sensitivity troponin 18  38, glucose 176, COVID-19 PCR positive.  EKG showed atrial fibrillation with ventricular rate 161 bpm.  Chest x-ray was without active cardiopulmonary disease.  Since presenting to the ED, she has received IVF/LR, potassium and magnesium supplementation, and IV diltiazem bolus and  subsequent oral diltiazem 180mg , and Eliquis 5 mg twice daily.  HCTZ 12.5 mg, losartan 100 mg were continued for BP support. She is receiving remdesivir infusion.   Cardiology consulted.   Past Medical History:  Diagnosis Date  . Fibroids   . Frequent headaches   . Hypertension   . Urine incontinence     Past Surgical History:  Procedure Laterality Date  . ABDOMINAL HYSTERECTOMY Bilateral 04/13/2014   Procedure: HYSTERECTOMY ABDOMINAL WITH BILATERAL SALPINGO OOPHORECTOMY;  Surgeon: Darlyn Chamber, MD;  Location: [facility];  Service: Gynecology;  Laterality: Bilateral;  . CARPAL TUNNEL RELEASE     Right Hand  . Ceaserean    . CESAREAN SECTION    . GANGLION CYST EXCISION     Bilateral  . TOOTH EXTRACTION       Home Medications:  Prior to Admission medications   Medication Sig Start Date End Date Taking? Authorizing Provider  amLODipine (NORVASC) 5 MG tablet TAKE 1 TABLET BY MOUTH EVERY DAY 12/12/20   Jearld Fenton, NP  hydrochlorothiazide (MICROZIDE) 12.5 MG capsule TAKE 1 CAPSULE BY MOUTH EVERY DAY 12/29/20   Jearld Fenton, NP  Ibuprofen-Famotidine 800-26.6 MG TABS Take 1 tablet by mouth daily as needed. 08/09/20   Jearld Fenton, NP  losartan (COZAAR) 100 MG tablet TAKE 1 TABLET BY MOUTH EVERY DAY 12/29/20   Jearld Fenton, NP    Inpatient Medications: Scheduled Meds: . apixaban  5 mg Oral BID  . hydrochlorothiazide  12.5 mg Oral Daily  . losartan  100 mg  Oral Daily  . [START ON 01/22/2021] metoprolol succinate  50 mg Oral Daily   Continuous Infusions: . [START ON 01/22/2021] remdesivir 100 mg in NS 100 mL     PRN Meds: acetaminophen, albuterol, ALPRAZolam, ondansetron (ZOFRAN) IV, zolpidem  Allergies:   No Known Allergies  Social History:   Social History   Socioeconomic History  . Marital status: Divorced    Spouse name: Not on file  . Number of children: Not on file  . Years of education: Not on file  . Highest education level: Not on file  Occupational History   . Not on file  Tobacco Use  . Smoking status: Current Some Day Smoker    Packs/day: 0.30    Years: 4.00    Pack years: 1.20    Types: Cigarettes    Start date: 01/14/2009  . Smokeless tobacco: Never Used  Substance and Sexual Activity  . Alcohol use: No  . Drug use: No  . Sexual activity: Yes  Other Topics Concern  . Not on file  Social History Narrative  . Not on file   Social Determinants of Health   Financial Resource Strain: Not on file  Food Insecurity: Not on file  Transportation Needs: Not on file  Physical Activity: Not on file  Stress: Not on file  Social Connections: Not on file  Intimate Partner Violence: Not on file    Family History:   No previously known family history of heart dz or arrhythmia. Family History  Problem Relation Age of Onset  . Diabetes Father   . Diabetes Paternal Aunt   . Diabetes Paternal Uncle   . Arthritis Maternal Grandmother   . Alcohol abuse Maternal Grandfather   . Diabetes Paternal Grandmother   . Diabetes Paternal Grandfather      ROS:  Please see the history of present illness.  Review of Systems  Constitutional: Positive for malaise/fatigue.  Respiratory: Positive for cough and shortness of breath.   Cardiovascular: Positive for chest pain and palpitations.  All other systems reviewed and are negative.   All other ROS reviewed and negative.     Physical Exam/Data:   Vitals:   01/21/21 1030 01/21/21 1045 01/21/21 1100 01/21/21 1115  BP: 132/88 (!) 141/93 116/75 119/79  Pulse: 74 (!) 57 82 83  Resp: 19 20 18  (!) 22  Temp:      SpO2: 96% 96% (!) 80% 96%  Weight:      Height:        Intake/Output Summary (Last 24 hours) at 01/21/2021 1134 Last data filed at 01/21/2021 1117 Gross per 24 hour  Intake 1631.2 ml  Output --  Net 1631.2 ml   Last 3 Weights 01/21/2021 05/06/2020 09/12/2018  Weight (lbs) 185 lb 188 lb 190 lb  Weight (kg) 83.915 kg 85.276 kg 86.183 kg     Body mass index is 31.76 kg/m.  Physical  exam deferred to MD to reduce exposure during COVID19 pandemic.  EKG:  The EKG was personally reviewed and demonstrates:  atrial fibrillation with ventricular rate 161 bpm Telemetry:  Telemetry was personally reviewed and demonstrates:  Defer to MD review to reduce exposure.   Relevant CV Studies: 2006 MPI Rule nl study / low risk  Laboratory Data:  High Sensitivity Troponin:   Recent Labs  Lab 01/21/21 0041 01/21/21 0241  TROPONINIHS 18* 38*     Chemistry Recent Labs  Lab 01/21/21 0041 01/21/21 0529  NA 143 142  K 3.4* 4.0  CL 107 110  CO2 24 24  GLUCOSE 176* 194*  BUN 14 13  CREATININE 0.63 0.59  CALCIUM 9.4 8.7*  GFRNONAA >60 >60  ANIONGAP 12 8    No results for input(s): PROT, ALBUMIN, AST, ALT, ALKPHOS, BILITOT in the last 168 hours. Hematology Recent Labs  Lab 01/21/21 0041 01/21/21 0529  WBC 6.8 7.2  RBC 4.30 3.80*  HGB 13.4 12.1  HCT 39.5 35.5*  MCV 91.9 93.4  MCH 31.2 31.8  MCHC 33.9 34.1  RDW 12.5 12.5  PLT 234 232   BNPNo results for input(s): BNP, PROBNP in the last 168 hours.  DDimer No results for input(s): DDIMER in the last 168 hours.   Radiology/Studies:  DG Chest Portable 1 View  Result Date: 01/21/2021 CLINICAL DATA:  Chest pain EXAM: PORTABLE CHEST 1 VIEW COMPARISON:  09/17/2016 FINDINGS: Cardiac shadow is within normal limits. Lungs are clear bilaterally. No bony abnormality is noted. IMPRESSION: No active disease. Electronically Signed   By: Inez Catalina M.D.   On: 01/21/2021 01:15     Assessment and Plan:   1. New onset Atrial fibrillation with RVR --Reports chest pain/tightness and associated palpitations with new onset atrial fibrillation with RVR at presentation to the ED.  No previously known history of atrial fibrillation or cardiac disease. Likely triggered by COVID19 infection.  Discontinue diltiazem 180mg  daily.  Given EF currently unknown, recommend transition to metoprolol for rate control until obtain echo.  Start  Toprol 50mg  daily (first dose 1/30).  Titrate as needed for goal ventricular rate less than 110 bpm.  Continue Eliquis 5mg  BID for [place].  CHA2DS2VASc score of at least 2 (HTN, female) with recommendation for indefinite anticoagulation to reduce the risk of thromboembolic event.  Daily BMET. Replete electrolytes as needed.  Will order TSH.  Previous 04/2020 TSH 0.70.  Daily CBC.  Echo recommended once rate controlled and recovered from [place]erm[place].    Will defer for now, given likely suboptimal pictures in the setting of RVR and to reduce exposure.  If EF reduced, recommend further ischemic work-up at that time.  Avoid diltiazem with unknown EF as above.  No current plan for TEE/DCCV this admission. If remains in atrial fibrillation with RVR, consider outpatient DCCV once therapeutic on anticoagulation.   2. Elevated high-sensitivity troponin, supply demand ischemia   Reports CP in the setting of RVR.  High-sensitivity troponin minimally elevated and flat trending.  EKG without acute ST/T changes.  Not consistent with ACS.  Suspect supply demand ischemia in the setting of RVR.  Once ventricular rates are well controlled and recovered from COVID-19 infection, recommend echo to assess EF, wall motion, and rule out acute structural abnormalities.  3. COVID-19 infection  Per IM.  4. Hypertension   Continue current antihypertensives.    Risk Assessment/Risk Scores:     TIMI Risk Score for Unstable Angina or Non-ST Elevation MI:   The patient's TIMI risk score is 1, which indicates a 5% risk of all cause mortality, new or recurrent myocardial infarction or need for urgent revascularization in the next 14 days.    CHA2DS2-VASc Score = 2  This indicates a 2.2% annual risk of stroke. The patient's score is based upon: CHF History: No HTN History: Yes Diabetes History: No Stroke History: No Vascular Disease History: No Age Score: 0 Gender Score: 1          For questions or  updates, please contact [place] Please consult [email or web address] for contact info under    Signed, Arvil Chaco,  PA-C  01/21/2021 11:34 AM

## 2021-01-21 NOTE — ED Notes (Signed)
Pt noted to become intermittently tachycardic 107-121 in A-fib, this RN messaged Cardiology regarding patient's Diltiazem drip and D/C'ing dilt drip. Awaiting to hear from cardiology if still need to D/C drip.

## 2021-01-21 NOTE — ED Triage Notes (Addendum)
Pt woke up around 2330 last night wuth CP and called EMS. EMS reports pt was in AFIB RVR with a rate of 140-170 on arrival. EMS gave 5mg  of labetolol and 452ml of NS in route. Pt states  CP has improved. Pt denies any hx of Afib.

## 2021-01-21 NOTE — Progress Notes (Signed)
Patient ID: Wanda Aguilar, female   DOB: Oct 16, 1966, 55 y.o.   MRN: [id number] Patient admitted early this morning for chest pain and palpitations and was found to have A. fib with RVR requiring Cardizem drip and also COVID-19 infection without hypoxia.  Patient seen and examined at bedside and plan of care discussed with her.  I have reviewed her medical records including this morning's H&P, vitals, labs and medications myself.  Cardiology consultation is pending. Heart rate is currently improved on Cardizem drip.  Currently on room air.  Monitor daily inflammatory markers.

## 2021-01-22 DIAGNOSIS — I4891 Unspecified atrial fibrillation: Secondary | ICD-10-CM

## 2021-01-22 DIAGNOSIS — U071 COVID-19: Secondary | ICD-10-CM | POA: Diagnosis not present

## 2021-01-22 DIAGNOSIS — I10 Essential (primary) hypertension: Secondary | ICD-10-CM

## 2021-01-22 LAB — COMPREHENSIVE METABOLIC PANEL
ALT: 54 U/L — ABNORMAL HIGH (ref 0–44)
AST: 24 U/L (ref 15–41)
Albumin: 3.9 g/dL (ref 3.5–5.0)
Alkaline Phosphatase: 66 U/L (ref 38–126)
Anion gap: 9 (ref 5–15)
BUN: 15 mg/dL (ref 6–20)
CO2: 25 mmol/L (ref 22–32)
Calcium: 9 mg/dL (ref 8.9–10.3)
Chloride: 105 mmol/L (ref 98–111)
Creatinine, Ser: 0.68 mg/dL (ref 0.44–1.00)
GFR, Estimated: >60 mL/min (ref >60)
Glucose, Bld: 135 mg/dL — ABNORMAL HIGH (ref 70–99)
Potassium: 3.6 mmol/L (ref 3.5–5.1)
Sodium: 139 mmol/L (ref 135–145)
Total Bilirubin: 0.7 mg/dL (ref 0.3–1.2)
Total Protein: 6.2 g/dL — ABNORMAL LOW (ref 6.5–8.1)

## 2021-01-22 LAB — CBC WITH DIFFERENTIAL/PLATELET
Abs Immature Granulocytes: 0.04 10*3/uL (ref 0.00–0.07)
Basophils Absolute: 0.1 10*3/uL (ref 0.0–0.1)
Basophils Relative: 1 %
Eosinophils Absolute: 0.1 10*3/uL (ref 0.0–0.5)
Eosinophils Relative: 1 %
HCT: 36.5 % (ref 36.0–46.0)
Hemoglobin: 12.1 g/dL (ref 12.0–15.0)
Immature Granulocytes: 1 %
Lymphocytes Relative: 24 %
Lymphs Abs: 2.1 10*3/uL (ref 0.7–4.0)
MCH: 31.4 pg (ref 26.0–34.0)
MCHC: 33.2 g/dL (ref 30.0–36.0)
MCV: 94.8 fL (ref 80.0–100.0)
Monocytes Absolute: 0.6 10*3/uL (ref 0.1–1.0)
Monocytes Relative: 7 %
Neutro Abs: 5.8 10*3/uL (ref 1.7–7.7)
Neutrophils Relative %: 66 %
Platelets: 227 10*3/uL (ref 150–400)
RBC: 3.85 MIL/uL — ABNORMAL LOW (ref 3.87–5.11)
RDW: 12.6 % (ref 11.5–15.5)
WBC: 8.8 10*3/uL (ref 4.0–10.5)
nRBC: 0 % (ref 0.0–0.2)

## 2021-01-22 LAB — FIBRIN DERIVATIVES D-DIMER (ARMC ONLY): Fibrin derivatives D-dimer (ARMC): 272.03 ng/mL (FEU) (ref 0.00–499.00)

## 2021-01-22 LAB — MAGNESIUM: Magnesium: 2.3 mg/dL (ref 1.7–2.4)

## 2021-01-22 LAB — TSH: TSH: 0.849 u[IU]/mL (ref 0.350–4.500)

## 2021-01-22 LAB — C-REACTIVE PROTEIN: CRP: 1.6 mg/dL — ABNORMAL HIGH (ref <1.0)

## 2021-01-22 LAB — FERRITIN: Ferritin: 157 ng/mL (ref 11–307)

## 2021-01-22 MED ORDER — POTASSIUM CHLORIDE CRYS ER 20 MEQ PO TBCR: 20.0000 meq | EXTENDED_RELEASE_TABLET | Freq: Every day | ORAL | 0 refills | Status: DC

## 2021-01-22 MED ORDER — ALBUTEROL SULFATE HFA 108 (90 BASE) MCG/ACT IN AERS: 2.0000 | INHALATION_SPRAY | RESPIRATORY_TRACT | 0 refills | Status: DC | PRN

## 2021-01-22 MED ORDER — HYDROCHLOROTHIAZIDE 12.5 MG PO CAPS: 12.5000 mg | ORAL_CAPSULE | Freq: Every day | ORAL | Status: DC

## 2021-01-22 MED ORDER — POTASSIUM CHLORIDE CRYS ER 20 MEQ PO TBCR
40.0000 meq | EXTENDED_RELEASE_TABLET | Freq: Once | ORAL | Status: AC
  Administered 2021-01-22: 40 meq via ORAL
  Filled 2021-01-22: qty 2

## 2021-01-22 MED ORDER — IBUPROFEN 400 MG PO TABS
400.0000 mg | ORAL_TABLET | Freq: Four times a day (QID) | ORAL | Status: DC | PRN
  Administered 2021-01-22: 400 mg via ORAL
  Filled 2021-01-22: qty 1

## 2021-01-22 MED ORDER — DILTIAZEM HCL ER COATED BEADS 240 MG PO CP24: 240.0000 mg | ORAL_CAPSULE | Freq: Every day | ORAL | 0 refills | Status: DC

## 2021-01-22 MED ORDER — POTASSIUM CHLORIDE CRYS ER 20 MEQ PO TBCR: 20.0000 meq | EXTENDED_RELEASE_TABLET | Freq: Every day | ORAL | Status: DC

## 2021-01-22 MED ORDER — LOSARTAN POTASSIUM 50 MG PO TABS: 100.0000 mg | ORAL_TABLET | Freq: Every day | ORAL | Status: DC

## 2021-01-22 MED ORDER — APIXABAN 5 MG PO TABS: 5.0000 mg | ORAL_TABLET | Freq: Two times a day (BID) | ORAL | 0 refills | Status: DC

## 2021-01-22 NOTE — Progress Notes (Signed)
Wanda Aguilar to be D/C'd Home per MD order.  Discussed prescriptions and follow up appointments with the patient. Prescriptions electronically submitted, medication list explained in detail. Pt verbalized understanding.  Allergies as of 01/22/2021   No Known Allergies     Medication List    STOP taking these medications   amLODipine 5 MG tablet Commonly known as: NORVASC     TAKE these medications   albuterol 108 (90 Base) MCG/ACT inhaler Commonly known as: VENTOLIN HFA Inhale 2 puffs into the lungs every 4 (four) hours as needed for wheezing or shortness of breath.   apixaban 5 MG Tabs tablet Commonly known as: ELIQUIS Take 1 tablet (5 mg total) by mouth 2 (two) times daily.   diltiazem 240 MG 24 hr capsule Commonly known as: CARDIZEM CD Take 1 capsule (240 mg total) by mouth daily. Start taking on: January 23, 2021   hydrochlorothiazide 12.5 MG capsule Commonly known as: MICROZIDE TAKE 1 CAPSULE BY MOUTH EVERY DAY   Ibuprofen-Famotidine 800-26.6 MG Tabs Take 1 tablet by mouth daily as needed.   losartan 100 MG tablet Commonly known as: COZAAR TAKE 1 TABLET BY MOUTH EVERY DAY   potassium chloride SA 20 MEQ tablet Commonly known as: KLOR-CON Take 1 tablet (20 mEq total) by mouth daily. Start taking on: January 23, 2021       Vitals:   01/22/21 0300 01/22/21 0912  BP: 124/85 (!) 158/96  Pulse: 69 72  Resp: 18 18  Temp: 98.5 F (36.9 C) 98.1 F (36.7 C)  SpO2: 99% 98%    Skin clean, dry and intact without evidence of skin break down, no evidence of skin tears noted. IV catheter discontinued intact. Site without signs and symptoms of complications. Dressing and pressure applied. Pt denies pain at this time. No complaints noted.  An After Visit Summary was printed and given to the patient. Patient escorted via [place], and D/C home via private auto.  [place]

## 2021-01-22 NOTE — Discharge Summary (Signed)
Physician Discharge Summary  Wanda Aguilar OFB:[id number] DOB: 1966-07-20 DOA: 01/21/2021  PCP: Jearld Fenton, NP  Admit date: 01/21/2021 Discharge date: 01/22/2021  Admitted From: Home Disposition: Home  Recommendations for Outpatient Follow-up:  1. Follow up with PCP in 1 week with repeat CBC/BMP 2. Outpatient follow-up with cardiology 3. Follow up in ED if symptoms worsen or new appear   Home Health: No Equipment/Devices: None  Discharge Condition: Stable CODE STATUS: Full Diet recommendation: Heart healthy  Brief/Interim Summary: 55 year old female with history of hypertension presented with chest pain and palpitations.  On presentation, she was in A. fib with RVR requiring Cardizem drip.  She tested, positive for Covid.  Cardiology was consulted.  During the hospitalization, her heart rate improved and she was transitioned to oral Cardizem and oral metoprolol along with Eliquis.  Her respiratory status is stable on room air with no infiltrates on chest x-ray.  She feels better and heart rate is controlled.  She will be discharged home today.  Echo will be done as an outpatient with outpatient follow-up with cardiology.  Discharge Diagnoses:   Paroxysmal A. fib with RVR -Initially started on Cardizem drip which has been subsequently switched to oral Cardizem and metoprolol by cardiology.  Also on oral Eliquis. -Heart rate much improved. Echo will be done as an outpatient with outpatient follow-up with cardiology. -Cardiology has cleared the patient for discharge on Eliquis and Cardizem.  Metoprolol has been discontinued.  COVID-19 infection -Chest x-ray negative on presentation.  Respiratory status stable except for intermittent dry cough.  Currently on room air. -Inflammatory markers not significantly elevated; CRP only 1.6 this morning.  Did not treat with remdesivir or steroids. -Continue isolation upon discharge to complete 10-day of isolation  Essential  hypertension -Blood pressures slightly on the higher side.  Continue Cardizem, losartan and hydrochlorothiazide.  Amlodipine has been discontinued by cardiology.  Depression -Continue home regimen  Hypokalemia -Resolved.  Continue oral supplementation on discharge as patient will be on hydrochlorothiazide.  Outpatient follow-up of BMP  Discharge Instructions  Discharge Instructions    Amb referral to AFIB Clinic   Complete by: As directed      Allergies as of 01/22/2021   No Known Allergies     Medication List    STOP taking these medications   amLODipine 5 MG tablet Commonly known as: NORVASC     TAKE these medications   albuterol 108 (90 Base) MCG/ACT inhaler Commonly known as: VENTOLIN HFA Inhale 2 puffs into the lungs every 4 (four) hours as needed for wheezing or shortness of breath.   apixaban 5 MG Tabs tablet Commonly known as: ELIQUIS Take 1 tablet (5 mg total) by mouth 2 (two) times daily.   diltiazem 240 MG 24 hr capsule Commonly known as: CARDIZEM CD Take 1 capsule (240 mg total) by mouth daily. Start taking on: January 23, 2021   hydrochlorothiazide 12.5 MG capsule Commonly known as: MICROZIDE TAKE 1 CAPSULE BY MOUTH EVERY DAY   Ibuprofen-Famotidine 800-26.6 MG Tabs Take 1 tablet by mouth daily as needed.   losartan 100 MG tablet Commonly known as: COZAAR TAKE 1 TABLET BY MOUTH EVERY DAY   potassium chloride SA 20 MEQ tablet Commonly known as: KLOR-CON Take 1 tablet (20 mEq total) by mouth daily. Start taking on: January 23, 2021        Follow-up Information    Jearld Fenton, NP. Schedule an appointment as soon as possible for a visit in 1 week(s).   Specialties:  Internal Medicine, Emergency Medicine Why: With repeat CBC/BMP Contact information: [place] [id number] [phone]              No Known Allergies  Consultations:  Cardiology   Procedures/Studies: DG Chest Portable 1 View  Result  Date: 01/21/2021 CLINICAL DATA:  Chest pain EXAM: PORTABLE CHEST 1 VIEW COMPARISON:  09/17/2016 FINDINGS: Cardiac shadow is within normal limits. Lungs are clear bilaterally. No bony abnormality is noted. IMPRESSION: No active disease. Electronically Signed   By: Inez Catalina M.D.   On: 01/21/2021 01:15       Subjective: Patient seen and examined at bedside.  Denies any chest pain or palpitations.  Still has intermittent dry cough.  Feels okay to go home today.  No overnight fever or vomiting reported.  Discharge Exam: Vitals:   01/22/21 0300 01/22/21 0912  BP: 124/85 (!) 158/96  Pulse: 69 72  Resp: 18 18  Temp: 98.5 F (36.9 C) 98.1 F (36.7 C)  SpO2: 99% 98%    General: Pt is alert, awake, not in acute distress.  On room air Cardiovascular: rate controlled, S1/S2 + Respiratory: bilateral decreased breath sounds at bases with some scattered crackles Abdominal: Soft, NT, ND, bowel sounds + Extremities: Trace lower extremity edema; no cyanosis    The results of significant diagnostics from this hospitalization (including imaging, microbiology, ancillary and laboratory) are listed below for reference.     Microbiology: Recent Results (from the past 240 hour(s))  SARS Coronavirus 2 by RT PCR (hospital order, performed in [facility] hospital lab) Nasopharyngeal Nasopharyngeal Swab     Status: Abnormal   Collection Time: 01/21/21  1:05 AM   Specimen: Nasopharyngeal Swab  Result Value Ref Range Status   SARS Coronavirus 2 POSITIVE (A) NEGATIVE Final    Comment: RESULT CALLED TO, READ BACK BY AND VERIFIED WITH:  JENNIFER AGNEW AT 2440 01/21/21 JG/SDR (NOTE) SARS-CoV-2 target nucleic acids are DETECTED  SARS-CoV-2 RNA is generally detectable in upper respiratory specimens  during the acute phase of infection.  Positive results are indicative  of the presence of the identified virus, but do not rule out bacterial infection or co-infection with other pathogens not detected by  the test.  Clinical correlation with patient history and  other diagnostic information is necessary to determine patient infection status.  The expected result is negative.  Fact Sheet for Patients:   StrictlyIdeas.no   Fact Sheet for Healthcare Providers:   BankingDealers.co.za    This test is not yet approved or cleared by the [place] FDA and  has been authorized for detection and/or diagnosis of SARS-CoV-2 by FDA under an Emergency Use Authorization (EUA).  This EUA will remain in effect (meaning thi s test can be used) for the duration of  the COVID-19 declaration under Section 564(b)(1) of the Act, 21 U.S.C. section 360-bbb-3(b)(1), unless the authorization is terminated or revoked sooner.  Performed at [facility], [address] [place], [place] [address]      Labs: BNP (last 3 results) No results for input(s): BNP in the last 8760 hours. Basic Metabolic Panel: Recent Labs  Lab 01/21/21 0041 01/21/21 0529 01/22/21 0339  NA 143 142 139  K 3.4* 4.0 3.6  CL 107 110 105  CO2 24 24 25   GLUCOSE 176* 194* 135*  BUN 14 13 15   CREATININE 0.63 0.59 0.68  CALCIUM 9.4 8.7* 9.0  MG 2.3  --  2.3   Liver Function Tests: Recent Labs  Lab 01/22/21 0339  AST 24  ALT 54*  ALKPHOS 66  BILITOT 0.7  PROT 6.2*  ALBUMIN 3.9   No results for input(s): LIPASE, AMYLASE in the last 168 hours. No results for input(s): AMMONIA in the last 168 hours. CBC: Recent Labs  Lab 01/21/21 0041 01/21/21 0529 01/22/21 0339  WBC 6.8 7.2 8.8  NEUTROABS 3.3  --  5.8  HGB 13.4 12.1 12.1  HCT 39.5 35.5* 36.5  MCV 91.9 93.4 94.8  PLT 234 232 227   Cardiac Enzymes: No results for input(s): CKTOTAL, CKMB, CKMBINDEX, TROPONINI in the last 168 hours. BNP: Invalid input(s): POCBNP CBG: No results for input(s): GLUCAP in the last 168 hours. D-Dimer No results for input(s): DDIMER in the last 72 hours. Hgb A1c No results  for input(s): HGBA1C in the last 72 hours. Lipid Profile No results for input(s): CHOL, HDL, LDLCALC, TRIG, CHOLHDL, LDLDIRECT in the last 72 hours. Thyroid function studies Recent Labs    01/22/21 0339  TSH 0.849   Anemia work up Recent Labs    01/21/21 0529 01/22/21 0339  FERRITIN 184 157   Urinalysis No results found for: COLORURINE, APPEARANCEUR, LABSPEC, [place], GLUCOSEU, HGBUR, BILIRUBINUR, KETONESUR, PROTEINUR, UROBILINOGEN, NITRITE, LEUKOCYTESUR Sepsis Labs Invalid input(s): PROCALCITONIN,  WBC,  LACTICIDVEN Microbiology Recent Results (from the past 240 hour(s))  SARS Coronavirus 2 by RT PCR (hospital order, performed in [facility] hospital lab) Nasopharyngeal Nasopharyngeal Swab     Status: Abnormal   Collection Time: 01/21/21  1:05 AM   Specimen: Nasopharyngeal Swab  Result Value Ref Range Status   SARS Coronavirus 2 POSITIVE (A) NEGATIVE Final    Comment: RESULT CALLED TO, READ BACK BY AND VERIFIED WITH:  JENNIFER AGNEW AT 0249 01/21/21 JG/SDR (NOTE) SARS-CoV-2 target nucleic acids are DETECTED  SARS-CoV-2 RNA is generally detectable in upper respiratory specimens  during the acute phase of infection.  Positive results are indicative  of the presence of the identified virus, but do not rule out bacterial infection or co-infection with other pathogens not detected by the test.  Clinical correlation with patient history and  other diagnostic information is necessary to determine patient infection status.  The expected result is negative.  Fact Sheet for Patients:   StrictlyIdeas.no   Fact Sheet for Healthcare Providers:   BankingDealers.co.za    This test is not yet approved or cleared by the [place] FDA and  has been authorized for detection and/or diagnosis of SARS-CoV-2 by FDA under an Emergency Use Authorization (EUA).  This EUA will remain in effect (meaning thi s test can be used) for the duration of   the COVID-19 declaration under Section 564(b)(1) of the Act, 21 U.S.C. section 360-bbb-3(b)(1), unless the authorization is terminated or revoked sooner.  Performed at [facility], [address] [place], [place] [address]      Time coordinating discharge: 35 minutes  SIGNED:   Aline August, MD  [facility] 01/22/2021, 10:55 AM

## 2021-01-22 NOTE — Progress Notes (Signed)
Progress Note  Patient Name: Wanda Aguilar Date of Encounter: 01/22/2021  Primary Cardiologist: [facility], Dr. Oval Linsey rounding  Subjective   Called into the room to reduce exposure to COVID-19.    States that she is feeling well today.  She feels improved from time of admission.    No chest pain. Feels tired and short of breath, though she reports someone told her her oxygen saturations are currently fine.  No racing heart rate or feelings of skipped heartbeats.  We discussed the diagnosis of atrial fibrillation and the importance of taking her blood thinner.  She expressed some concern regarding breakthrough tachypalpitations.  We discussed that this can be controlled via medication.  For example, we discussed that if her EF is okay on her echo, we could certainly prescribe her with short acting Cardizem for breakthrough tachypalpitations if these occur in the future.  She states that she has not yet had her echo.  Plan is for echo today.  Inpatient Medications    Scheduled Meds: . apixaban  5 mg Oral BID  . diltiazem  240 mg Oral Daily  . losartan  50 mg Oral Daily  . metoprolol tartrate  25 mg Oral Q8H   Continuous Infusions: . remdesivir 100 mg in NS 100 mL     PRN Meds: acetaminophen, albuterol, ALPRAZolam, ondansetron (ZOFRAN) IV, zolpidem   Vital Signs    Vitals:   01/21/21 2040 01/21/21 2331 01/22/21 0105 01/22/21 0300  BP: (!) 148/84 118/78  124/85  Pulse: 69 60  69  Resp: 19 19  18   Temp: 98.6 F (37 C) (!) 97.5 F (36.4 C)  98.5 F (36.9 C)  TempSrc: Oral Axillary  Oral  SpO2: 97% 100%  99%  Weight:   84.9 kg   Height:        Intake/Output Summary (Last 24 hours) at 01/22/2021 0820 Last data filed at 01/21/2021 1217 Gross per 24 hour  Intake 696.53 ml  Output --  Net 696.53 ml   Last 3 Weights 01/22/2021 01/21/2021 05/06/2020  Weight (lbs) 187 lb 1.6 oz 185 lb 188 lb  Weight (kg) 84.868 kg 83.915 kg 85.276 kg      Telemetry    Currently  NSR, 60s - Personally Reviewed  ECG    No new tracings - Personally Reviewed  Physical Exam   Defer to MD to reduce exposure during COVID-19 pandemic.  Labs    High Sensitivity Troponin:   Recent Labs  Lab 01/21/21 0041 01/21/21 0241  TROPONINIHS 18* 38*      Cardiac EnzymesNo results for input(s): TROPONINI in the last 168 hours. No results for input(s): TROPIPOC in the last 168 hours.   Chemistry Recent Labs  Lab 01/21/21 0041 01/21/21 0529 01/22/21 0339  NA 143 142 139  K 3.4* 4.0 3.6  CL 107 110 105  CO2 24 24 25   GLUCOSE 176* 194* 135*  BUN 14 13 15   CREATININE 0.63 0.59 0.68  CALCIUM 9.4 8.7* 9.0  PROT  --   --  6.2*  ALBUMIN  --   --  3.9  AST  --   --  24  ALT  --   --  54*  ALKPHOS  --   --  66  BILITOT  --   --  0.7  GFRNONAA >60 >60 >60  ANIONGAP 12 8 9      Hematology Recent Labs  Lab 01/21/21 0041 01/21/21 0529 01/22/21 0339  WBC 6.8 7.2 8.8  RBC  4.30 3.80* 3.85*  HGB 13.4 12.1 12.1  HCT 39.5 35.5* 36.5  MCV 91.9 93.4 94.8  MCH 31.2 31.8 31.4  MCHC 33.9 34.1 33.2  RDW 12.5 12.5 12.6  PLT 234 232 227    BNPNo results for input(s): BNP, PROBNP in the last 168 hours.   DDimer No results for input(s): DDIMER in the last 168 hours.   Radiology    DG Chest Portable 1 View  Result Date: 01/21/2021 CLINICAL DATA:  Chest pain EXAM: PORTABLE CHEST 1 VIEW COMPARISON:  09/17/2016 FINDINGS: Cardiac shadow is within normal limits. Lungs are clear bilaterally. No bony abnormality is noted. IMPRESSION: No active disease. Electronically Signed   By: Inez Catalina M.D.   On: 01/21/2021 01:15    Cardiac Studies   Echo ordered  Patient Profile     55 y.o. female with a hx of HTN, current tobacco use, and current COVID-19 infection who is being seen today for the evaluation of new onset atrial fibrillation with RVR.  Assessment & Plan    1. New onset Atrial fibrillation with RVR --No previously known history of atrial fibrillation or cardiac  disease. Likely triggered by COVID19 infection.  Metoprolol tartrate for rate control. ? Titrate as needed for goal ventricular rate less than 110 bpm. ? Consolidate to Toprol before discharge. ? If EF preserved as below, OK to restart diltiazem.  Eliquis 5mg  BID for [place]. ? CHA2DS2VASc score of at least 2 (HTN, female) with recommendation for indefinite anticoagulation to reduce the risk of thromboembolic event.  BMET daily.   Replete electrolytes as needed. Ordered KCl tab 5mEq this AM  TSH pending. 04/2020 TSH 0.70.  CBC daily.  Echo ordered, pending, to assess EF ? If EF reduced, recommend further ischemic work-up at that time. ? If EF preserved, may restart diltiazem and up-titrate as indicated.  No plan for TEE/DCCV this admission. If remains in atrial fibrillation with RVR, consider outpatient DCCV once therapeutic on anticoagulation.   2. Elevated high-sensitivity troponin, supply demand ischemia   Reports CP in the setting of RVR.  High-sensitivity troponin minimally elevated and flat trending.  EKG without acute ST/T changes.  Not consistent with ACS.  Suspect supply demand ischemia in the setting of RVR.  Once ventricular rates are well controlled and recovered from COVID-19 infection, recommend echo to assess EF, wall motion, and rule out acute structural abnormalities.  3. Hypokalemia  KCl tab 76mEq ordered. Replete with goal 4.0.  4. COVID-19 infection  Per IM.  4. Hypertension   BP today 124/85.  Diastolic consistently elevated above goal and DBP 80-100 with SBP 140-118. HR 90-60s.  HCTZ held yesterday with Losartan reduced to 50mg .   With continued room in pressure, consider start of spironolactone, given her hypokalemia at presentation and during the admission and versus restarting HCTZ with daily potassium supplementation. May also increase Losartan back to PTA dosing of 100mg  daily. Will continue to monitor pressures for now and pending administration of  AM medications.    For questions or updates, please contact [place] Please consult [email or web address] for contact info under        Signed, Arvil Chaco, PA-C  01/22/2021, 8:20 AM

## 2021-01-23 ENCOUNTER — Telehealth: Payer: Self-pay

## 2021-01-23 DIAGNOSIS — I4891 Unspecified atrial fibrillation: Secondary | ICD-10-CM

## 2021-01-23 NOTE — Telephone Encounter (Signed)
Order placed for echo. Awaiting scheduling.

## 2021-01-23 NOTE — Telephone Encounter (Signed)
-----   Message from Arvil Chaco, PA-C sent at 01/22/2021 11:05 AM EST ----- Hello,  This patient was admitted and seen at [facility] for new onset Afib in the setting of COVID19  We are expecting discharge this weekend.  1) She was not able to obtain an echo this admission. Can we get an echo for her before follow-up? 2) She will need to establish with one of our [facility] Cardiologists as a new patient within the next 1-2 weeks. Can you schedule this for her? 3) BMET, CBC, and EKG at time of follow-up.    Thank you! Signed, Arvil Chaco, PA-C 01/22/2021, 11:05 AM Pager [phone]

## 2021-01-23 NOTE — Telephone Encounter (Signed)
Transition Care Management Follow-up Telephone Call  Date of discharge and from where: 01/22/2021, [facility]  How have you been since you were released from the hospital? Patient states that she is doing better. No complaints at this time.   Any questions or concerns? No  Items Reviewed:  Did the pt receive and understand the discharge instructions provided? Yes   Medications obtained and verified? Yes   Other? No   Any new allergies since your discharge? No   Dietary orders reviewed? Yes  Do you have support at home? Yes   Home Care and Equipment/Supplies: Were home health services ordered? not applicable If so, what is the name of the agency? N/A  Has the agency set up a time to come to the patient's home? not applicable Were any new equipment or medical supplies ordered?  No What is the name of the medical supply agency? N/A Were you able to get the supplies/equipment? not applicable Do you have any questions related to the use of the equipment or supplies? No  Functional Questionnaire: (I = Independent and D = Dependent) ADLs: I  Bathing/Dressing- I  Meal Prep- I  Eating- I  Maintaining continence- I  Transferring/Ambulation- I  Managing Meds- I  Follow up appointments reviewed:   PCP Hospital f/u appt confirmed? Yes  Scheduled to see Webb Silversmith, NP on 02/02/2021 @ 12:15 pm.  [facility] f/u appt confirmed? Yes  Scheduled to see cardiology   Are transportation arrangements needed? No   If their condition worsens, is the pt aware to call PCP or go to the Emergency Dept.? Yes  Was the patient provided with contact information for the PCP's office or ED? Yes  Was to pt encouraged to call back with questions or concerns? Yes

## 2021-01-24 NOTE — Telephone Encounter (Signed)
Will discuss at upcoming appt.

## 2021-02-02 ENCOUNTER — Ambulatory Visit: Payer: No Typology Code available for payment source | Admitting: Internal Medicine

## 2021-02-07 ENCOUNTER — Ambulatory Visit (INDEPENDENT_AMBULATORY_CARE_PROVIDER_SITE_OTHER): Payer: No Typology Code available for payment source

## 2021-02-07 ENCOUNTER — Other Ambulatory Visit: Payer: Self-pay

## 2021-02-07 DIAGNOSIS — I4891 Unspecified atrial fibrillation: Secondary | ICD-10-CM | POA: Diagnosis not present

## 2021-02-08 LAB — ECHOCARDIOGRAM COMPLETE
Area-P 1/2: 2.47 cm2
S' Lateral: 2.6 cm

## 2021-02-09 ENCOUNTER — Ambulatory Visit (INDEPENDENT_AMBULATORY_CARE_PROVIDER_SITE_OTHER): Payer: No Typology Code available for payment source | Admitting: Family

## 2021-02-09 ENCOUNTER — Other Ambulatory Visit: Payer: Self-pay

## 2021-02-09 ENCOUNTER — Encounter: Payer: Self-pay | Admitting: Family

## 2021-02-09 ENCOUNTER — Ambulatory Visit (INDEPENDENT_AMBULATORY_CARE_PROVIDER_SITE_OTHER): Payer: No Typology Code available for payment source

## 2021-02-09 VITALS — BP 140/82 | HR 76 | Ht 64.0 in | Wt 191.0 lb

## 2021-02-09 DIAGNOSIS — Z7901 Long term (current) use of anticoagulants: Secondary | ICD-10-CM

## 2021-02-09 DIAGNOSIS — I10 Essential (primary) hypertension: Secondary | ICD-10-CM | POA: Diagnosis not present

## 2021-02-09 DIAGNOSIS — I480 Paroxysmal atrial fibrillation: Secondary | ICD-10-CM

## 2021-02-09 DIAGNOSIS — E876 Hypokalemia: Secondary | ICD-10-CM | POA: Insufficient documentation

## 2021-02-09 MED ORDER — DILTIAZEM HCL ER COATED BEADS 240 MG PO CP24: 240.0000 mg | ORAL_CAPSULE | Freq: Every day | ORAL | 1 refills | Status: DC

## 2021-02-09 MED ORDER — APIXABAN 5 MG PO TABS
5.0000 mg | ORAL_TABLET | Freq: Two times a day (BID) | ORAL | 11 refills | Status: DC
Start: 2021-02-09 — End: 2022-03-13

## 2021-02-09 NOTE — Progress Notes (Signed)
Office Visit    Patient Name: Wanda Aguilar Date of Encounter: 02/09/2021  PCP:  Jearld Fenton, NP   [place]  Cardiologist: New to [facility]. Consult in hospital by Dr. Oval Linsey. To establish with  Kate Sable, MD in [place] office.  Advanced Practice Provider:  No care team member to display Electrophysiologist:  None   Chief Complaint    Wanda Aguilar is a 55 y.o. female with a hx of paroxysmal atrial fibrillation, COVID19 12/2020, hypertension, depression, hypokalemia presents today for hospital follow up.   Past Medical History    Past Medical History:  Diagnosis Date  . Fibroids   . Frequent headaches   . Hypertension   . Urine incontinence    Past Surgical History:  Procedure Laterality Date  . ABDOMINAL HYSTERECTOMY Bilateral 04/13/2014   Procedure: HYSTERECTOMY ABDOMINAL WITH BILATERAL SALPINGO OOPHORECTOMY;  Surgeon: Darlyn Chamber, MD;  Location: [facility];  Service: Gynecology;  Laterality: Bilateral;  . CARPAL TUNNEL RELEASE     Right Hand  . Ceaserean    . CESAREAN SECTION    . GANGLION CYST EXCISION     Bilateral  . TOOTH EXTRACTION      Allergies  No Known Allergies  History of Present Illness    Wanda Aguilar is a 55 y.o. female with a hx of paroxysmal atrial fibrillation, COVID19 12/2020, hypertension, depression, hypokalemia last seen while hospitalized.  Presented to [facility] ED 01/21/21 with chest pain and palpitations found to be in atrial fib with RVR. She was treated with Cardizem and Metoprolol. Anticoagulation with Eliquis initiated due to CHADS2VASc of at least 2 (gender, HTN). She was noted to be COVID positive but no respiratory distress nor infiltrates on chest xray.  For management of blood pressure Amlodipine was discontinued, Cardizem added, Losartan reduced, and HCTZ continued. She did require potassium supplementation.   Echo performed after dischargeo 02/07/21 while admitted with LVEF 60-65%, gr1DD, RV  normal size and function, mild MR.  Presents today for follow up. Echocardiogram reviewed in detail. Reports occasional "flutter" and shortness of breath. Reports no chest pain, pressure, or tightness. No edema, orthopnea, PND. Long discussion regarding atrial fibrillation, stroke risk, triggers of atrial fibrillation.   EKGs/Labs/Other Studies Reviewed:   The following studies were reviewed today:  Echo 02/07/21 1. Left ventricular ejection fraction, by estimation, is 60 to 65%. The  left ventricle has normal function. The left ventricle has no regional  wall motion abnormalities. Left ventricular diastolic parameters are  consistent with Grade I diastolic  dysfunction (impaired relaxation).   2. Right ventricular systolic function is normal. The right ventricular  size is normal.   3. The mitral valve is normal in structure. Mild mitral valve  regurgitation. No evidence of mitral stenosis.   4. The aortic valve was not well visualized. Aortic valve regurgitation  is not visualized. No aortic stenosis is present.   5. The inferior vena cava is normal in size with <50% respiratory  variability, suggesting right atrial pressure of 8 mmHg.   EKG:  EKG is  ordered today.  The ekg ordered today demonstrates NSR 76 bpm with single PVC. No acute St/T wave changes.   Recent Labs: 01/22/2021: ALT 54; BUN 15; Creatinine, Ser 0.68; Hemoglobin 12.1; Magnesium 2.3; Platelets 227; Potassium 3.6; Sodium 139; TSH 0.849  Recent Lipid Panel    Component Value Date/Time   CHOL 199 05/06/2020 1520   TRIG 156 (H) 05/06/2020 1520   HDL 46 (  L) 05/06/2020 1520   CHOLHDL 4.3 05/06/2020 1520   VLDL 57.6 (H) 06/22/2015 0746   LDLCALC 126 (H) 05/06/2020 1520   LDLDIRECT 96.0 06/22/2015 0746   Risk Assessment/Calculations:   This indicates a 2.2% annual risk of stroke. The patient's score is based upon: CHF History: No HTN History: Yes Diabetes History: No Stroke History: No Vascular Disease History:  No Age Score: 0 Gender Score: 1     Home Medications   Current Meds  Medication Sig  . albuterol (VENTOLIN HFA) 108 (90 Base) MCG/ACT inhaler Inhale 2 puffs into the lungs every 4 (four) hours as needed for wheezing or shortness of breath.  . hydrochlorothiazide (MICROZIDE) 12.5 MG capsule TAKE 1 CAPSULE BY MOUTH EVERY DAY  . Ibuprofen-Famotidine 800-26.6 MG TABS Take 1 tablet by mouth daily as needed.  [name] losartan (COZAAR) 100 MG tablet TAKE 1 TABLET BY MOUTH EVERY DAY  . potassium chloride SA (KLOR-CON) 20 MEQ tablet Take 1 tablet (20 mEq total) by mouth daily.  . [DISCONTINUED] apixaban (ELIQUIS) 5 MG TABS tablet Take 1 tablet (5 mg total) by mouth 2 (two) times daily.  . [DISCONTINUED] diltiazem (CARDIZEM CD) 240 MG 24 hr capsule Take 1 capsule (240 mg total) by mouth daily.    Review of Systems  All other systems reviewed and are otherwise negative except as noted above.  Physical Exam    VS:  BP 140/82 (BP Location: Left Arm, Patient Position: Sitting, Cuff Size: Normal)   Pulse 76   Ht 5\' 4"  (1.626 m)   Wt 191 lb (86.6 kg)   LMP 03/22/2014   BMI 32.79 kg/m  , BMI Body mass index is 32.79 kg/m.  Wt Readings from Last 3 Encounters:  02/09/21 191 lb (86.6 kg)  01/22/21 187 lb 1.6 oz (84.9 kg)  05/06/20 188 lb (85.3 kg)    GEN: Well nourished, well developed, in no acute distress. HEENT: normal. Neck: Supple, no JVD, carotid bruits, or masses. Cardiac: RRR, no murmurs, rubs, or gallops. No clubbing, cyanosis, edema.  Radials/DP/PT 2+ and equal bilaterally.  Respiratory:  Respirations regular and unlabored, clear to auscultation bilaterally. GI: Soft, nontender, nondistended. MS: No deformity or atrophy. Skin: Warm and dry, no rash. Neuro:  Strength and sensation are intact.  Psych: Normal affect. Anxious.  Assessment & Plan    1. Atrial fibrillation on chronic anticoagulation - New diagnosis during recent hospitalization with COVID 19. EKG today shows she is  maintaining NSR with isolated PVC. Reports occasional flutter. Will place 14 day ZIO-XT to assess for recurrent atrial fibrillation. Continue Diltizem 240mg  daily, refill provided.  Continue Eliquis 5mg  BID, refill provided. Denies bleeding complications. CBC today. Provided Eliquis copay card. CHA2DS2-VASc Score = 2 [CHF History: No, HTN History: Yes, Diabetes History: No, Stroke History: No, Vascular Disease History: No, Age Score: 0, Gender Score: 1].  Therefore, the patient's annual risk of stroke is 2.2 %.     2. Hypokalemia - She has not been taking potassium due to difficulty with pill size. HCTZ likely cause of hypokalemia. BMP for monitoring. If potassium normal, defer repletion. If potassium low, plan to resume potassium supplementation but with 66mEq tablet size for easier medication adherence.  3. Mild MR / Grade 1 diastolic dysfunction - noted on echo 02/07/21 with normal LVEF. Likely etiology longstanding history of hypertension. Continue optimal BP control, as below. Periodic echocardiogram for monitoring as needed.  4. COVID19 - Recuperating well. Still reports mild shortness of breath. CXR while admitted with no  infiltrates. Lung sounds clear on exam. Encouraged deep breathing and reassurance provided.  5. Hypertension - BP mildly elevated though she is notably anxious in clinic today. Encouraged to bring home cuff to next appointment to check accuracy. If BP remains elevated, consider increased dose of Diltiazem if HR will permit.  Disposition: Follow up in 6 week(s) with Dr. Garen Lah or APP   Signed, Loel Dubonnet, NP 02/09/2021, 9:53 AM [place]

## 2021-02-09 NOTE — Patient Instructions (Addendum)
Medication Instructions:  Continue your current medications.   *If you need a refill on your cardiac medications before your next appointment, please call your pharmacy*  Lab Work: Your provider recommends lab work today: CBC, BMP  If you have labs (blood work) drawn today and your tests are completely normal, you will receive your results only by: [name] MyChart Message (if you have MyChart) OR . A paper copy in the mail If you have any lab test that is abnormal or we need to change your treatment, we will call you to review the results.  Testing/Procedures: Your EKG today showed normal sinus rhythm with an occasional PVC or early beat in the bottom chamber of your heart.   Your physician has recommended that you wear a Zio XT monitor for 14 days. This monitor is a medical device that records the heart's electrical activity. Doctors most often use these monitors to diagnose arrhythmias. Arrhythmias are problems with the speed or rhythm of the heartbeat. The monitor is a small device applied to your chest. You can wear one while you do your normal daily activities. While wearing this monitor if you have any symptoms to push the button and record what you felt. Once you have worn this monitor for the period of time provider prescribed (Usually 14 days), you will return the monitor device in the postage paid box. Once it is returned they will download the data collected and provide [place] with a report which the provider will then review and we will call you with those results. Important tips:  1. Avoid showering during the first 24 hours of wearing the monitor. 2. Avoid excessive sweating to help maximize wear time. 3. Do not submerge the device, no hot tubs, and no swimming pools. 4. Keep any lotions or oils away from the patch. 5. After 24 hours you may shower with the patch on. Take brief showers with your back facing the shower head.  6. Do not remove patch once it has been placed because that will  interrupt data and decrease adhesive wear time. 7. Push the button when you have any symptoms and write down what you were feeling. 8. Once you have completed wearing your monitor, remove and place into box which has postage paid and place in your outgoing mailbox.  9. If for some reason you have misplaced your box then call our office and we can provide another box and/or mail it off for you.       Follow-Up: At [facility], you and your health needs are our priority.  As part of our continuing mission to provide you with exceptional heart care, we have created designated Provider Care Teams.  These Care Teams include your primary Cardiologist (physician) and Advanced Practice Providers (APPs -  Physician Assistants and Nurse Practitioners) who all work together to provide you with the care you need, when you need it.  We recommend signing up for the patient portal called "MyChart".  Sign up information is provided on this After Visit Summary.  MyChart is used to connect with patients for Virtual Visits (Telemedicine).  Patients are able to view lab/test results, encounter notes, upcoming appointments, etc.  Non-urgent messages can be sent to your provider as well.   To learn more about what you can do with MyChart, go to [email or web address].    Your next appointment:   6 week(s)  The format for your next appointment:   In Person  Provider:   You may see No primary  care provider on file. or one of the following Advanced Practice Providers on your designated Care Team:    Murray Hodgkins, NP  Christell Faith, PA-C  Marrianne Mood, PA-C  Cadence [place], [place]  Laurann Montana, NP    Other Instructions  Jodelle Red for monitoring of EKGs at home: You may purchase this if you wish or purchase a Smart Watch with EKG capabilities. These are both optional.  You can look into the [facility] device by [employer].This device is purchased by you and it connects to an application you  download to your smart phone.  It can detect abnormal heart rhythms and alert you to contact your doctor for further evaluation. The device is approximately $90 and the phone application is free.  The web site is:  https://www.alivecor.com   Atrial Fibrillation  Atrial fibrillation is a type of heartbeat that is irregular or fast. If you have this condition, your heart beats without any order. This makes it hard for your heart to pump blood in a normal way. Atrial fibrillation may come and go, or it may become a long-lasting problem. If this condition is not treated, it can put you at higher risk for stroke, heart failure, and other heart problems. What are the causes? This condition may be caused by diseases that damage the heart. They include:  High blood pressure.  Heart failure.  Heart valve disease.  Heart surgery. Other causes include:  Diabetes.  Thyroid disease.  Being overweight.  Kidney disease. Sometimes the cause is not known. What increases the risk? You are more likely to develop this condition if:  You are older.  You smoke.  You exercise often and very hard.  You have a family history of this condition.  You are a man.  You use drugs.  You drink a lot of alcohol.  You have lung conditions, such as emphysema, pneumonia, or COPD.  You have sleep apnea. What are the signs or symptoms? Common symptoms of this condition include:  A feeling that your heart is beating very fast.  Chest pain or discomfort.  Feeling short of breath.  Suddenly feeling light-headed or weak.  Getting tired easily during activity.  Fainting.  Sweating. In some cases, there are no symptoms. How is this treated? Treatment for this condition depends on underlying conditions and how you feel when you have atrial fibrillation. They include:  Medicines to: ? Prevent blood clots. ? Treat heart rate or heart rhythm problems.  Using devices, such as a pacemaker, to  correct heart rhythm problems.  Doing surgery to remove the part of the heart that sends bad signals.  Closing an area where clots can form in the heart (left atrial appendage). In some cases, your doctor will treat other underlying conditions. Follow these instructions at home: Medicines  Take over-the-counter and prescription medicines only as told by your doctor.  Do not take any new medicines without first talking to your doctor.  If you are taking blood thinners: ? Talk with your doctor before you take any medicines that have aspirin or NSAIDs, such as ibuprofen, in them. ? Take your medicine exactly as told by your doctor. Take it at the same time each day. ? Avoid activities that could hurt or bruise you. Follow instructions about how to prevent falls. ? Wear a bracelet that says you are taking blood thinners. Or, carry a card that lists what medicines you take. Lifestyle  Do not use any products that have nicotine or tobacco in  them. These include cigarettes, e-cigarettes, and chewing tobacco. If you need help quitting, ask your doctor.  Eat heart-healthy foods. Talk with your doctor about the right eating plan for you.  Exercise regularly as told by your doctor.  Do not drink alcohol.  Lose weight if you are overweight.  Do not use drugs, including cannabis.      General instructions  If you have a condition that causes breathing to stop for a short period of time (apnea), treat it as told by your doctor.  Keep a healthy weight. Do not use diet pills unless your doctor says they are safe for you. Diet pills may make heart problems worse.  Keep all follow-up visits as told by your doctor. This is important. Contact a doctor if:  You notice a change in the speed, rhythm, or strength of your heartbeat.  You are taking a blood-thinning medicine and you get more bruising.  You get tired more easily when you move or exercise.  You have a sudden change in  weight. Get help right away if:  You have pain in your chest or your belly (abdomen).  You have trouble breathing.  You have side effects of blood thinners, such as blood in your vomit, poop (stool), or pee (urine), or bleeding that cannot stop.  You have any signs of a stroke. "BE FAST" is an easy way to remember the main warning signs: ? B - Balance. Signs are dizziness, sudden trouble walking, or loss of balance. ? E - Eyes. Signs are trouble seeing or a change in how you see. ? F - Face. Signs are sudden weakness or loss of feeling in the face, or the face or eyelid drooping on one side. ? A - Arms. Signs are weakness or loss of feeling in an arm. This happens suddenly and usually on one side of the body. ? S - Speech. Signs are sudden trouble speaking, slurred speech, or trouble understanding what people say. ? T - Time. Time to call emergency services. Write down what time symptoms started.  You have other signs of a stroke, such as: ? A sudden, very bad headache with no known cause. ? Feeling like you may vomit (nausea). ? Vomiting. ? A seizure. These symptoms may be an emergency. Do not wait to see if the symptoms will go away. Get medical help right away. Call your local emergency services (911 in the [place] Do not drive yourself to the hospital.   Summary  Atrial fibrillation is a type of heartbeat that is irregular or fast.  You are at higher risk of this condition if you smoke, are older, have diabetes, or are overweight.  Follow your doctor's instructions about medicines, diet, exercise, and follow-up visits.  Get help right away if you have signs or symptoms of a stroke.  Get help right away if you cannot catch your breath, or you have chest pain or discomfort. This information is not intended to replace advice given to you by your health care provider. Make sure you discuss any questions you have with your health care provider. Document Revised: 06/03/2019 Document  Reviewed: 06/03/2019 Elsevier Patient Education  [employer].

## 2021-02-10 LAB — BASIC METABOLIC PANEL
BUN/Creatinine Ratio: 24 — ABNORMAL HIGH (ref 9–23)
BUN: 15 mg/dL (ref 6–24)
CO2: 22 mmol/L (ref 20–29)
Calcium: 9.7 mg/dL (ref 8.7–10.2)
Chloride: 102 mmol/L (ref 96–106)
Creatinine, Ser: 0.63 mg/dL (ref 0.57–1.00)
GFR calc Af Amer: 118 mL/min/{1.73_m2} (ref >59)
GFR calc non Af Amer: 102 mL/min/{1.73_m2} (ref >59)
Glucose: 164 mg/dL — ABNORMAL HIGH (ref 65–99)
Potassium: 4.1 mmol/L (ref 3.5–5.2)
Sodium: 141 mmol/L (ref 134–144)

## 2021-02-10 LAB — CBC
Hematocrit: 42 % (ref 34.0–46.6)
Hemoglobin: 14.7 g/dL (ref 11.1–15.9)
MCH: 32 pg (ref 26.6–33.0)
MCHC: 35 g/dL (ref 31.5–35.7)
MCV: 92 fL (ref 79–97)
Platelets: 275 10*3/uL (ref 150–450)
RBC: 4.59 x10E6/uL (ref 3.77–5.28)
RDW: 11.9 % (ref 11.7–15.4)
WBC: 7.4 10*3/uL (ref 3.4–10.8)

## 2021-02-22 DIAGNOSIS — I480 Paroxysmal atrial fibrillation: Secondary | ICD-10-CM

## 2021-02-23 ENCOUNTER — Inpatient Hospital Stay: Payer: No Typology Code available for payment source | Admitting: Internal Medicine

## 2021-03-08 ENCOUNTER — Telehealth: Payer: Self-pay

## 2021-03-08 NOTE — Telephone Encounter (Signed)
Called to review the patients monitor results and Caitlin walker, NP recommendation. [email or web address].

## 2021-03-08 NOTE — Telephone Encounter (Signed)
-----   Message from Loel Dubonnet, NP sent at 03/08/2021  8:57 AM EDT ----- Monitor showed no recurrent atrial fibrillation which is a great result! Predominantly normal sinus rhyth. Isolated episode of very short fast heart rate in bottom chambers of the heart. She did have 7 episodes of a fast heart beat called SVT which is likely the palpitations she was describing during clinic visit. Recommend increased Diltiazem to 300mg  daily to prevent them from happening as often.

## 2021-03-13 MED ORDER — DILTIAZEM HCL ER BEADS 300 MG PO CP24: 300.0000 mg | ORAL_CAPSULE | Freq: Every day | ORAL | 6 refills | Status: DC

## 2021-03-13 NOTE — Telephone Encounter (Signed)
I spoke with the patient regarding her heart monitor results and Laurann Montana, NP's recommendations to increase diltiazem to 300 mg once daily.  The patient voices understanding of these results and is agreeable with increasing her diltiazem to 300 mg once daily.  RX sent to [facility].  The patient is aware of her follow up appointment on 4/1 with Urban Gibson, NP at 3:30 pm

## 2021-03-13 NOTE — Telephone Encounter (Signed)
Attempted to call the patient. No answer- I left a message to please call back.  

## 2021-03-14 ENCOUNTER — Inpatient Hospital Stay: Payer: No Typology Code available for payment source | Admitting: Internal Medicine

## 2021-03-24 ENCOUNTER — Ambulatory Visit: Payer: No Typology Code available for payment source | Admitting: Family

## 2021-03-28 ENCOUNTER — Other Ambulatory Visit: Payer: Self-pay | Admitting: Internal Medicine

## 2021-03-28 DIAGNOSIS — I10 Essential (primary) hypertension: Secondary | ICD-10-CM

## 2021-03-28 DIAGNOSIS — Z0000 Encounter for general adult medical examination without abnormal findings: Secondary | ICD-10-CM

## 2021-04-11 ENCOUNTER — Ambulatory Visit: Payer: No Typology Code available for payment source | Admitting: Family

## 2021-04-11 NOTE — Progress Notes (Deleted)
Office Visit    Patient Name: Wanda Aguilar Date of Encounter: 04/11/2021  PCP:  Jearld Fenton, NP   [place]  Cardiologist: New to [facility]. Consult in hospital by Dr. Oval Linsey. To establish with  Kate Sable, MD in [place] office.  Advanced Practice Provider:  No care team member to display Electrophysiologist:  None   Chief Complaint    Wanda Aguilar is a 55 y.o. female with a hx of paroxysmal atrial fibrillation, COVID19 12/2020, hypertension, depression, hypokalemia presents today for hospital follow up.   Past Medical History    Past Medical History:  Diagnosis Date  . Fibroids   . Frequent headaches   . Hypertension   . Urine incontinence    Past Surgical History:  Procedure Laterality Date  . ABDOMINAL HYSTERECTOMY Bilateral 04/13/2014   Procedure: HYSTERECTOMY ABDOMINAL WITH BILATERAL SALPINGO OOPHORECTOMY;  Surgeon: Darlyn Chamber, MD;  Location: [facility];  Service: Gynecology;  Laterality: Bilateral;  . CARPAL TUNNEL RELEASE     Right Hand  . Ceaserean    . CESAREAN SECTION    . GANGLION CYST EXCISION     Bilateral  . TOOTH EXTRACTION      Allergies  No Known Allergies  History of Present Illness    Wanda Aguilar is a 55 y.o. female with a hx of paroxysmal atrial fibrillation, COVID19 12/2020, hypertension, depression, hypokalemia last seen 02/09/21.  Presented to [facility] ED 01/21/21 with chest pain and palpitations found to be in atrial fib with RVR. She was treated with Cardizem and Metoprolol. Anticoagulation with Eliquis initiated due to CHADS2VASc of at least 2 (gender, HTN). She was noted to be COVID positive but no respiratory distress nor infiltrates on chest xray.  For management of blood pressure Amlodipine was discontinued, Cardizem added, Losartan reduced, and HCTZ continued. She did require potassium supplementation.   Echo performed after discharge 02/07/21 with LVEF 60-65%, gr1DD, RV normal size and function,  mild MR.  She was seen in follow-up 02/09/2021 noting occasional palpitations and shortness of breath.  Her EKG showed she was maintaining sinus rhythm.  14-day ZIO XT placed.  ZIO monitor showed predominantly NSR with average heart 76 bpm, 1 run of VT lasting 4 beats with max rate of 255 bpm, 7 runs of SVT fastest 4 beats max rate 179 bpm and longest 10 beats with rate 116 bpm.  SVT was detected within 45 seconds of her symptomatic events.  Her diltiazem was increased to 300 mg daily.  She presents today for follow-up. ***  EKGs/Labs/Other Studies Reviewed:   The following studies were reviewed today:  Echo 02/07/21 1. Left ventricular ejection fraction, by estimation, is 60 to 65%. The  left ventricle has normal function. The left ventricle has no regional  wall motion abnormalities. Left ventricular diastolic parameters are  consistent with Grade I diastolic  dysfunction (impaired relaxation).   2. Right ventricular systolic function is normal. The right ventricular  size is normal.   3. The mitral valve is normal in structure. Mild mitral valve  regurgitation. No evidence of mitral stenosis.   4. The aortic valve was not well visualized. Aortic valve regurgitation  is not visualized. No aortic stenosis is present.   5. The inferior vena cava is normal in size with <50% respiratory  variability, suggesting right atrial pressure of 8 mmHg.   EKG:  EKG is  ordered today.  The ekg ordered today demonstrates NSR 76 bpm with single PVC. No  acute St/T wave changes. ***  Recent Labs: 01/22/2021: ALT 54; Magnesium 2.3; TSH 0.849 02/09/2021: BUN 15; Creatinine, Ser 0.63; Hemoglobin 14.7; Platelets 275; Potassium 4.1; Sodium 141  Recent Lipid Panel    Component Value Date/Time   CHOL 199 05/06/2020 1520   TRIG 156 (H) 05/06/2020 1520   HDL 46 (L) 05/06/2020 1520   CHOLHDL 4.3 05/06/2020 1520   VLDL 57.6 (H) 06/22/2015 0746   LDLCALC 126 (H) 05/06/2020 1520   LDLDIRECT 96.0 06/22/2015 0746    Risk Assessment/Calculations:   This indicates a 2.2% annual risk of stroke. The patient's score is based upon: CHF History: No HTN History: Yes Diabetes History: No Stroke History: No Vascular Disease History: No Age Score: 0 Gender Score: 1     Home Medications   No outpatient medications have been marked as taking for the 04/11/21 encounter (Appointment) with Loel Dubonnet, NP.    Review of Systems  All other systems reviewed and are otherwise negative except as noted above.  Physical Exam    VS:  LMP 03/22/2014  , BMI There is no height or weight on file to calculate BMI.  Wt Readings from Last 3 Encounters:  02/09/21 191 lb (86.6 kg)  01/22/21 187 lb 1.6 oz (84.9 kg)  05/06/20 188 lb (85.3 kg)    GEN: Well nourished, well developed, in no acute distress. HEENT: normal. Neck: Supple, no JVD, carotid bruits, or masses. Cardiac: RRR, no murmurs, rubs, or gallops. No clubbing, cyanosis, edema.  Radials/DP/PT 2+ and equal bilaterally.  Respiratory:  Respirations regular and unlabored, clear to auscultation bilaterally. GI: Soft, nontender, nondistended. MS: No deformity or atrophy. Skin: Warm and dry, no rash. Neuro:  Strength and sensation are intact.  Psych: Normal affect. Anxious.  Assessment & Plan    1. Atrial fibrillation on chronic anticoagulation - New diagnosis during recent hospitalization with COVID 19. EKG today shows she is maintaining NSR with isolated PVC. Reports occasional flutter. Will place 14 day ZIO-XT to assess for recurrent atrial fibrillation. Continue Diltizem 240mg  daily, refill provided.  Continue Eliquis 5mg  BID, refill provided. Denies bleeding complications. CBC today. Provided Eliquis copay card. CHA2DS2-VASc Score = 2 [CHF History: No, HTN History: Yes, Diabetes History: No, Stroke History: No, Vascular Disease History: No, Age Score: 0, Gender Score: 1].  Therefore, the patient's annual risk of stroke is 2.2 %.   ***  2. Hypokalemia  - She has not been taking potassium due to difficulty with pill size. HCTZ likely cause of hypokalemia. BMP for monitoring. If potassium normal, defer repletion. If potassium low, plan to resume potassium supplementation but with 83mEq tablet size for easier medication adherence.***  3. Mild MR / Grade 1 diastolic dysfunction - noted on echo 02/07/21 with normal LVEF. Likely etiology longstanding history of hypertension. Continue optimal BP control, as below. Periodic echocardiogram for monitoring as needed.***  4. COVID19 - Recuperating well. Still reports mild shortness of breath. CXR while admitted with no infiltrates. Lung sounds clear on exam. Encouraged deep breathing and reassurance provided.***  5. Hypertension - BP mildly elevated though she is notably anxious in clinic today. Encouraged to bring home cuff to next appointment to check accuracy. If BP remains elevated, consider increased dose of Diltiazem if HR will permit.***  Disposition: Follow up*** in 6 week(s) with Dr. Garen Lah or APP   Signed, Loel Dubonnet, NP 04/11/2021, 1:41 PM [place]

## 2021-05-12 ENCOUNTER — Other Ambulatory Visit: Payer: Self-pay

## 2021-05-12 ENCOUNTER — Ambulatory Visit (INDEPENDENT_AMBULATORY_CARE_PROVIDER_SITE_OTHER): Payer: No Typology Code available for payment source

## 2021-05-12 ENCOUNTER — Encounter: Payer: Self-pay | Admitting: Podiatry

## 2021-05-12 ENCOUNTER — Ambulatory Visit (INDEPENDENT_AMBULATORY_CARE_PROVIDER_SITE_OTHER): Payer: No Typology Code available for payment source | Admitting: Podiatry

## 2021-05-12 DIAGNOSIS — M778 Other enthesopathies, not elsewhere classified: Secondary | ICD-10-CM

## 2021-05-12 DIAGNOSIS — M2042 Other hammer toe(s) (acquired), left foot: Secondary | ICD-10-CM

## 2021-05-12 DIAGNOSIS — M722 Plantar fascial fibromatosis: Secondary | ICD-10-CM

## 2021-05-12 DIAGNOSIS — M21619 Bunion of unspecified foot: Secondary | ICD-10-CM | POA: Diagnosis not present

## 2021-05-12 MED ORDER — TRIAMCINOLONE ACETONIDE 10 MG/ML IJ SUSP
10.0000 mg | Freq: Once | INTRAMUSCULAR | Status: AC
  Administered 2021-05-12: 10 mg

## 2021-05-12 NOTE — Progress Notes (Signed)
Subjective:   Patient ID: Wanda Aguilar, female   DOB: 55 y.o.   MRN: [id number]   HPI Patient has developed a lot of pain underneath the left foot and stated she had movement of the second toe.  The right also hurts but not to the same degree and the left one is worse and she knows she has history of long-term bunions that are not significantly tender but bothersome.  Patient has good digital perfusion smokes periodically   Review of Systems  All other systems reviewed and are negative.       Objective:  Physical Exam Vitals and nursing note reviewed.  Constitutional:      Appearance: She is well-developed.  Pulmonary:     Effort: Pulmonary effort is normal.  Musculoskeletal:        General: Normal range of motion.  Skin:    General: Skin is warm.  Neurological:     Mental Status: She is alert.     Neurovascular status intact muscle strength found to be adequate range of motion within normal limits.  Patient is found to have exquisite discomfort of the second MPJ left with fluid buildup around the joint surface and deviation of the second toe and third toe with a space between them.  Mild discomfort right moderate bunion deformity bilateral     Assessment:  Acute capsulitis of the second MPJ left with inflammation fluid of the joint surface along with probable flexor plate disruption moderate bunion deformity     Plan:  H&P reviewed all conditions today I did a sterile prep I injected and aspirated the second MPJ getting out a small amount of clear fluid and then injected with quarter cc dexamethasone Kenalog apply thick plantar padding to reduce pressure on the joint reappoint and discussed possibility for digital fusion shortening osteotomy  X-rays indicate there is a significant elevation with rigid contracture digit to left over right with changes of the second and third MPJ left

## 2021-05-16 ENCOUNTER — Encounter: Payer: Self-pay | Admitting: Internal Medicine

## 2021-05-16 ENCOUNTER — Ambulatory Visit (INDEPENDENT_AMBULATORY_CARE_PROVIDER_SITE_OTHER): Payer: No Typology Code available for payment source | Admitting: Internal Medicine

## 2021-05-16 ENCOUNTER — Other Ambulatory Visit: Payer: Self-pay

## 2021-05-16 VITALS — BP 157/80 | HR 62 | Temp 97.1°F | Resp 17 | Ht 64.0 in | Wt 194.6 lb

## 2021-05-16 DIAGNOSIS — Z0001 Encounter for general adult medical examination with abnormal findings: Secondary | ICD-10-CM | POA: Diagnosis not present

## 2021-05-16 DIAGNOSIS — E6609 Other obesity due to excess calories: Secondary | ICD-10-CM

## 2021-05-16 DIAGNOSIS — I480 Paroxysmal atrial fibrillation: Secondary | ICD-10-CM

## 2021-05-16 DIAGNOSIS — I10 Essential (primary) hypertension: Secondary | ICD-10-CM | POA: Diagnosis not present

## 2021-05-16 DIAGNOSIS — R519 Headache, unspecified: Secondary | ICD-10-CM

## 2021-05-16 DIAGNOSIS — Z6833 Body mass index (BMI) 33.0-33.9, adult: Secondary | ICD-10-CM

## 2021-05-16 DIAGNOSIS — E66811 Obesity, class 1: Secondary | ICD-10-CM

## 2021-05-16 DIAGNOSIS — F411 Generalized anxiety disorder: Secondary | ICD-10-CM | POA: Diagnosis not present

## 2021-05-16 DIAGNOSIS — F5101 Primary insomnia: Secondary | ICD-10-CM

## 2021-05-16 DIAGNOSIS — E119 Type 2 diabetes mellitus without complications: Secondary | ICD-10-CM

## 2021-05-16 DIAGNOSIS — E876 Hypokalemia: Secondary | ICD-10-CM

## 2021-05-16 DIAGNOSIS — Z1159 Encounter for screening for other viral diseases: Secondary | ICD-10-CM

## 2021-05-16 LAB — POCT GLYCOSYLATED HEMOGLOBIN (HGB A1C): Hemoglobin A1C: 7.3 % — AB (ref 4.0–5.6)

## 2021-05-16 MED ORDER — METFORMIN HCL 500 MG PO TABS: 500.0000 mg | ORAL_TABLET | Freq: Two times a day (BID) | ORAL | 2 refills | Status: DC

## 2021-05-16 NOTE — Progress Notes (Signed)
Subjective:    Patient ID: Wanda Aguilar, female    DOB: 05-16-1966, 55 y.o.   MRN: [id number]  HPI  Patient presents the clinic today for her annual exam.  She is also due to follow-up chronic conditions.  Anxiety: Persistent and uncontrolled.  She is not taking any medication for this at this time.  She is not currently seeing a therapist.  She denies SI/HI.  Frequent Headaches: These occur about 3 times per week.  She is taking Excedrin Migraine as needed with good relief of symptoms.  She does not follow with neurology.  HTN: Her BP today is 152/76.  She is taking Losartan and HCTZ as prescribed.  ECG from 01/2021 reviewed.  Insomnia: She has difficulty falling asleep.  She is not taking anything for this at this time.  There is no sleep study on file.  Afib: Persistent.  Managed on Diltiazem and Eliquis.  ECG from 01/2021 reviewed.  She is following with cardiology.  Prediabetes: Her last A1c was 6%, 03/2020.  She is not taking any oral diabetic medication at this time.  She does not check her sugars.  Chronic Left Wrist Pain: She is taking Diclofenac as prescribed. She follows with Raliegh Ip.  Flu: Never Tetanus: < 10 years COVID: Never Pap smear: 09/2018 Mammogram: 2021, [facility] Colon screening: 12/2018 Vision screening: As needed Dentist: Annually  Diet: She does eat meat. She consumes fruits and veggies. She tries to avoid fried foods. She drinks mostly water, Sprite Zero Exercise: None  Review of Systems      Past Medical History:  Diagnosis Date  . Fibroids   . Frequent headaches   . Hypertension   . Urine incontinence     Current Outpatient Medications  Medication Sig Dispense Refill  . hydrochlorothiazide (MICROZIDE) 12.5 MG capsule TAKE 1 CAPSULE BY MOUTH EVERY DAY 90 capsule 0  . losartan (COZAAR) 100 MG tablet TAKE 1 TABLET BY MOUTH EVERY DAY 90 tablet 0  . albuterol (VENTOLIN HFA) 108 (90 Base) MCG/ACT inhaler Inhale 2 puffs into the  lungs every 4 (four) hours as needed for wheezing or shortness of breath. 1 each 0  . apixaban (ELIQUIS) 5 MG TABS tablet Take 1 tablet (5 mg total) by mouth 2 (two) times daily. 60 tablet 11  . diltiazem (TIAZAC) 300 MG 24 hr capsule Take 1 capsule (300 mg total) by mouth daily. 30 capsule 6  . Ibuprofen-Famotidine 800-26.6 MG TABS Take 1 tablet by mouth daily as needed. 30 tablet 2  . potassium chloride SA (KLOR-CON) 20 MEQ tablet Take 1 tablet (20 mEq total) by mouth daily. 30 tablet 0   No current facility-administered medications for this visit.    No Known Allergies  Family History  Problem Relation Age of Onset  . Diabetes Father   . Diabetes Paternal Aunt   . Diabetes Paternal Uncle   . Arthritis Maternal Grandmother   . Alcohol abuse Maternal Grandfather   . Diabetes Paternal Grandmother   . Diabetes Paternal Grandfather     Social History   Socioeconomic History  . Marital status: Divorced    Spouse name: Not on file  . Number of children: Not on file  . Years of education: Not on file  . Highest education level: Not on file  Occupational History  . Not on file  Tobacco Use  . Smoking status: Current Some Day Smoker    Packs/day: 0.30    Years: 4.00    Pack  years: 1.20    Types: Cigarettes    Start date: 01/14/2009  . Smokeless tobacco: Never Used  Substance and Sexual Activity  . Alcohol use: No  . Drug use: No  . Sexual activity: Yes  Other Topics Concern  . Not on file  Social History Narrative  . Not on file   Social Determinants of Health   Financial Resource Strain: Not on file  Food Insecurity: Not on file  Transportation Needs: Not on file  Physical Activity: Not on file  Stress: Not on file  Social Connections: Not on file  Intimate Partner Violence: Not on file     Constitutional: Patient reports frequent headaches.  Denies fever, malaise, fatigue, or abrupt weight changes.  HEENT: Denies eye pain, eye redness, ear pain, ringing in the  ears, wax buildup, runny nose, nasal congestion, bloody nose, or sore throat. Respiratory: Denies difficulty breathing, shortness of breath, cough or sputum production.   Cardiovascular: Pt reports swelling in her hands and feet. Denies chest pain, chest tightness, palpitations.  Gastrointestinal: Pt reports intermittent constipation. Denies abdominal pain, bloating, diarrhea or blood in the stool.  GU: Denies urgency, frequency, pain with urination, burning sensation, blood in urine, odor or discharge. Musculoskeletal: Pt reports chronic left wrist pain. Denies decrease in range of motion, difficulty with gait, muscle pain or joint swelling.  Skin: Denies redness, rashes, lesions or ulcercations.  Neurological: Patient reports insomnia.  Denies dizziness, difficulty with memory, difficulty with speech or problems with balance and coordination.  Psych: Patient has a history of anxiety.  Denies depression, SI/HI.  No other specific complaints in a complete review of systems (except as listed in HPI above).  Objective:   Physical Exam  BP (!) 157/80   Pulse 62   Temp (!) 97.1 F (36.2 C) (Temporal)   Resp 17   Ht _0  (1.626 m)   Wt 194 lb 9.6 oz (88.3 kg)   LMP 03/22/2014   SpO2 99%   BMI 33.40 kg/m   Wt Readings from Last 3 Encounters:  02/09/21 191 lb (86.6 kg)  01/22/21 187 lb 1.6 oz (84.9 kg)  05/06/20 188 lb (85.3 kg)    General: Appears her stated age, obese in NAD. Skin: Warm, dry and intact. No rashes noted. HEENT: Head: normal shape and size; Eyes: sclera white and EOMs intact; Neck:  Neck supple, trachea midline. No masses, lumps or thyromegaly present.  Cardiovascular: Normal rate and rhythm. S1,S2 noted.  No murmur, rubs or gallops noted.Trace pitting BLE edema. No carotid bruits noted. Pulmonary/Chest: Normal effort and positive vesicular breath sounds. No respiratory distress. No wheezes, rales or ronchi noted.  Abdomen: Soft and nontender. Normal bowel sounds. No  distention or masses noted. Liver, spleen and kidneys non palpable. Musculoskeletal: Strength 5/5 BUE/BLE. No difficulty with gait.  Neurological: Alert and oriented. Cranial nerves II-XII grossly intact. Coordination normal.  Psychiatric: Tearful and anxious appearing. Racing thoughts noted.    BMET    Component Value Date/Time   NA 141 02/09/2021 0917   K 4.1 02/09/2021 0917   CL 102 02/09/2021 0917   CO2 22 02/09/2021 0917   GLUCOSE 164 (H) 02/09/2021 0917   GLUCOSE 135 (H) 01/22/2021 0339   BUN 15 02/09/2021 0917   CREATININE 0.63 02/09/2021 0917   CREATININE 0.73 05/06/2020 1520   CALCIUM 9.7 02/09/2021 0917   GFRNONAA 102 02/09/2021 0917   GFRNONAA >60 01/22/2021 0339   GFRAA 118 02/09/2021 0917    Lipid Panel  Component Value Date/Time   CHOL 199 05/06/2020 1520   TRIG 156 (H) 05/06/2020 1520   HDL 46 (L) 05/06/2020 1520   CHOLHDL 4.3 05/06/2020 1520   VLDL 57.6 (H) 06/22/2015 0746   LDLCALC 126 (H) 05/06/2020 1520    CBC    Component Value Date/Time   WBC 7.4 02/09/2021 0917   WBC 8.8 01/22/2021 0339   RBC 4.59 02/09/2021 0917   RBC 3.85 (L) 01/22/2021 0339   HGB 14.7 02/09/2021 0917   HCT 42.0 02/09/2021 0917   PLT 275 02/09/2021 0917   MCV 92 02/09/2021 0917   MCH 32.0 02/09/2021 0917   MCH 31.4 01/22/2021 0339   MCHC 35.0 02/09/2021 0917   MCHC 33.2 01/22/2021 0339   RDW 11.9 02/09/2021 0917   LYMPHSABS 2.1 01/22/2021 0339   MONOABS 0.6 01/22/2021 0339   EOSABS 0.1 01/22/2021 0339   BASOSABS 0.1 01/22/2021 0339    Hgb A1C Lab Results  Component Value Date   HGBA1C 6.0 (H) 05/06/2020           Assessment & Plan:  Preventative Health Maintenance:  Encouraged her to get a flu shot in fall She declines tetanus shot today Encouraged her to get her COVID vaccine Pap smear UTD Mammogram UTD, will request copy Colon screening UTD Encouraged her to consume a balanced diet exercise regimen Advised her to see an eye doctor and dentist  annually  We will check CBC, c-Met, TSH, lipid, A1c and hep C today  RTC in 3 months, follow-up chronic conditions Webb Silversmith, NP This visit occurred during the SARS-CoV-2 public health emergency.  Safety protocols were in place, including screening questions prior to the visit, additional usage of staff PPE, and extensive cleaning of exam room while observing appropriate contact time as indicated for disinfecting solutions.

## 2021-05-16 NOTE — Patient Instructions (Signed)

## 2021-05-17 ENCOUNTER — Encounter: Payer: Self-pay | Admitting: Internal Medicine

## 2021-05-17 ENCOUNTER — Other Ambulatory Visit: Payer: Self-pay

## 2021-05-17 DIAGNOSIS — E119 Type 2 diabetes mellitus without complications: Secondary | ICD-10-CM | POA: Insufficient documentation

## 2021-05-17 NOTE — Assessment & Plan Note (Signed)
Not medicated Will monitor 

## 2021-05-17 NOTE — Assessment & Plan Note (Signed)
Not taking Potassium CMET today

## 2021-05-17 NOTE — Assessment & Plan Note (Signed)
POCT A1C 7.4%, new onset Discussed diabetes and standards of medical care She declines referral to nutrition at this time Encouraged low carb diet and exercise for weight loss RX for Metformin 500 mg BID Encouraged routine eye exams Encouraged routine foot exams

## 2021-05-17 NOTE — Assessment & Plan Note (Signed)
Uncontrolled Anxiety likely a contributing factor Reinforced DASH diet and exercise for weight loss Consider changing HCTZ to Furosemide, pending labs Continue Lisinopril for now Will monitor

## 2021-05-17 NOTE — Assessment & Plan Note (Signed)
Continue Excedrin Migraine as needed Stress likely a contributing factor, encouraged stress reducing techniques

## 2021-05-17 NOTE — Assessment & Plan Note (Signed)
Continue Metoprolol and Eliquis She will continue to see cardiology, will follow CBC and CMET today

## 2021-05-17 NOTE — Assessment & Plan Note (Signed)
Uncontrolled Consider SSRI therapy pending labs Support offered

## 2021-05-18 LAB — LIPID PANEL
Cholesterol: 227 mg/dL — ABNORMAL HIGH (ref <200)
HDL: 44 mg/dL — ABNORMAL LOW (ref >=50)
LDL Cholesterol (Calc): 139 mg/dL (calc) — ABNORMAL HIGH
Non-HDL Cholesterol (Calc): 183 mg/dL (calc) — ABNORMAL HIGH (ref <130)
Total CHOL/HDL Ratio: 5.2 (calc) — ABNORMAL HIGH (ref <5.0)
Triglycerides: 310 mg/dL — ABNORMAL HIGH (ref <150)

## 2021-05-18 LAB — CBC
HCT: 41.6 % (ref 35.0–45.0)
Hemoglobin: 13.9 g/dL (ref 11.7–15.5)
MCH: 30.9 pg (ref 27.0–33.0)
MCHC: 33.4 g/dL (ref 32.0–36.0)
MCV: 92.4 fL (ref 80.0–100.0)
MPV: 10.4 fL (ref 7.5–12.5)
Platelets: 281 10*3/uL (ref 140–400)
RBC: 4.5 10*6/uL (ref 3.80–5.10)
RDW: 12 % (ref 11.0–15.0)
WBC: 6.3 10*3/uL (ref 3.8–10.8)

## 2021-05-18 LAB — COMPREHENSIVE METABOLIC PANEL
AG Ratio: 2.1 (calc) (ref 1.0–2.5)
ALT: 29 U/L (ref 6–29)
AST: 17 U/L (ref 10–35)
Albumin: 4.9 g/dL (ref 3.6–5.1)
Alkaline phosphatase (APISO): 82 U/L (ref 37–153)
BUN: 20 mg/dL (ref 7–25)
CO2: 27 mmol/L (ref 20–32)
Calcium: 9.5 mg/dL (ref 8.6–10.4)
Chloride: 103 mmol/L (ref 98–110)
Creat: 0.65 mg/dL (ref 0.50–1.05)
Globulin: 2.3 g/dL (calc) (ref 1.9–3.7)
Glucose, Bld: 142 mg/dL — ABNORMAL HIGH (ref 65–99)
Potassium: 4.7 mmol/L (ref 3.5–5.3)
Sodium: 138 mmol/L (ref 135–146)
Total Bilirubin: 0.6 mg/dL (ref 0.2–1.2)
Total Protein: 7.2 g/dL (ref 6.1–8.1)

## 2021-05-18 LAB — HEPATITIS C ANTIBODY
Hepatitis C Ab: NONREACTIVE
SIGNAL TO CUT-OFF: 0 (ref <1.00)

## 2021-05-18 LAB — TSH: TSH: 0.84 mIU/L

## 2021-05-24 ENCOUNTER — Inpatient Hospital Stay
Admission: EM | Admit: 2021-05-24 | Discharge: 2021-05-24 | DRG: 310 | Disposition: A | Discharge status: Home / Self Care | Payer: No Typology Code available for payment source | Attending: Internal Medicine | Admitting: Internal Medicine

## 2021-05-24 ENCOUNTER — Other Ambulatory Visit: Payer: Self-pay

## 2021-05-24 ENCOUNTER — Emergency Department: Payer: No Typology Code available for payment source

## 2021-05-24 ENCOUNTER — Encounter: Payer: Self-pay | Admitting: Emergency Medicine

## 2021-05-24 DIAGNOSIS — E119 Type 2 diabetes mellitus without complications: Secondary | ICD-10-CM

## 2021-05-24 DIAGNOSIS — F17210 Nicotine dependence, cigarettes, uncomplicated: Secondary | ICD-10-CM | POA: Diagnosis present

## 2021-05-24 DIAGNOSIS — Z720 Tobacco use: Secondary | ICD-10-CM | POA: Diagnosis not present

## 2021-05-24 DIAGNOSIS — I10 Essential (primary) hypertension: Secondary | ICD-10-CM | POA: Diagnosis present

## 2021-05-24 DIAGNOSIS — Z79899 Other long term (current) drug therapy: Secondary | ICD-10-CM

## 2021-05-24 DIAGNOSIS — R32 Unspecified urinary incontinence: Secondary | ICD-10-CM | POA: Diagnosis present

## 2021-05-24 DIAGNOSIS — Z20822 Contact with and (suspected) exposure to covid-19: Secondary | ICD-10-CM | POA: Diagnosis present

## 2021-05-24 DIAGNOSIS — Z90710 Acquired absence of both cervix and uterus: Secondary | ICD-10-CM | POA: Diagnosis not present

## 2021-05-24 DIAGNOSIS — R079 Chest pain, unspecified: Secondary | ICD-10-CM | POA: Diagnosis present

## 2021-05-24 DIAGNOSIS — Z8261 Family history of arthritis: Secondary | ICD-10-CM

## 2021-05-24 DIAGNOSIS — E785 Hyperlipidemia, unspecified: Secondary | ICD-10-CM | POA: Diagnosis present

## 2021-05-24 DIAGNOSIS — E1169 Type 2 diabetes mellitus with other specified complication: Secondary | ICD-10-CM | POA: Diagnosis present

## 2021-05-24 DIAGNOSIS — Z7984 Long term (current) use of oral hypoglycemic drugs: Secondary | ICD-10-CM

## 2021-05-24 DIAGNOSIS — Z833 Family history of diabetes mellitus: Secondary | ICD-10-CM | POA: Diagnosis not present

## 2021-05-24 DIAGNOSIS — I4891 Unspecified atrial fibrillation: Secondary | ICD-10-CM | POA: Diagnosis not present

## 2021-05-24 DIAGNOSIS — I480 Paroxysmal atrial fibrillation: Secondary | ICD-10-CM | POA: Diagnosis present

## 2021-05-24 DIAGNOSIS — Z7901 Long term (current) use of anticoagulants: Secondary | ICD-10-CM

## 2021-05-24 DIAGNOSIS — E1159 Type 2 diabetes mellitus with other circulatory complications: Secondary | ICD-10-CM | POA: Diagnosis present

## 2021-05-24 HISTORY — DX: Unspecified atrial fibrillation: I48.91

## 2021-05-24 LAB — CBC
HCT: 43.9 % (ref 36.0–46.0)
Hemoglobin: 15.8 g/dL — ABNORMAL HIGH (ref 12.0–15.0)
MCH: 31.9 pg (ref 26.0–34.0)
MCHC: 36 g/dL (ref 30.0–36.0)
MCV: 88.7 fL (ref 80.0–100.0)
Platelets: 275 10*3/uL (ref 150–400)
RBC: 4.95 MIL/uL (ref 3.87–5.11)
RDW: 11.9 % (ref 11.5–15.5)
WBC: 7.5 10*3/uL (ref 4.0–10.5)
nRBC: 0 % (ref 0.0–0.2)

## 2021-05-24 LAB — BASIC METABOLIC PANEL
Anion gap: 13 (ref 5–15)
BUN: 16 mg/dL (ref 6–20)
CO2: 24 mmol/L (ref 22–32)
Calcium: 10.3 mg/dL (ref 8.9–10.3)
Chloride: 102 mmol/L (ref 98–111)
Creatinine, Ser: 0.76 mg/dL (ref 0.44–1.00)
GFR, Estimated: >60 mL/min (ref >60)
Glucose, Bld: 189 mg/dL — ABNORMAL HIGH (ref 70–99)
Potassium: 3.7 mmol/L (ref 3.5–5.1)
Sodium: 139 mmol/L (ref 135–145)

## 2021-05-24 LAB — MAGNESIUM: Magnesium: 2 mg/dL (ref 1.7–2.4)

## 2021-05-24 LAB — CBG MONITORING, ED: Glucose-Capillary: 246 mg/dL — ABNORMAL HIGH (ref 70–99)

## 2021-05-24 LAB — BRAIN NATRIURETIC PEPTIDE: B Natriuretic Peptide: 38.4 pg/mL (ref 0.0–100.0)

## 2021-05-24 LAB — RESP PANEL BY RT-PCR (FLU A&B, COVID) ARPGX2
Influenza A by PCR: NEGATIVE
Influenza B by PCR: NEGATIVE
SARS Coronavirus 2 by RT PCR: NEGATIVE

## 2021-05-24 LAB — TROPONIN I (HIGH SENSITIVITY)
Troponin I (High Sensitivity): 8 ng/L (ref <18)
Troponin I (High Sensitivity): 8 ng/L (ref <18)

## 2021-05-24 MED ORDER — DILTIAZEM HCL-DEXTROSE 125-5 MG/125ML-% IV SOLN (PREMIX)
5.0000 mg/h | INTRAVENOUS | Status: DC
  Administered 2021-05-24: 5 mg/h via INTRAVENOUS
  Filled 2021-05-24: qty 125

## 2021-05-24 MED ORDER — POTASSIUM CHLORIDE CRYS ER 20 MEQ PO TBCR
40.0000 meq | EXTENDED_RELEASE_TABLET | Freq: Once | ORAL | Status: AC
  Administered 2021-05-24: 40 meq via ORAL
  Filled 2021-05-24: qty 2

## 2021-05-24 MED ORDER — METFORMIN HCL 500 MG PO TABS
500.0000 mg | ORAL_TABLET | Freq: Two times a day (BID) | ORAL | Status: DC
  Administered 2021-05-24: 500 mg via ORAL
  Filled 2021-05-24: qty 1

## 2021-05-24 MED ORDER — LOSARTAN POTASSIUM 50 MG PO TABS
100.0000 mg | ORAL_TABLET | Freq: Every day | ORAL | Status: DC
  Administered 2021-05-24: 100 mg via ORAL
  Filled 2021-05-24: qty 2

## 2021-05-24 MED ORDER — INSULIN ASPART 100 UNIT/ML IJ SOLN: 0.0000 [IU] | Freq: Every day | INTRAMUSCULAR | Status: DC

## 2021-05-24 MED ORDER — ACETAMINOPHEN 325 MG PO TABS: 650.0000 mg | ORAL_TABLET | Freq: Four times a day (QID) | ORAL | Status: DC | PRN

## 2021-05-24 MED ORDER — HYDRALAZINE HCL 20 MG/ML IJ SOLN: 5.0000 mg | INTRAMUSCULAR | Status: DC | PRN

## 2021-05-24 MED ORDER — DICLOFENAC SODIUM 25 MG PO TBEC
50.0000 mg | DELAYED_RELEASE_TABLET | Freq: Two times a day (BID) | ORAL | Status: DC
  Administered 2021-05-24: 50 mg via ORAL
  Filled 2021-05-24 (×2): qty 2

## 2021-05-24 MED ORDER — DILTIAZEM HCL 25 MG/5ML IV SOLN
15.0000 mg | Freq: Once | INTRAVENOUS | Status: AC
  Administered 2021-05-24: 15 mg via INTRAVENOUS
  Filled 2021-05-24: qty 5

## 2021-05-24 MED ORDER — APIXABAN 5 MG PO TABS
5.0000 mg | ORAL_TABLET | Freq: Two times a day (BID) | ORAL | Status: DC
  Administered 2021-05-24: 5 mg via ORAL
  Filled 2021-05-24: qty 1

## 2021-05-24 MED ORDER — NICOTINE 21 MG/24HR TD PT24: 21.0000 mg | MEDICATED_PATCH | Freq: Every day | TRANSDERMAL | Status: DC

## 2021-05-24 MED ORDER — DILTIAZEM HCL ER COATED BEADS 300 MG PO CP24
300.0000 mg | ORAL_CAPSULE | Freq: Every day | ORAL | Status: DC
  Administered 2021-05-24: 300 mg via ORAL
  Filled 2021-05-24: qty 1

## 2021-05-24 MED ORDER — ONDANSETRON HCL 4 MG/2ML IJ SOLN: 4.0000 mg | Freq: Three times a day (TID) | INTRAMUSCULAR | Status: DC | PRN

## 2021-05-24 MED ORDER — HYDROCHLOROTHIAZIDE 12.5 MG PO CAPS
12.5000 mg | ORAL_CAPSULE | Freq: Every day | ORAL | Status: DC
  Administered 2021-05-24: 12.5 mg via ORAL
  Filled 2021-05-24: qty 1

## 2021-05-24 MED ORDER — INSULIN ASPART 100 UNIT/ML IJ SOLN: 0.0000 [IU] | Freq: Three times a day (TID) | INTRAMUSCULAR | Status: DC

## 2021-05-24 NOTE — Discharge Instructions (Signed)
You were cared for by a hospitalist during your hospital stay. If you have any questions about your discharge medications or the care you received while you were in the hospital after you are discharged, you can call the unit and ask to speak with the hospitalist on call if the hospitalist that took care of you is not available. Once you are discharged, your primary care physician will handle any further medical issues. Please note that NO REFILLS for any discharge medications will be authorized once you are discharged, as it is imperative that you return to your primary care physician (or establish a relationship with a primary care physician if you do not have one) for your aftercare needs so that they can reassess your need for medications and monitor your lab values.  Follow up with PCP and cardiologist as soon as possible. Take all medications as prescribed. If symptoms change or worsen please return to the ED for evaluation

## 2021-05-24 NOTE — Discharge Summary (Signed)
Physician Discharge Summary  Teandra Harlan Lampley OIN:[id number] DOB: Jan 04, 1966 DOA: 05/24/2021  PCP: Jearld Fenton, NP  Admit date: 05/24/2021 Discharge date: 05/24/2021  Recommendations for Outpatient Follow-up:  1. Follow up with PCP in 1 weeks 2.   F/u with card tomorrow and card NP, Laurann Montana in 5 days    Home Health: none Equipment/Devices: none  Discharge Condition: stable  CODE STATUS: full Diet recommendation: heart/carb modified  Brief/Interim Summary (HPI)  Wanda Aguilar is a 55 y.o. female with medical history significant of atrial fibrillation on Eliquis, hypertension, hyperlipidemia, diabetes mellitus, fibroid, headache, tobacco abuse, who presents with palpitation.  Patient states that she started having palpitation, and her heart was "going crazy" in the early morning at about 3:00 AM.  Patient had chest pressure and chest heaviness earlier, which has resolved.  Denies shortness breath, cough, fever or chills.  No nausea, vomiting, diarrhea or abdominal pain.  No symptoms of UTI.  Patient was found to have atrial fibrillation with RVR with heart rate up to 162, Cardizem drip started in ED. Patient is converted to sinus rhythm.  Heart rate improved to 80s in ED.   ED Course: pt was found to have WBC 7.5, troponin level 8, negative COVID PCR, electrolytes renal function okay, temperature normal, blood pressure 137/81, RR 27, oxygen saturation 98% on room air.  Chest x-ray negative.  Patient patient was admitted to progressive bed by accepting MD.    Subjective  -Palpitation and chest heaviness which has resolved.  Discharge Diagnoses and Hospital Course:   Principal Problem:   Atrial fibrillation with RVR ([place]) Active Problems:   Essential hypertension   Type 2 diabetes mellitus without complication, without long-term current use of insulin (HCC)   Rapid atrial fibrillation (HCC)   Tobacco user   Principal Problem:   Atrial fibrillation with RVR  (HCC) Active Problems:   Essential hypertension   Type 2 diabetes mellitus without complication, without long-term current use of insulin (HCC)   Rapid atrial fibrillation (HCC)   Tobacco user    Atrial fibrillation with RVR  [facility]): Patient initially presented with atrial fibrillation with RVR, heart rate up to 162.  Patient was started on Cardizem drip. She responded to this treatment quickly, converted to sinus rhythm.  Heart rate has been 80s.  Patient is asymptomatic now.  Her chest pressure has resolved.  No shortness of breath.  No palpitation currently.  Patient states that she has appoint with cardiology tomorrow. It seems that she has appointment with Dr. Garen Lah tomorrow in [place]. I will also let her f/u with her PCP and her card NP, Laurann Montana  -pt was treated with Cardizem gtt -continued her diltiazem -continued Eliquis  Essential hypertension -diltiazem, HCTZ, Cozaar  Type 2 diabetes mellitus without complication, without long-term current use of insulin ([place]): Recent A1c 7.3, poorly controlled.  Patient is taking metformin.  Initially ordered a sliding scale insulin, but patient would like to continue her home metformin.  Her blood sugar is 110, I think metformin is okay. -Continue metformin  Tobacco user -Nicotine patch in hopital  HLD (hyperlipidemia): Pt is not taking meds now -f/u with PCP   Discharge Instructions  Discharge Instructions    Call MD for:  difficulty breathing, headache or visual disturbances   Complete by: As directed    Call MD for:  persistant dizziness or light-headedness   Complete by: As directed    Call MD for:  severe uncontrolled pain   Complete by: As directed  Call MD for:  temperature >100.4   Complete by: As directed    Diet - low sodium heart healthy   Complete by: As directed    Increase activity slowly   Complete by: As directed      Allergies as of 05/24/2021   No Known Allergies     Medication List     TAKE these medications   apixaban 5 MG Tabs tablet Commonly known as: ELIQUIS Take 1 tablet (5 mg total) by mouth 2 (two) times daily.   diclofenac 50 MG EC tablet Commonly known as: VOLTAREN Take 50 mg by mouth 2 (two) times daily.   diltiazem 300 MG 24 hr capsule Commonly known as: TIAZAC Take 1 capsule (300 mg total) by mouth daily.   hydrochlorothiazide 12.5 MG capsule Commonly known as: MICROZIDE TAKE 1 CAPSULE BY MOUTH EVERY DAY   losartan 100 MG tablet Commonly known as: COZAAR TAKE 1 TABLET BY MOUTH EVERY DAY   metFORMIN 500 MG tablet Commonly known as: GLUCOPHAGE Take 1 tablet (500 mg total) by mouth 2 (two) times daily with a meal.       Follow-up Information    Jearld Fenton, NP Follow up in 1 week(s).   Specialties: Internal Medicine, Emergency Medicine Contact information: [place] [address] [phone]        Kate Sable, MD .   Specialties: Cardiology, Radiology Contact information: [place] [address] [phone]        Loel Dubonnet, NP Follow up in 5 day(s).   Specialty: Cardiology Contact information: [place] [address] [phone]              No Known Allergies  Consultations:  none   Procedures/Studies: DG Chest Portable 1 View  Result Date: 05/24/2021 CLINICAL DATA:  Chest pain. EXAM: PORTABLE CHEST 1 VIEW COMPARISON:  01/21/2021.  09/17/2016. FINDINGS: Apical lordotic projection. Mediastinum stable. Tortuous thoracic aorta. Heart size stable. Low lung volumes. No focal infiltrate. No pleural effusion or pneumothorax. No acute bony abnormality. IMPRESSION: Low lung volumes.  No acute cardiopulmonary disease identified. Electronically Signed   By: Marcello Moores  Register   On: 05/24/2021 06:05   DG Foot 2 Views Left  Result Date: 05/12/2021 Please see detailed radiograph report in office note.  DG Foot 2 Views Right  Result Date:  05/12/2021 Please see detailed radiograph report in office note.     Discharge Exam: Vitals:   05/24/21 1500 05/24/21 1530  BP: (!) 150/82 (!) 148/98  Pulse: 65 60  Resp: 17 (!) 21  Temp:    SpO2: 97% 97%   Vitals:   05/24/21 1400 05/24/21 1430 05/24/21 1500 05/24/21 1530  BP: (!) 147/88 (!) 142/102 (!) 150/82 (!) 148/98  Pulse: 65 72 65 60  Resp: 20 14 17  (!) 21  Temp:      TempSrc:      SpO2: 97% 97% 97% 97%  Weight:      Height:        General: Not in acute distress HEENT:       Eyes: PERRL, EOMI, no scleral icterus.       ENT: No discharge from the ears and nose, no pharynx injection, no tonsillar enlargement.        Neck: No JVD, no bruit, no mass felt. Heme: No neck lymph node enlargement. Cardiac: S1/S2, RRR, No murmurs, No gallops or rubs. Respiratory: No rales, wheezing, rhonchi or rubs.  GI: Soft, nondistended, nontender, no rebound pain, no organomegaly, BS present. GU: No hematuria Ext: No pitting leg edema bilaterally. 1+DP/PT pulse bilaterally. Musculoskeletal: No joint deformities, No joint redness or warmth, no limitation of ROM in spin. Skin: No rashes.  Neuro: Alert, oriented X3, cranial nerves II-XII grossly intact, moves all extremities normally.  Psych: Patient is not psychotic, no suicidal or hemocidal ideation.     The results of significant diagnostics from this hospitalization (including imaging, microbiology, ancillary and laboratory) are listed below for reference.     Microbiology: Recent Results (from the past 240 hour(s))  Resp Panel by RT-PCR (Flu A&B, Covid) Nasopharyngeal Swab     Status: None   Collection Time: 05/24/21  6:19 AM   Specimen: Nasopharyngeal Swab; Nasopharyngeal(NP) swabs in vial transport medium  Result Value Ref Range Status   SARS Coronavirus 2 by RT PCR NEGATIVE NEGATIVE Final    Comment: (NOTE) SARS-CoV-2 target nucleic acids are NOT DETECTED.  The SARS-CoV-2 RNA is generally detectable in upper  respiratory specimens during the acute phase of infection. The lowest concentration of SARS-CoV-2 viral copies this assay can detect is 138 copies/mL. A negative result does not preclude SARS-Cov-2 infection and should not be used as the sole basis for treatment or other patient management decisions. A negative result may occur with  improper specimen collection/handling, submission of specimen other than nasopharyngeal swab, presence of viral mutation(s) within the areas targeted by this assay, and inadequate number of viral copies(<138 copies/mL). A negative result must be combined with clinical observations, patient history, and epidemiological information. The expected result is Negative.  Fact Sheet for Patients:  EntrepreneurPulse.com.au  Fact Sheet for Healthcare Providers:  IncredibleEmployment.be  This test is no t yet approved or cleared by the [place] FDA and  has been authorized for detection and/or diagnosis of SARS-CoV-2 by FDA under an Emergency Use Authorization (EUA). This EUA will remain  in effect (meaning this test can be used) for the duration of the COVID-19 declaration under Section 564(b)(1) of the Act, 21 U.S.C.section 360bbb-3(b)(1), unless the authorization is terminated  or revoked sooner.       Influenza A by PCR NEGATIVE NEGATIVE Final   Influenza B by PCR NEGATIVE NEGATIVE Final    Comment: (NOTE) The Xpert Xpress SARS-CoV-2/FLU/RSV plus assay is intended as an aid in the diagnosis of influenza from Nasopharyngeal swab specimens and should not be used as a sole basis for treatment. Nasal washings and aspirates are unacceptable for Xpert Xpress SARS-CoV-2/FLU/RSV testing.  Fact Sheet for Patients: EntrepreneurPulse.com.au  Fact Sheet for Healthcare Providers: IncredibleEmployment.be  This test is not yet approved or cleared by the [place] FDA and has been  authorized for detection and/or diagnosis of SARS-CoV-2 by FDA under an Emergency Use Authorization (EUA). This EUA will remain in effect (meaning this test can be used) for the duration of the COVID-19 declaration under Section 564(b)(1) of the Act, 21 U.S.C. section 360bbb-3(b)(1), unless the authorization is terminated or revoked.  Performed at [facility], [address] [place], [place] [address]      Labs: BNP (last 3 results) Recent Labs    05/24/21 0517  BNP 24.0   Basic Metabolic Panel: Recent Labs  Lab 05/24/21 0517  NA 139  K 3.7  CL 102  CO2 24  GLUCOSE 189*  BUN 16  CREATININE 0.76  CALCIUM 10.3  MG 2.0   Liver Function Tests: No results for input(s): AST, ALT, ALKPHOS, BILITOT, PROT, ALBUMIN in  the last 168 hours. No results for input(s): LIPASE, AMYLASE in the last 168 hours. No results for input(s): AMMONIA in the last 168 hours. CBC: Recent Labs  Lab 05/24/21 0517  WBC 7.5  HGB 15.8*  HCT 43.9  MCV 88.7  PLT 275   Cardiac Enzymes: No results for input(s): CKTOTAL, CKMB, CKMBINDEX, TROPONINI in the last 168 hours. BNP: Invalid input(s): POCBNP CBG: Recent Labs  Lab 05/24/21 1221  GLUCAP 246*   D-Dimer No results for input(s): DDIMER in the last 72 hours. Hgb A1c No results for input(s): HGBA1C in the last 72 hours. Lipid Profile No results for input(s): CHOL, HDL, LDLCALC, TRIG, CHOLHDL, LDLDIRECT in the last 72 hours. Thyroid function studies No results for input(s): TSH, T4TOTAL, T3FREE, THYROIDAB in the last 72 hours.  Invalid input(s): FREET3 Anemia work up No results for input(s): VITAMINB12, FOLATE, FERRITIN, TIBC, IRON, RETICCTPCT in the last 72 hours. Urinalysis No results found for: COLORURINE, APPEARANCEUR, [place], [place], GLUCOSEU, [place], [place], [place], PROTEINUR, UROBILINOGEN, NITRITE, LEUKOCYTESUR Sepsis Labs Invalid input(s): PROCALCITONIN,  WBC,  LACTICIDVEN Microbiology Recent Results  (from the past 240 hour(s))  Resp Panel by RT-PCR (Flu A&B, Covid) Nasopharyngeal Swab     Status: None   Collection Time: 05/24/21  6:19 AM   Specimen: Nasopharyngeal Swab; Nasopharyngeal(NP) swabs in vial transport medium  Result Value Ref Range Status   SARS Coronavirus 2 by RT PCR NEGATIVE NEGATIVE Final    Comment: (NOTE) SARS-CoV-2 target nucleic acids are NOT DETECTED.  The SARS-CoV-2 RNA is generally detectable in upper respiratory specimens during the acute phase of infection. The lowest concentration of SARS-CoV-2 viral copies this assay can detect is 138 copies/mL. A negative result does not preclude SARS-Cov-2 infection and should not be used as the sole basis for treatment or other patient management decisions. A negative result may occur with  improper specimen collection/handling, submission of specimen other than nasopharyngeal swab, presence of viral mutation(s) within the areas targeted by this assay, and inadequate number of viral copies(<138 copies/mL). A negative result must be combined with clinical observations, patient history, and epidemiological information. The expected result is Negative.  Fact Sheet for Patients:  EntrepreneurPulse.com.au  Fact Sheet for Healthcare Providers:  IncredibleEmployment.be  This test is no t yet approved or cleared by the [place] FDA and  has been authorized for detection and/or diagnosis of SARS-CoV-2 by FDA under an Emergency Use Authorization (EUA). This EUA will remain  in effect (meaning this test can be used) for the duration of the COVID-19 declaration under Section 564(b)(1) of the Act, 21 U.S.C.section 360bbb-3(b)(1), unless the authorization is terminated  or revoked sooner.       Influenza A by PCR NEGATIVE NEGATIVE Final   Influenza B by PCR NEGATIVE NEGATIVE Final    Comment: (NOTE) The Xpert Xpress SARS-CoV-2/FLU/RSV plus assay is intended as an aid in the  diagnosis of influenza from Nasopharyngeal swab specimens and should not be used as a sole basis for treatment. Nasal washings and aspirates are unacceptable for Xpert Xpress SARS-CoV-2/FLU/RSV testing.  Fact Sheet for Patients: EntrepreneurPulse.com.au  Fact Sheet for Healthcare Providers: IncredibleEmployment.be  This test is not yet approved or cleared by the [place] FDA and has been authorized for detection and/or diagnosis of SARS-CoV-2 by FDA under an Emergency Use Authorization (EUA). This EUA will remain in effect (meaning this test can be used) for the duration of the COVID-19 declaration under Section 564(b)(1) of the Act, 21 U.S.C. section 360bbb-3(b)(1), unless the authorization  is terminated or revoked.  Performed at [facility], [address] [place], [place] [address]     Time coordinating discharge:  29 minutes.   SIGNED:  Ivor Costa, MD [facility] 05/24/2021, 4:28 PM   If 7PM-7AM, please contact night-coverage [email or web address]

## 2021-05-24 NOTE — ED Notes (Signed)
Pt walked to bathroom and then walked back to her room and was hooked back up to monitor.

## 2021-05-24 NOTE — H&P (Signed)
History and Physical    Wanda Aguilar ZOX:[id number] DOB: 01/18/1966 DOA: 05/24/2021  Referring MD/NP/PA:   PCP: Jearld Fenton, NP   Patient coming from:  The patient is coming from home.  At baseline, pt is independent for most of ADL.        Chief Complaint: Palpitation  HPI: Wanda Aguilar is a 55 y.o. female with medical history significant of atrial fibrillation on Eliquis, hypertension, hyperlipidemia, diabetes mellitus, fibroid, headache, tobacco abuse, who presents with palpitation.  Patient states that she started having palpitation, and her heart was "going crazy" in the early morning at about 3:00 AM.  Patient had chest pressure and chest heaviness earlier, which has resolved.  Denies shortness breath, cough, fever or chills.  No nausea, vomiting, diarrhea or abdominal pain.  No symptoms of UTI.  Patient was found to have atrial fibrillation with RVR with heart rate up to 162, Cardizem drip started in ED. Patient is converted to sinus rhythm.  Heart rate improved to 80s in ED.   ED Course: pt was found to have WBC 7.5, troponin level 8, negative COVID PCR, electrolytes renal function okay, temperature normal, blood pressure 137/81, RR 27, oxygen saturation 98% on room air.  Chest x-ray negative.  Patient patient was admitted to progressive bed by accepting MD.  Review of Systems:   General: no fevers, chills, no body weight gain, fatigue HEENT: no blurry vision, hearing changes or sore throat Respiratory: no dyspnea, coughing, wheezing CV: chest pressure and palpitations GI: no nausea, vomiting, abdominal pain, diarrhea, constipation GU: no dysuria, burning on urination, increased urinary frequency, hematuria  Ext: no leg edema Neuro: no unilateral weakness, numbness, or tingling, no vision change or hearing loss Skin: no rash, no skin tear. MSK: No muscle spasm, no deformity, no limitation of range of movement in spin Heme: No easy bruising.  Travel history: No  recent long distant travel.  Allergy: No Known Allergies  Past Medical History:  Diagnosis Date  . Atrial fibrillation ([place])   . Fibroids   . Frequent headaches   . Hypertension   . Urine incontinence     Past Surgical History:  Procedure Laterality Date  . ABDOMINAL HYSTERECTOMY Bilateral 04/13/2014   Procedure: HYSTERECTOMY ABDOMINAL WITH BILATERAL SALPINGO OOPHORECTOMY;  Surgeon: Darlyn Chamber, MD;  Location: [facility];  Service: Gynecology;  Laterality: Bilateral;  . CARPAL TUNNEL RELEASE     Right Hand  . Ceaserean    . CESAREAN SECTION    . GANGLION CYST EXCISION     Bilateral  . TOOTH EXTRACTION      Social History:  reports that she has been smoking cigarettes. She started smoking about 12 years ago. She has a 1.20 pack-year smoking history. She has never used smokeless tobacco. She reports current alcohol use. She reports that she does not use drugs.  Family History:  Family History  Problem Relation Age of Onset  . Diabetes Father   . Diabetes Paternal Aunt   . Diabetes Paternal Uncle   . Arthritis Maternal Grandmother   . Alcohol abuse Maternal Grandfather   . Diabetes Paternal Grandmother   . Diabetes Paternal Grandfather      Prior to Admission medications   Medication Sig Start Date End Date Taking? Authorizing Provider  apixaban (ELIQUIS) 5 MG TABS tablet Take 1 tablet (5 mg total) by mouth 2 (two) times daily. 02/09/21   Loel Dubonnet, NP  diclofenac (VOLTAREN) 50 MG EC tablet Take 50 mg  by mouth 2 (two) times daily. 03/28/21   [provider]  diltiazem (TIAZAC) 300 MG 24 hr capsule Take 1 capsule (300 mg total) by mouth daily. 03/13/21   Loel Dubonnet, NP  hydrochlorothiazide (MICROZIDE) 12.5 MG capsule TAKE 1 CAPSULE BY MOUTH EVERY DAY 03/29/21   Jearld Fenton, NP  losartan (COZAAR) 100 MG tablet TAKE 1 TABLET BY MOUTH EVERY DAY 03/29/21   Jearld Fenton, NP  metFORMIN (GLUCOPHAGE) 500 MG tablet Take 1 tablet (500 mg total) by mouth 2 (two)  times daily with a meal. 05/16/21   Jearld Fenton, NP    Physical Exam: Vitals:   05/24/21 1400 05/24/21 1430 05/24/21 1500 05/24/21 1530  BP: (!) 147/88 (!) 142/102 (!) 150/82 (!) 148/98  Pulse: 65 72 65 60  Resp: 20 14 17  (!) 21  Temp:      TempSrc:      SpO2: 97% 97% 97% 97%  Weight:      Height:       General: Not in acute distress HEENT:       Eyes: PERRL, EOMI, no scleral icterus.       ENT: No discharge from the ears and nose, no pharynx injection, no tonsillar enlargement.        Neck: No JVD, no bruit, no mass felt. Heme: No neck lymph node enlargement. Cardiac: currently S1/S2, RRR, No murmurs, No gallops or rubs. Respiratory: No rales, wheezing, rhonchi or rubs. GI: Soft, nondistended, nontender, no rebound pain, no organomegaly, BS present. GU: No hematuria Ext: No pitting leg edema bilaterally. 1+DP/PT pulse bilaterally. Musculoskeletal: No joint deformities, No joint redness or warmth, no limitation of ROM in spin. Skin: No rashes.  Neuro: Alert, oriented X3, cranial nerves II-XII grossly intact, moves all extremities normally.  Psych: Patient is not psychotic, no suicidal or hemocidal ideation.  Labs on Admission: I have personally reviewed following labs and imaging studies  CBC: Recent Labs  Lab 05/24/21 0517  WBC 7.5  HGB 15.8*  HCT 43.9  MCV 88.7  PLT 254   Basic Metabolic Panel: Recent Labs  Lab 05/24/21 0517  NA 139  K 3.7  CL 102  CO2 24  GLUCOSE 189*  BUN 16  CREATININE 0.76  CALCIUM 10.3  MG 2.0   GFR: Estimated Creatinine Clearance: 84.5 mL/min (by C-G formula based on SCr of 0.76 mg/dL). Liver Function Tests: No results for input(s): AST, ALT, ALKPHOS, BILITOT, PROT, ALBUMIN in the last 168 hours. No results for input(s): LIPASE, AMYLASE in the last 168 hours. No results for input(s): AMMONIA in the last 168 hours. Coagulation Profile: No results for input(s): INR, PROTIME in the last 168 hours. Cardiac Enzymes: No results  for input(s): CKTOTAL, CKMB, CKMBINDEX, TROPONINI in the last 168 hours. BNP (last 3 results) No results for input(s): PROBNP in the last 8760 hours. HbA1C: No results for input(s): HGBA1C in the last 72 hours. CBG: Recent Labs  Lab 05/24/21 1221  GLUCAP 246*   Lipid Profile: No results for input(s): CHOL, HDL, LDLCALC, TRIG, CHOLHDL, LDLDIRECT in the last 72 hours. Thyroid Function Tests: No results for input(s): TSH, T4TOTAL, FREET4, T3FREE, THYROIDAB in the last 72 hours. Anemia Panel: No results for input(s): VITAMINB12, FOLATE, FERRITIN, TIBC, IRON, RETICCTPCT in the last 72 hours. Urine analysis: No results found for: COLORURINE, APPEARANCEUR, LABSPEC, PHURINE, GLUCOSEU, HGBUR, BILIRUBINUR, KETONESUR, PROTEINUR, UROBILINOGEN, NITRITE, LEUKOCYTESUR Sepsis Labs: @LABRCNTIP (procalcitonin:4,lacticidven:4) ) Recent Results (from the past 240 hour(s))  Resp Panel by RT-PCR (Flu  A&B, Covid) Nasopharyngeal Swab     Status: None   Collection Time: 05/24/21  6:19 AM   Specimen: Nasopharyngeal Swab; Nasopharyngeal(NP) swabs in vial transport medium  Result Value Ref Range Status   SARS Coronavirus 2 by RT PCR NEGATIVE NEGATIVE Final    Comment: (NOTE) SARS-CoV-2 target nucleic acids are NOT DETECTED.  The SARS-CoV-2 RNA is generally detectable in upper respiratory specimens during the acute phase of infection. The lowest concentration of SARS-CoV-2 viral copies this assay can detect is 138 copies/mL. A negative result does not preclude SARS-Cov-2 infection and should not be used as the sole basis for treatment or other patient management decisions. A negative result may occur with  improper specimen collection/handling, submission of specimen other than nasopharyngeal swab, presence of viral mutation(s) within the areas targeted by this assay, and inadequate number of viral copies(<138 copies/mL). A negative result must be combined with clinical observations, patient history, and  epidemiological information. The expected result is Negative.  Fact Sheet for Patients:  EntrepreneurPulse.com.au  Fact Sheet for Healthcare Providers:  IncredibleEmployment.be  This test is no t yet approved or cleared by the [place] FDA and  has been authorized for detection and/or diagnosis of SARS-CoV-2 by FDA under an Emergency Use Authorization (EUA). This EUA will remain  in effect (meaning this test can be used) for the duration of the COVID-19 declaration under Section 564(b)(1) of the Act, 21 U.S.C.section 360bbb-3(b)(1), unless the authorization is terminated  or revoked sooner.       Influenza A by PCR NEGATIVE NEGATIVE Final   Influenza B by PCR NEGATIVE NEGATIVE Final    Comment: (NOTE) The Xpert Xpress SARS-CoV-2/FLU/RSV plus assay is intended as an aid in the diagnosis of influenza from Nasopharyngeal swab specimens and should not be used as a sole basis for treatment. Nasal washings and aspirates are unacceptable for Xpert Xpress SARS-CoV-2/FLU/RSV testing.  Fact Sheet for Patients: EntrepreneurPulse.com.au  Fact Sheet for Healthcare Providers: IncredibleEmployment.be  This test is not yet approved or cleared by the [place] FDA and has been authorized for detection and/or diagnosis of SARS-CoV-2 by FDA under an Emergency Use Authorization (EUA). This EUA will remain in effect (meaning this test can be used) for the duration of the COVID-19 declaration under Section 564(b)(1) of the Act, 21 U.S.C. section 360bbb-3(b)(1), unless the authorization is terminated or revoked.  Performed at [facility], [address], [place], [place] [address]      Radiological Exams on Admission: DG Chest Portable 1 View  Result Date: 05/24/2021 CLINICAL DATA:  Chest pain. EXAM: PORTABLE CHEST 1 VIEW COMPARISON:  01/21/2021.  09/17/2016. FINDINGS: Apical lordotic projection.  Mediastinum stable. Tortuous thoracic aorta. Heart size stable. Low lung volumes. No focal infiltrate. No pleural effusion or pneumothorax. No acute bony abnormality. IMPRESSION: Low lung volumes.  No acute cardiopulmonary disease identified. Electronically Signed   By: Marcello Moores  Register   On: 05/24/2021 06:05     EKG: I have personally reviewed.  Atrial fibrillation with RVR, heart rate 158, early R wave progression, ST depression in inferior leads and precordial leads.  Assessment/Plan Principal Problem:   Atrial fibrillation with RVR (HCC) Active Problems:   Essential hypertension   Type 2 diabetes mellitus without complication, without long-term current use of insulin (HCC)   Tobacco user   HLD (hyperlipidemia)   Atrial fibrillation with RVR [facility]): Patient initially presented with atrial fibrillation with RVR, heart rate up to 162.  Patient was started on Cardizem drip. She responded  to this treatment quickly, converted to sinus rhythm.  Heart rate has been 80s.  Patient is asymptomatic now.  Her chest pressure has resolved.  No shortness of breath.  No palpitation currently.  Patient states that she has appoint with cardiology tomorrow. It seems that she has appointment with Dr. Garen Lah tomorrow in [place]. I will also let her f/u with her PCP and her card NP, Laurann Montana  -pt was initially admitted as inpt by accepting MD.  -pt was treated with Cardizem gtt -continued her diltiazem -continue Eliquis  Essential hypertension -diltiazem -HCTZ, Cozaar -IV hydralazine as needed  Type 2 diabetes mellitus without complication, without long-term current use of insulin ([place]): Recent A1c 7.3, poorly controlled.  Patient is taking metformin.  Initially ordered a sliding scale insulin, but patient would like to continue her home metformin.  Her blood sugar is 110, I think metformin is okay. -Continue metformin  Tobacco user -Nicotine patch in hopital  HLD (hyperlipidemia): Pt is not  taking meds now -f/u with PCP     DVT ppx: on Eliquis Code Status: Full code Family Communication: not done, no family member is at bed side.    Disposition Plan:  Anticipate discharge back to previous environment Consults called:  none Admission status and Level of care: Progressive Cardiac:   as inpt       Status is: Inpatient  Remains inpatient appropriate because:Inpatient level of care appropriate due to severity of illness   Dispo: The patient is from: Home              Anticipated d/c is to: Home              Patient currently is not medically stable to d/c.   Difficult to place patient No          Date of Service 05/24/2021    Ivor Costa [facility]   If 7PM-7AM, please contact night-coverage [email or web address] 05/24/2021, 4:33 PM

## 2021-05-24 NOTE — ED Provider Notes (Signed)
[facility] Emergency Department Provider Note   ____________________________________________   I have reviewed the triage vital signs and the nursing notes.   HISTORY  Chief Complaint Chest Pain   History limited by: Not Limited   HPI Wanda Aguilar is a 55 y.o. female who presents to the emergency department today because of concerns for chest pressure discomfort and possible A. Fib.  The patient states that she was first diagnosed with A. fib back in January when it was going very fast.  At that time she states it was related to [place].  She states that she woke up in the middle of the night tonight with similar symptoms to what she felt in January.  This included chest pressure and palpitations.  She stated that she felt like her heart was racing.  Yesterday she she says that she was under a lot of stress and did have occasional feelings of fluttering in her chest.  She is followed by cardiology for A. fib.  She states that she has been on Eliquis since her diagnosis in January.  Denies any recent illness.   Records reviewed. Per medical record review patient has a history of atrial fibrillation.  Past Medical History:  Diagnosis Date  . Atrial fibrillation ([place])   . Fibroids   . Frequent headaches   . Hypertension   . Urine incontinence     Patient Active Problem List   Diagnosis Date Noted  . Rapid atrial fibrillation ([place]) 05/24/2021  . Type 2 diabetes mellitus without complication, without long-term current use of insulin ([place]) 05/17/2021  . Paroxysmal atrial fibrillation ([place]) 02/09/2021  . Hypokalemia 02/09/2021  . Frequent headaches 09/13/2018  . Insomnia 10/11/2016  . Anxiety state 03/04/2014  . Essential hypertension 02/12/2014    Past Surgical History:  Procedure Laterality Date  . ABDOMINAL HYSTERECTOMY Bilateral 04/13/2014   Procedure: HYSTERECTOMY ABDOMINAL WITH BILATERAL SALPINGO OOPHORECTOMY;  Surgeon: Darlyn Chamber, MD;   Location: [facility];  Service: Gynecology;  Laterality: Bilateral;  . CARPAL TUNNEL RELEASE     Right Hand  . Ceaserean    . CESAREAN SECTION    . GANGLION CYST EXCISION     Bilateral  . TOOTH EXTRACTION      Prior to Admission medications   Medication Sig Start Date End Date Taking? Authorizing Provider  apixaban (ELIQUIS) 5 MG TABS tablet Take 1 tablet (5 mg total) by mouth 2 (two) times daily. 02/09/21   Loel Dubonnet, NP  diclofenac (VOLTAREN) 50 MG EC tablet Take 50 mg by mouth 2 (two) times daily. 03/28/21   [provider]  diltiazem (TIAZAC) 300 MG 24 hr capsule Take 1 capsule (300 mg total) by mouth daily. 03/13/21   Loel Dubonnet, NP  hydrochlorothiazide (MICROZIDE) 12.5 MG capsule TAKE 1 CAPSULE BY MOUTH EVERY DAY 03/29/21   Jearld Fenton, NP  losartan (COZAAR) 100 MG tablet TAKE 1 TABLET BY MOUTH EVERY DAY 03/29/21   Jearld Fenton, NP  metFORMIN (GLUCOPHAGE) 500 MG tablet Take 1 tablet (500 mg total) by mouth 2 (two) times daily with a meal. 05/16/21   Baity, Coralie Keens, NP    Allergies Patient has no known allergies.  Family History  Problem Relation Age of Onset  . Diabetes Father   . Diabetes Paternal Aunt   . Diabetes Paternal Uncle   . Arthritis Maternal Grandmother   . Alcohol abuse Maternal Grandfather   . Diabetes Paternal Grandmother   . Diabetes Paternal Grandfather  Social History Social History   Tobacco Use  . Smoking status: Current Some Day Smoker    Packs/day: 0.30    Years: 4.00    Pack years: 1.20    Types: Cigarettes    Start date: 01/14/2009  . Smokeless tobacco: Never Used  Vaping Use  . Vaping Use: Never used  Substance Use Topics  . Alcohol use: Yes    Comment: occasional  . Drug use: No    Review of Systems Constitutional: No fever/chills Eyes: No visual changes. ENT: No sore throat. Cardiovascular: Positive for chest pressure. Positive for palpitations.  Respiratory: Denies shortness of breath. Gastrointestinal:  No abdominal pain.  No nausea, no vomiting.  No diarrhea.   Genitourinary: Negative for dysuria. Musculoskeletal: Negative for back pain. Skin: Negative for rash. Neurological: Negative for headaches, focal weakness or numbness.  ____________________________________________   PHYSICAL EXAM:  VITAL SIGNS: ED Triage Vitals  Enc Vitals Group     BP 05/24/21 0518 (!) 176/112     Pulse Rate 05/24/21 0518 (!) 149     Resp 05/24/21 0518 (!) 22     Temp 05/24/21 0523 98.5 F (36.9 C)     Temp Source 05/24/21 0523 Oral     SpO2 05/24/21 0518 99 %     Weight 05/24/21 0509 194 lb (88 kg)     Height 05/24/21 0509 5\' 3"  (1.6 m)     Head Circumference --      Peak Flow --      Pain Score 05/24/21 0509 8   Constitutional: Alert and oriented.  Eyes: Conjunctivae are normal.  ENT      Head: Normocephalic and atraumatic.      Nose: No congestion/rhinnorhea.      Mouth/Throat: Mucous membranes are moist.      Neck: No stridor. Hematological/Lymphatic/Immunilogical: No cervical lymphadenopathy. Cardiovascular: Tachycardia, irregular rhythm.  No murmurs, rubs, or gallops.  Respiratory: Normal respiratory effort without tachypnea nor retractions. Breath sounds are clear and equal bilaterally. No wheezes/rales/rhonchi. Gastrointestinal: Soft and non tender. No rebound. No guarding.  Genitourinary: Deferred Musculoskeletal: Normal range of motion in all extremities. No lower extremity edema. Neurologic:  Normal speech and language. No gross focal neurologic deficits are appreciated.  Skin:  Skin is warm, dry and intact. No rash noted. Psychiatric: Anxious  ____________________________________________    LABS (pertinent positives/negatives)  COVID negative Trop hs 8 BMP wnl except glu 189 CBC wbc 7.5, hgb 15.8, plt 275  ____________________________________________   EKG  I, Nance Pear, attending physician, personally viewed and interpreted this EKG  EKG Time: 0510 Rate:  158 Rhythm: afib with rvr Axis: normal Intervals: qtc 470 QRS: narrow ST changes: no st elevation Impression: abnormal ekg   ____________________________________________    RADIOLOGY  CXR No acute cardiopulmonary disease  ____________________________________________   PROCEDURES  Procedures  ____________________________________________   INITIAL IMPRESSION / ASSESSMENT AND PLAN / ED COURSE  Pertinent labs & imaging results that were available during my care of the patient were reviewed by me and considered in my medical decision making (see chart for details).   Patient presented to the emergency department today because of concerns for chest pressure, palpitations and concerned that her atrial fibrillation is back.  Patient's EKG is consistent with A. fib with RVR.  Patient was given initial dose of diltiazem without any significant change in her heart rate.  Patient is on Eliquis so I did have a discussion with the patient about possible electrical cardioversion.  At this time patient  does not want to have cardioversion performed. Will place on diltiazem drip and admit to hospitalist service.   ____________________________________________   FINAL CLINICAL IMPRESSION(S) / ED DIAGNOSES  Final diagnoses:  Atrial fibrillation with RVR ([place])     Note: This dictation was prepared with Dragon dictation. Any transcriptional errors that result from this process are unintentional     Nance Pear, MD 05/24/21 [phone]

## 2021-05-24 NOTE — ED Notes (Signed)
Blood sent to lab: Lt. Green, lavender, blue, and SST tube.

## 2021-05-24 NOTE — Progress Notes (Addendum)
Inpatient Diabetes Program Recommendations  AACE/ADA: New Consensus Statement on Inpatient Glycemic Control (2015)  Target Ranges:  Prepandial:   less than 140 mg/dL      Peak postprandial:   less than 180 mg/dL (1-2 hours)      Critically ill patients:  140 - 180 mg/dL   Lab Results  Component Value Date   GLUCAP 246 (H) 05/24/2021   HGBA1C 7.3 (A) 05/16/2021    Review of Glycemic Control  Diabetes history: DM 2 Outpatient Diabetes medications: metformin 500 mg bid Current orders for Inpatient glycemic control:  Metformin 500 mg bid  Inpatient Diabetes Program Recommendations:    -  D/c metformin -  Start Novolog 0-15 units tid + hs   Thanks,  Tama Headings RN, MSN, BC-ADM Inpatient Diabetes Coordinator Team Pager [phone] (8a-5p)

## 2021-05-24 NOTE — ED Notes (Signed)
Dr. Goodman at bedside.  

## 2021-05-24 NOTE — ED Triage Notes (Signed)
Pt reports awakening at 3am with sensation of chest heaviness and "heart going crazy"; st hx of a fib

## 2021-05-24 NOTE — ED Notes (Signed)
Pt got meal tray but it wasn't appealing and didn't eat it.

## 2021-05-25 ENCOUNTER — Encounter: Payer: Self-pay | Admitting: Medical

## 2021-05-25 ENCOUNTER — Other Ambulatory Visit: Payer: Self-pay | Admitting: Internal Medicine

## 2021-05-25 ENCOUNTER — Ambulatory Visit (INDEPENDENT_AMBULATORY_CARE_PROVIDER_SITE_OTHER): Payer: No Typology Code available for payment source | Admitting: Medical

## 2021-05-25 VITALS — BP 126/74 | HR 76 | Ht 63.0 in | Wt 189.0 lb

## 2021-05-25 DIAGNOSIS — Z7901 Long term (current) use of anticoagulants: Secondary | ICD-10-CM | POA: Diagnosis not present

## 2021-05-25 DIAGNOSIS — E876 Hypokalemia: Secondary | ICD-10-CM | POA: Diagnosis not present

## 2021-05-25 DIAGNOSIS — I480 Paroxysmal atrial fibrillation: Secondary | ICD-10-CM | POA: Diagnosis not present

## 2021-05-25 DIAGNOSIS — I10 Essential (primary) hypertension: Secondary | ICD-10-CM | POA: Diagnosis not present

## 2021-05-25 DIAGNOSIS — R0683 Snoring: Secondary | ICD-10-CM

## 2021-05-25 DIAGNOSIS — Z0000 Encounter for general adult medical examination without abnormal findings: Secondary | ICD-10-CM

## 2021-05-25 MED ORDER — DILTIAZEM HCL ER BEADS 360 MG PO CP24: 360.0000 mg | ORAL_CAPSULE | Freq: Every day | ORAL | 5 refills | Status: DC

## 2021-05-25 MED ORDER — HYDROCHLOROTHIAZIDE 25 MG PO TABS: 25.0000 mg | ORAL_TABLET | Freq: Every day | ORAL | 6 refills | Status: DC

## 2021-05-25 MED ORDER — FLUOXETINE HCL 10 MG PO CAPS: 10.0000 mg | ORAL_CAPSULE | Freq: Every day | ORAL | 2 refills | Status: DC

## 2021-05-25 MED ORDER — DILTIAZEM HCL 30 MG PO TABS: ORAL_TABLET | ORAL | 5 refills | Status: DC

## 2021-05-25 NOTE — Patient Instructions (Signed)
Medication Instructions:   Your physician has recommended you make the following change in your medication:   1.  START taking diltiazem(CARDIZEM) 30 MG up to 3X a day as needed for a heart rate of      110 or higher.  2.  INCREASE your diltiazem (TIAZAC) to 360 MG once a day.  3.  INCREASE your Hydrochlorothiazide to 25 MG once a day.  *If you need a refill on your cardiac medications before your next appointment, please call your pharmacy*   Lab Work:  Your physician recommends that you return for lab work in:  In 1 week  - Please go to the [facility]. You will check in at the front desk to the right as you walk into the atrium. Valet Parking is offered if needed. - No appointment needed. You may go any day between 7 am and 6 pm.   Testing/Procedures: None ordered    Follow-Up: At [facility], you and your health needs are our priority.  As part of our continuing mission to provide you with exceptional heart care, we have created designated Provider Care Teams.  These Care Teams include your primary Cardiologist (physician) and Advanced Practice Providers (APPs -  Physician Assistants and Nurse Practitioners) who all work together to provide you with the care you need, when you need it.  We recommend signing up for the patient portal called "MyChart".  Sign up information is provided on this After Visit Summary.  MyChart is used to connect with patients for Virtual Visits (Telemedicine).  Patients are able to view lab/test results, encounter notes, upcoming appointments, etc.  Non-urgent messages can be sent to your provider as well.   To learn more about what you can do with MyChart, go to [email or web address].    Your next appointment:   3 month(s)  The format for your next appointment:   In Person  Provider:   You may see Kate Sable, MD or one of the following Advanced Practice Providers on your designated Care Team:    Murray Hodgkins, NP  Christell Faith, PA-C  Marrianne Mood, PA-C  Cadence Cumberland, [place]  Laurann Montana, NP    Other Instructions

## 2021-05-25 NOTE — Progress Notes (Deleted)
Cardiology Office Note:    Date:  05/25/2021   ID:  Cordella Register Donaho, DOB [birth year], MRN [id number]  PCP:  Jearld Fenton, NP  [facility] Cardiologist:  Kate Sable, MD  [place] Electrophysiologist:  None   Referring MD: Jearld Fenton, NP   Chief Complaint: Zio monitor f/u/ER f/u  History of Present Illness:    MISCHELL Aguilar is a 55 y.o. female with a hx of paroxysmal afib, COVID 19 12/2020, HTN, hypokalemia who presents for follow-up.   The patient presented to [facility] 01/21/21 with chest pain and palpitations found to be in afib RVR. She was treated with cardizem and metoprolol. Anticoagulation with Eliquis was initiated due to [facility] of at least 2 (female, HTN). She was COVID positive but no respiratory distress or infiltrates on imaging. Amlodipine was discontinued and cardizem was added.   Echo 02/07/21 showed LVEF 60-65%, G1DD, RV normal aize and function, mild MR.   Last seen 2/17 and a Zio was placed to assess for recurrent afib.   Recently diagnosed with diabetes, started on metformin last week.   She went to the ER 05/24/21. She woke up and felt chest pressure, chest felt heavy. Had palpitations. She was pretty sure it was afib. Woke husband up and went to the ER. This is the first recurrence. She was started on cardizem drip. She did not want to undergo cardioversion.   Today, she is in SR with heart rate 76bpm. She is feeling better than yesterday, but has overall been tired since she had COVID. She is going through menopause so has a lot of unexplained symptoms, unsure the relation of her symptoms to her heart. She is teary-eyed during interview and very worried about going back into afib the possible complications from it. Afib education discussed in detail with the patient. No alcohol use or significant caffeine use. She does snore every night. No ond or orthopnea. Reports some lower leg edema. No orthopnea, pnd, lightheadedness or dizziness.   Afib and new  dx is very stressful. Also job is very stressful.   Past Medical History:  Diagnosis Date  . Atrial fibrillation ([place])   . Fibroids   . Frequent headaches   . Hypertension   . Urine incontinence     Past Surgical History:  Procedure Laterality Date  . ABDOMINAL HYSTERECTOMY Bilateral 04/13/2014   Procedure: HYSTERECTOMY ABDOMINAL WITH BILATERAL SALPINGO OOPHORECTOMY;  Surgeon: Darlyn Chamber, MD;  Location: [facility];  Service: Gynecology;  Laterality: Bilateral;  . CARPAL TUNNEL RELEASE     Right Hand  . Ceaserean    . CESAREAN SECTION    . GANGLION CYST EXCISION     Bilateral  . TOOTH EXTRACTION      Current Medications: No outpatient medications have been marked as taking for the 05/25/21 encounter (Appointment) with Kathlen Mody, Myonna Chisom H, PA-C.     Allergies:   Patient has no known allergies.   Social History   Socioeconomic History  . Marital status: Divorced    Spouse name: Not on file  . Number of children: Not on file  . Years of education: Not on file  . Highest education level: Not on file  Occupational History  . Not on file  Tobacco Use  . Smoking status: Current Some Day Smoker    Packs/day: 0.30    Years: 4.00    Pack years: 1.20    Types: Cigarettes    Start date: 01/14/2009  . Smokeless tobacco: Never Used  Vaping Use  . Vaping Use: Never used  Substance and Sexual Activity  . Alcohol use: Yes    Comment: occasional  . Drug use: No  . Sexual activity: Yes  Other Topics Concern  . Not on file  Social History Narrative  . Not on file   Social Determinants of Health   Financial Resource Strain: Not on file  Food Insecurity: Not on file  Transportation Needs: Not on file  Physical Activity: Not on file  Stress: Not on file  Social Connections: Not on file     Family History: The patient's *family history includes Alcohol abuse in her maternal grandfather; Arthritis in her maternal grandmother; Diabetes in her father, paternal aunt, paternal  grandfather, paternal grandmother, and paternal uncle.  ROS:   Please see the history of present illness.     All other systems reviewed and are negative.  EKGs/Labs/Other Studies Reviewed:    The following studies were reviewed today: Echo 02/07/21 1. Left ventricular ejection fraction, by estimation, is 60 to 65%. The  left ventricle has normal function. The left ventricle has no regional  wall motion abnormalities. Left ventricular diastolic parameters are  consistent with Grade I diastolic  dysfunction (impaired relaxation).  2. Right ventricular systolic function is normal. The right ventricular  size is normal.  3. The mitral valve is normal in structure. Mild mitral valve  regurgitation. No evidence of mitral stenosis.  4. The aortic valve was not well visualized. Aortic valve regurgitation  is not visualized. No aortic stenosis is present.  5. The inferior vena cava is normal in size with <50% respiratory  variability, suggesting right atrial pressure of 8 mmHg.    EKG:  EKG is  ordered today.  The ekg ordered today demonstrates NSR, 76bpm, PVC, no ST/T wave changes  Recent Labs: 05/17/2021: ALT 29; TSH 0.84 05/24/2021: B Natriuretic Peptide 38.4; BUN 16; Creatinine, Ser 0.76; Hemoglobin 15.8; Magnesium 2.0; Platelets 275; Potassium 3.7; Sodium 139  Recent Lipid Panel    Component Value Date/Time   CHOL 227 (H) 05/17/2021 0802   TRIG 310 (H) 05/17/2021 0802   HDL 44 (L) 05/17/2021 0802   CHOLHDL 5.2 (H) 05/17/2021 0802   VLDL 57.6 (H) 06/22/2015 0746   LDLCALC 139 (H) 05/17/2021 0802   LDLDIRECT 96.0 06/22/2015 0746     Physical Exam:    VS:  LMP 03/22/2014     Wt Readings from Last 3 Encounters:  05/24/21 194 lb (88 kg)  05/16/21 194 lb 9.6 oz (88.3 kg)  02/09/21 191 lb (86.6 kg)     NAT:FTDD nourished, well developed in no acute distress HEENT: Normal NECK: No JVD; No carotid bruits LYMPHATICS: No lymphadenopathy CARDIAC: RRR, no murmurs, rubs,  gallops RESPIRATORY:  Clear to auscultation without rales, wheezing or rhonchi  ABDOMEN: Soft, non-tender, non-distended MUSCULOSKELETAL:  Trace lower leg edema; No deformity  SKIN: Warm and dry NEUROLOGIC:  Alert and oriented x 3 PSYCHIATRIC:  Normal affect   ASSESSMENT:    1. Paroxysmal atrial fibrillation (HCC)   2. Chronic anticoagulation   3. Hypokalemia   4. Essential hypertension    PLAN:    In order of problems listed above:  Paroxysmal Afib Patient was doing well with no known recurrence of afib until yesterday. She woke up with chest pressures, SOB, and palpitations and went into the ER and was found to be in Afib RVR. She converted to SR with IV diltiazem. Labs were unremarkable. She was sent home. Today she is  in SR. BP 126/74 and heart rate in the 70s. Afib diagnosis was discussed in depth. I will increase diltiazem to 360mg  daily and give short-acting diltiazem 30mg  to take as needed. She does snore at night so I will also refer for sleep study. I will refer to EP for further management of afib, she might require AAA since she is still young. Continue anticoagulation with Eliquis 5mg  BID.   Mild MR/HFpEF Trace edema on exam, suspect it's from afib RVR. Discussed lasix however will try lifestyle changes. Recommend low salt diet, compression socks, leg elevation. I will increase HCTZ to 25mg  daily. BMET in a week. She will take daily weights.   Hypokalemia Recent K+3.7. With increase in HCTZ to 25mg  daily, BMET in a week  HTN Increase diltiazem and HCTZ as above.   Disposition: Follow up in 3 month(s) with APP/MD    Signed, Klaira Pesci Ninfa Meeker, PA-C  05/25/2021 7:52 AM    [facility]

## 2021-05-30 ENCOUNTER — Telehealth: Payer: Self-pay | Admitting: Medical

## 2021-05-30 NOTE — Telephone Encounter (Signed)
Return Wanda Aguilar's phone call regarding sleep study. Mrs. Chestnutt reports someone called her Friday 6/3 in the afternoon about a sleep study, she had just seen Cadence Kathlen Mody, PA-C on 6/2.  Progress notes from 6/2 from Cadence is "deleted", however, skimming over notes, do not see where sleep study was address, also, no phone encounter in reference to a sleep study. AVS from 6/2 has no reference to sleep study as well.   Advised Mrs. Gilmer, will send Cadence a message in regards to sleep study and get back in touch with her. Mrs. Lizana agrees with plan, reports "no rush, just whenever a decision is made". Verbalized understanding  Advise will be in touch.

## 2021-05-30 NOTE — Telephone Encounter (Signed)
Patient is calling regarding her sleep study scheduling.

## 2021-06-01 ENCOUNTER — Ambulatory Visit: Payer: No Typology Code available for payment source | Admitting: Podiatry

## 2021-06-22 ENCOUNTER — Other Ambulatory Visit: Payer: Self-pay | Admitting: Internal Medicine

## 2021-06-22 DIAGNOSIS — I10 Essential (primary) hypertension: Secondary | ICD-10-CM

## 2021-06-22 DIAGNOSIS — Z0000 Encounter for general adult medical examination without abnormal findings: Secondary | ICD-10-CM

## 2021-07-12 ENCOUNTER — Other Ambulatory Visit: Payer: Self-pay | Admitting: Internal Medicine

## 2021-07-12 NOTE — Telephone Encounter (Signed)
Requested Prescriptions  Pending Prescriptions Disp Refills  . metFORMIN (GLUCOPHAGE) 500 MG tablet [Pharmacy Med Name: METFORMIN HCL 500 MG TABLET] 180 tablet 1    Sig: TAKE 1 TABLET BY MOUTH 2 TIMES DAILY WITH A MEAL.     Endocrinology:  Diabetes - Biguanides Passed - 07/12/2021  6:42 PM      Passed - Cr in normal range and within 360 days    Creat  Date Value Ref Range Status  05/17/2021 0.65 0.50 - 1.05 mg/dL Final    Comment:    For patients >25 years of age, the reference limit for Creatinine is approximately 13% higher for people identified as African-American. .    Creatinine, Ser  Date Value Ref Range Status  05/24/2021 0.76 0.44 - 1.00 mg/dL Final         Passed - HBA1C is between 0 and 7.9 and within 180 days    Hemoglobin A1C  Date Value Ref Range Status  05/16/2021 7.3 (A) 4.0 - 5.6 % Final   Hgb A1c MFr Bld  Date Value Ref Range Status  05/06/2020 6.0 (H) <5.7 % of total Hgb Final    Comment:    For someone without known diabetes, a hemoglobin  A1c value between 5.7% and 6.4% is consistent with prediabetes and should be confirmed with a  follow-up test. . For someone with known diabetes, a value <7% indicates that their diabetes is well controlled. A1c targets should be individualized based on duration of diabetes, age, comorbid conditions, and other considerations. . This assay result is consistent with an increased risk of diabetes. . Currently, no consensus exists regarding use of hemoglobin A1c for diagnosis of diabetes for children. .          Passed - eGFR in normal range and within 360 days    GFR calc Af Amer  Date Value Ref Range Status  02/09/2021 118 >59 mL/min/1.73 Final    Comment:    **In accordance with recommendations from the NKF-ASN Task force,**   [facility] is in the process of updating its eGFR calculation to the   2021 CKD-EPI creatinine equation that estimates kidney function   without a race variable.    GFR, Estimated   Date Value Ref Range Status  05/24/2021 >60 >60 mL/min Final    Comment:    (NOTE) Calculated using the CKD-EPI Creatinine Equation (2021)    GFR  Date Value Ref Range Status  06/22/2015 74.54 >60.00 mL/min Final         Passed - Valid encounter within last 6 months    Recent Outpatient Visits          1 month ago Encounter for general adult medical examination with abnormal findings   [facility] [place], Coralie Keens, NP      Future Appointments            In 2 weeks Furth, [employer], PA-C [place], [facility]   In 3 weeks Quentin Ore, Hilton Cork, MD [facility], [place]   In [place], Coralie Keens, NP [facility], [facility]

## 2021-07-18 ENCOUNTER — Institutional Professional Consult (permissible substitution): Payer: No Typology Code available for payment source | Admitting: Adult Health

## 2021-07-18 NOTE — Telephone Encounter (Addendum)
Spoke with patient and reviewed provider recommendations. She already has pulmonary referral in place and has appointment. She did inquire if she still needed to come in and see our provider since she will not have sleep study at that time. Advised that since we made some medication changes that she should keep appointment for assessment. She verbalized understanding and will keep appt for now. She verbalized understanding of our conversation with no further questions at this time.     Furth, Cadence H, PA-C  1 hour ago (8:53 AM)   Please refer to pulmonology for sleep study. Thanks

## 2021-07-26 NOTE — Progress Notes (Deleted)
Cardiology Office Note:    Date:  07/26/2021   ID:  Wanda Aguilar, DOB 30-Jul-1966, MRN [id number]  PCP:  Jearld Fenton, NP  [facility] Cardiologist:  Kate Sable, MD  [facility] Electrophysiologist:  None   Referring MD: Jearld Fenton, NP   Chief Complaint: 3 month follow-up  History of Present Illness:    Wanda Aguilar is a 55 y.o. female with a hx of ***  Past Medical History:  Diagnosis Date   Atrial fibrillation ([place])    Fibroids    Frequent headaches    Hypertension    Urine incontinence     Past Surgical History:  Procedure Laterality Date   ABDOMINAL HYSTERECTOMY Bilateral 04/13/2014   Procedure: HYSTERECTOMY ABDOMINAL WITH BILATERAL SALPINGO OOPHORECTOMY;  Surgeon: Darlyn Chamber, MD;  Location: [facility];  Service: Gynecology;  Laterality: Bilateral;   CARPAL TUNNEL RELEASE     Right Hand   Ceaserean     CESAREAN SECTION     GANGLION CYST EXCISION     Bilateral   TOOTH EXTRACTION      Current Medications: No outpatient medications have been marked as taking for the 07/27/21 encounter (Appointment) with Kathlen Mody, Kristen Fromm H, PA-C.     Allergies:   Patient has no known allergies.   Social History   Socioeconomic History   Marital status: Divorced    Spouse name: Not on file   Number of children: Not on file   Years of education: Not on file   Highest education level: Not on file  Occupational History   Not on file  Tobacco Use   Smoking status: Some Days    Packs/day: 0.30    Years: 4.00    Pack years: 1.20    Types: Cigarettes    Start date: 01/14/2009   Smokeless tobacco: Never  Vaping Use   Vaping Use: Never used  Substance and Sexual Activity   Alcohol use: Yes    Comment: occasional   Drug use: No   Sexual activity: Yes  Other Topics Concern   Not on file  Social History Narrative   Not on file   Social Determinants of Health   Financial Resource Strain: Not on file  Food Insecurity: Not on file  Transportation Needs:  Not on file  Physical Activity: Not on file  Stress: Not on file  Social Connections: Not on file     Family History: The patient's ***family history includes Alcohol abuse in her maternal grandfather; Arthritis in her maternal grandmother; Diabetes in her father, paternal aunt, paternal grandfather, paternal grandmother, and paternal uncle.  ROS:   Please see the history of present illness.    *** All other systems reviewed and are negative.  EKGs/Labs/Other Studies Reviewed:    The following studies were reviewed today: ***  EKG:  EKG is *** ordered today.  The ekg ordered today demonstrates ***  Recent Labs: 05/17/2021: ALT 29; TSH 0.84 05/24/2021: B Natriuretic Peptide 38.4; BUN 16; Creatinine, Ser 0.76; Hemoglobin 15.8; Magnesium 2.0; Platelets 275; Potassium 3.7; Sodium 139  Recent Lipid Panel    Component Value Date/Time   CHOL 227 (H) 05/17/2021 0802   TRIG 310 (H) 05/17/2021 0802   HDL 44 (L) 05/17/2021 0802   CHOLHDL 5.2 (H) 05/17/2021 0802   VLDL 57.6 (H) 06/22/2015 0746   LDLCALC 139 (H) 05/17/2021 0802   LDLDIRECT 96.0 06/22/2015 0746     Risk Assessment/Calculations:   {Does this patient have ATRIAL FIBRILLATION?[phone]}  Physical Exam:    VS:  LMP 03/22/2014     Wt Readings from Last 3 Encounters:  05/25/21 189 lb (85.7 kg)  05/24/21 194 lb (88 kg)  05/16/21 194 lb 9.6 oz (88.3 kg)     GEN: *** Well nourished, well developed in no acute distress HEENT: Normal NECK: No JVD; No carotid bruits LYMPHATICS: No lymphadenopathy CARDIAC: ***RRR, no murmurs, rubs, gallops RESPIRATORY:  Clear to auscultation without rales, wheezing or rhonchi  ABDOMEN: Soft, non-tender, non-distended MUSCULOSKELETAL:  No edema; No deformity  SKIN: Warm and dry NEUROLOGIC:  Alert and oriented x 3 PSYCHIATRIC:  Normal affect   ASSESSMENT:    No diagnosis found. PLAN:    In order of problems listed above:  ***  Disposition: Follow up {follow up:[id number]} with  ***   Shared Decision Making/Informed Consent   {Are you ordering a CV Procedure (e.g. stress test, cath, DCCV, TEE, etc)?   Press F2        :[id number]    Signed, Niamya Vittitow Ninfa Meeker, PA-C  07/26/2021 1:37 PM    [facility]

## 2021-07-27 ENCOUNTER — Ambulatory Visit: Payer: No Typology Code available for payment source | Admitting: Medical

## 2021-08-01 NOTE — Progress Notes (Signed)
Electrophysiology Office Note:    Date:  08/02/2021   ID:  Wanda Aguilar, DOB December 23, 1966, MRN [id number]  PCP:  Jearld Fenton, NP  [facility] Cardiologist:  Kate Sable, MD  [facility] Electrophysiologist:  Vickie Epley, MD   Referring MD: Antony Madura, PA-C   Chief Complaint: AF   History of Present Illness:    Wanda Aguilar is a 55 y.o. female who presents for an evaluation of AF at the request of Cadence Furth, PA-C. Their medical history includes headaches, HTN and fibroids. Her diagnosis of covid dates back to January 2022 when she had Covid. She presented most recently on 05/24/2021 to the ER with palpitations and chest discomfort. She takes eliquis for stroke prophylaxis.She was started on a diltiazem gtt and admitted to the hospital. Her HR was 160s on admission. The dilt gtt ultimately converted her back to sinus rhythm. Her most recent episode of atrial fibrillation occurred yesterday.  She tells me that she snores loudly at night.  Her husband also tells her that she stops breathing/obstructs at night.  She has a sleep study scheduled.   Past Medical History:  Diagnosis Date   Atrial fibrillation ([place])    Fibroids    Frequent headaches    Hypertension    Urine incontinence     Past Surgical History:  Procedure Laterality Date   ABDOMINAL HYSTERECTOMY Bilateral 04/13/2014   Procedure: HYSTERECTOMY ABDOMINAL WITH BILATERAL SALPINGO OOPHORECTOMY;  Surgeon: Darlyn Chamber, MD;  Location: [facility];  Service: Gynecology;  Laterality: Bilateral;   CARPAL TUNNEL RELEASE     Right Hand   Ceaserean     CESAREAN SECTION     GANGLION CYST EXCISION     Bilateral   TOOTH EXTRACTION      Current Medications: Current Meds  Medication Sig   apixaban (ELIQUIS) 5 MG TABS tablet Take 1 tablet (5 mg total) by mouth 2 (two) times daily.   diclofenac (VOLTAREN) 50 MG EC tablet Take 50 mg by mouth as needed.   diltiazem (CARDIZEM) 30 MG tablet Take 1 tablet  by mouth up to 3X a day as needed for a heart rate of 110 or greater.   diltiazem (TIAZAC) 360 MG 24 hr capsule Take 1 capsule (360 mg total) by mouth daily.   FLUoxetine (PROZAC) 10 MG capsule Take 1 capsule (10 mg total) by mouth daily.   gabapentin (NEURONTIN) 100 MG capsule Take 100 mg by mouth at bedtime.   hydrochlorothiazide (HYDRODIURIL) 25 MG tablet Take 1 tablet (25 mg total) by mouth daily.   losartan (COZAAR) 100 MG tablet TAKE 1 TABLET BY MOUTH EVERY DAY   metFORMIN (GLUCOPHAGE) 500 MG tablet TAKE 1 TABLET BY MOUTH 2 TIMES DAILY WITH A MEAL.   [DISCONTINUED] albuterol (VENTOLIN HFA) 108 (90 Base) MCG/ACT inhaler albuterol sulfate HFA 90 mcg/actuation aerosol inhaler   [DISCONTINUED] hydrochlorothiazide (MICROZIDE) 12.5 MG capsule TAKE 1 CAPSULE BY MOUTH EVERY DAY     Allergies:   Patient has no known allergies.   Social History   Socioeconomic History   Marital status: Divorced    Spouse name: Not on file   Number of children: Not on file   Years of education: Not on file   Highest education level: Not on file  Occupational History   Not on file  Tobacco Use   Smoking status: Some Days    Packs/day: 0.30    Years: 4.00    Pack years: 1.20  Types: Cigarettes    Start date: 01/14/2009   Smokeless tobacco: Never  Vaping Use   Vaping Use: Never used  Substance and Sexual Activity   Alcohol use: Yes    Comment: occasional   Drug use: No   Sexual activity: Yes  Other Topics Concern   Not on file  Social History Narrative   Not on file   Social Determinants of Health   Financial Resource Strain: Not on file  Food Insecurity: Not on file  Transportation Needs: Not on file  Physical Activity: Not on file  Stress: Not on file  Social Connections: Not on file     Family History: The patient's family history includes Alcohol abuse in her maternal grandfather; Arthritis in her maternal grandmother; Diabetes in her father, paternal aunt, paternal grandfather,  paternal grandmother, and paternal uncle.  ROS:   Please see the history of present illness.    All other systems reviewed and are negative.  EKGs/Labs/Other Studies Reviewed:    The following studies were reviewed today: ER records  03/03/2021 [facility] personally reviewed HR 40-255, average 76 7 episodes of SVT, longest lasting 10 beats Rare supraventricular and ventricular ectopy   02/07/2021 echo personally reviewed EF 60% RV normal Mild MR No other significant valvular abnormalities  EKG:  The ekg ordered today demonstrates sinus rhythm with occasional PVC.  Recent Labs: 05/17/2021: ALT 29; TSH 0.84 05/24/2021: B Natriuretic Peptide 38.4; BUN 16; Creatinine, Ser 0.76; Hemoglobin 15.8; Magnesium 2.0; Platelets 275; Potassium 3.7; Sodium 139  Recent Lipid Panel    Component Value Date/Time   CHOL 227 (H) 05/17/2021 0802   TRIG 310 (H) 05/17/2021 0802   HDL 44 (L) 05/17/2021 0802   CHOLHDL 5.2 (H) 05/17/2021 0802   VLDL 57.6 (H) 06/22/2015 0746   LDLCALC 139 (H) 05/17/2021 0802   LDLDIRECT 96.0 06/22/2015 0746    Physical Exam:    VS:  BP (!) 152/92 (BP Location: Left Arm, Patient Position: Sitting, Cuff Size: Normal)   Pulse 61   Ht '5\' 3"'$  (1.6 m)   Wt 196 lb (88.9 kg)   LMP 03/22/2014   SpO2 97%   BMI 34.72 kg/m     Wt Readings from Last 3 Encounters:  08/02/21 196 lb (88.9 kg)  05/25/21 189 lb (85.7 kg)  05/24/21 194 lb (88 kg)     GEN:  Well nourished, well developed in no acute distress HEENT: Normal NECK: No JVD; No carotid bruits LYMPHATICS: No lymphadenopathy CARDIAC: RRR, no murmurs, rubs, gallops RESPIRATORY:  Clear to auscultation without rales, wheezing or rhonchi  ABDOMEN: Soft, non-tender, non-distended MUSCULOSKELETAL:  No edema; No deformity  SKIN: Warm and dry NEUROLOGIC:  Alert and oriented x 3 PSYCHIATRIC:  Normal affect   ASSESSMENT:    1. Paroxysmal atrial fibrillation (HCC)   2. Essential hypertension   3. Snoring   4. Obesity (BMI  30.0-34.9)    PLAN:    In order of problems listed above:  1. Paroxysmal atrial fibrillation [facility]) The patient has symptomatic paroxysmal atrial fibrillation.  We discussed management options for her atrial fibrillation during today's clinic appointment including continued rate control and stroke prophylaxis, rhythm control using antiarrhythmic drugs and rhythm control using ablation.  Overall I think she is a good candidate for ablation.  We discussed the ablation procedure in great detail during today's clinic appointment including the risks, recovery time and need for ongoing anticoagulation.  She wants to talk to her husband procedure and she will let us know  if she would like to schedule.  Risk, benefits, and alternatives to EP study and radiofrequency ablation for afib were also discussed in detail today. These risks include but are not limited to stroke, bleeding, vascular damage, tamponade, perforation, damage to the esophagus, lungs, and other structures, pulmonary vein stenosis, worsening renal function, and death. The patient understands these risks.  She will let us know if she likes to schedule.  If so, Carto, ICE, anesthesia are requested for the procedure.  Will also obtain CT PV protocol prior to the procedure to exclude LAA thrombus and further evaluate atrial anatomy.   2. Essential hypertension Slightly above goal today.  Continue her current regimen.  Encouraged her to check her blood pressure several times a week at home.  Sleep study as below.   3. Snoring Patient has a sleep study scheduled.  I have encouraged her to keep this appointment and discussed the link between sleep apnea and atrial fibrillation during today's clinic appointment.  4. Obesity (BMI 30.0-34.9) We discussed the link between obesity and atrial fibrillation during today's clinic appointment.  Weight loss encouraged.        Total time spent with patient today 65 minutes. This includes reviewing  records, evaluating the patient and coordinating care.  Medication Adjustments/Labs and Tests Ordered: Current medicines are reviewed at length with the patient today.  Concerns regarding medicines are outlined above.  Orders Placed This Encounter  Procedures   EKG 12-Lead   Meds ordered this encounter  Medications   albuterol (VENTOLIN HFA) 108 (90 Base) MCG/ACT inhaler    Sig: Inhale 1 puff into the lungs every 6 (six) hours as needed for wheezing or shortness of breath.    Dispense:  1 each    Refill:  0     Signed, Lysbeth Galas T. Quentin Ore, MD, [facility], [facility] 08/02/2021 7:08 PM    Electrophysiology [facility]

## 2021-08-02 ENCOUNTER — Ambulatory Visit: Payer: No Typology Code available for payment source | Admitting: Cardiology

## 2021-08-02 ENCOUNTER — Encounter: Payer: Self-pay | Admitting: Cardiology

## 2021-08-02 ENCOUNTER — Other Ambulatory Visit: Payer: Self-pay

## 2021-08-02 VITALS — BP 152/92 | HR 61 | Ht 63.0 in | Wt 196.0 lb

## 2021-08-02 DIAGNOSIS — I480 Paroxysmal atrial fibrillation: Secondary | ICD-10-CM | POA: Diagnosis not present

## 2021-08-02 DIAGNOSIS — I10 Essential (primary) hypertension: Secondary | ICD-10-CM | POA: Diagnosis not present

## 2021-08-02 DIAGNOSIS — E669 Obesity, unspecified: Secondary | ICD-10-CM

## 2021-08-02 DIAGNOSIS — R0683 Snoring: Secondary | ICD-10-CM | POA: Diagnosis not present

## 2021-08-02 MED ORDER — ALBUTEROL SULFATE HFA 108 (90 BASE) MCG/ACT IN AERS: 1.0000 | INHALATION_SPRAY | Freq: Four times a day (QID) | RESPIRATORY_TRACT | 0 refills | Status: DC | PRN

## 2021-08-02 NOTE — Patient Instructions (Signed)
Medication Instructions:  Your physician recommends that you continue on your current medications as directed. Please refer to the Current Medication list given to you today.   *If you need a refill on your cardiac medications before your next appointment, please call your pharmacy*   Lab Work: None ordered.  If you have labs (blood work) drawn today and your tests are completely normal, you will receive your results only by: [place] (if you have MyChart) OR A paper copy in the mail If you have any lab test that is abnormal or we need to change your treatment, we will call you to review the results.   Testing/Procedures: None ordered.    Follow-Up: At [facility], you and your health needs are our priority.  As part of our continuing mission to provide you with exceptional heart care, we have created designated Provider Care Teams.  These Care Teams include your primary Cardiologist (physician) and Advanced Practice Providers (APPs -  Physician Assistants and Nurse Practitioners) who all work together to provide you with the care you need, when you need it.  We recommend signing up for the patient portal called "MyChart".  Sign up information is provided on this After Visit Summary.  MyChart is used to connect with patients for Virtual Visits (Telemedicine).  Patients are able to view lab/test results, encounter notes, upcoming appointments, etc.  Non-urgent messages can be sent to your provider as well.   To learn more about what you can do with MyChart, go to [email or web address].    Your next appointment:   3 month(s)  The format for your next appointment:   In Person  Provider:   Lars Mage, MD   Other Instructions Please call Cheral Almas for questions or to schedule AFIB ablation at [phone]

## 2021-08-05 ENCOUNTER — Telehealth: Payer: No Typology Code available for payment source | Admitting: Family

## 2021-08-05 DIAGNOSIS — H109 Unspecified conjunctivitis: Secondary | ICD-10-CM | POA: Diagnosis not present

## 2021-08-06 MED ORDER — POLYMYXIN B-TRIMETHOPRIM 10000-0.1 UNIT/ML-% OP SOLN
1.0000 [drp] | Freq: Four times a day (QID) | OPHTHALMIC | 0 refills | Status: DC
Start: 2021-08-06 — End: 2021-09-05

## 2021-08-06 NOTE — Progress Notes (Signed)

## 2021-08-17 ENCOUNTER — Telehealth: Payer: Self-pay

## 2021-08-17 NOTE — Telephone Encounter (Signed)
Noted.  Will close encounter.  

## 2021-08-17 NOTE — Telephone Encounter (Signed)
Called and left voice message in regards to upcoming appointment, I would like to know if patient has ever had a sleep study before?  If not nothing further is needed.

## 2021-08-18 ENCOUNTER — Other Ambulatory Visit: Payer: Self-pay

## 2021-08-18 ENCOUNTER — Ambulatory Visit: Payer: No Typology Code available for payment source | Admitting: Primary Care

## 2021-08-18 ENCOUNTER — Encounter: Payer: Self-pay | Admitting: Primary Care

## 2021-08-18 VITALS — BP 130/80 | HR 69 | Temp 98.0°F | Ht 63.0 in | Wt 195.4 lb

## 2021-08-18 DIAGNOSIS — R0683 Snoring: Secondary | ICD-10-CM

## 2021-08-18 NOTE — Progress Notes (Signed)
$'@Patient'S$  ID: Wanda Aguilar, female    DOB: [birth year], 55 y.o.   MRN: [id number]  No chief complaint on file.   Referring provider: Antony Madura PA-C  HPI: 55 year old female. Former smoker quit in December 2021. PMH significant for afib, type 2 diabetes, HTN,   08/18/2021  Patient presents today for sleep consult, referred by cardiology. She reports symptoms of snoring, choking at night when lying on her back, restless sleep and daytime fatigue for three years. She has never had a sleep study. No known family history of sleep apnea. She gets on average 7 hours of sleep a night. Typical bedtime is between10-10:30p. She occasionally has a hard time falling asleep; on average it takes her 30 mins but can take anywhere from 1-2 hours. She wakes up 3 times a night. She starts her day at 5:45 in the morning. 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 She takes a 30 min nap most days. Her weight has increased 40 lbs in the last 2 years. She attributes this to menopause. She was started on gabapentin at bedtime for night sweats by her GYN. Denies narcolepsy, cataplexy or sleep walking.     No Known Allergies   There is no immunization history on file for this patient.  Past Medical History:  Diagnosis Date   Atrial fibrillation ([place])    Fibroids    Frequent headaches    Hypertension    Urine incontinence     Tobacco History: Social History   Tobacco Use  Smoking Status Former   Packs/day: 0.30   Years: 4.00   Pack years: 1.20   Types: Cigarettes   Start date: 01/14/2009   Quit date: 12/21/2020   Years since quitting: 0.6  Smokeless Tobacco Never   Counseling given: Not Answered   Outpatient Medications Prior to Visit  Medication Sig Dispense Refill   albuterol (VENTOLIN HFA) 108 (90 Base) MCG/ACT inhaler Inhale 1 puff into the lungs every 6 (six) hours as needed for wheezing or shortness of breath. 1 each 0   apixaban (ELIQUIS) 5 MG TABS tablet  Take 1 tablet (5 mg total) by mouth 2 (two) times daily. 60 tablet 11   diclofenac (VOLTAREN) 50 MG EC tablet Take 50 mg by mouth as needed.     diltiazem (CARDIZEM) 30 MG tablet Take 1 tablet by mouth up to 3X a day as needed for a heart rate of 110 or greater. 30 tablet 5   diltiazem (TIAZAC) 360 MG 24 hr capsule Take 1 capsule (360 mg total) by mouth daily. 30 capsule 5   FLUoxetine (PROZAC) 10 MG capsule Take 1 capsule (10 mg total) by mouth daily. 30 capsule 2   gabapentin (NEURONTIN) 100 MG capsule Take 100 mg by mouth at bedtime.     hydrochlorothiazide (HYDRODIURIL) 25 MG tablet Take 1 tablet (25 mg total) by mouth daily. 30 tablet 6   losartan (COZAAR) 100 MG tablet TAKE 1 TABLET BY MOUTH EVERY DAY 90 tablet 0   metFORMIN (GLUCOPHAGE) 500 MG tablet TAKE 1 TABLET BY MOUTH 2 TIMES DAILY WITH A MEAL. 180 tablet 1   trimethoprim-polymyxin b (POLYTRIM) ophthalmic solution Place 1 drop into the left eye every 6 (six) hours. 10 mL 0   No facility-administered medications prior to visit.   Review of Systems  Review of Systems  Constitutional:  Positive for fatigue.  HENT: Negative.    Respiratory: Negative.    Psychiatric/Behavioral:  Positive for sleep  disturbance.     Physical Exam  BP 130/80 (BP Location: Left Arm, Cuff Size: Normal)   Pulse 69   Temp 98 F (36.7 C) (Oral)   Ht '5\' 3"'$  (1.6 m)   Wt 195 lb 6.4 oz (88.6 kg)   LMP 03/22/2014   SpO2 99%   BMI 34.61 kg/m  Physical Exam Constitutional:      Appearance: Normal appearance.  HENT:     Head: Normocephalic and atraumatic.     Mouth/Throat:     Comments: Deferred d/t mask Cardiovascular:     Rate and Rhythm: Normal rate.  Pulmonary:     Effort: Pulmonary effort is normal.  Skin:    General: Skin is warm and dry.  Neurological:     General: No focal deficit present.     Mental Status: She is alert and oriented to person, place, and time. Mental status is at baseline.  Psychiatric:        Mood and Affect: Mood  normal.        Behavior: Behavior normal.        Thought Content: Thought content normal.        Judgment: Judgment normal.     Lab Results:  CBC    Component Value Date/Time   WBC 7.5 05/24/2021 0517   RBC 4.95 05/24/2021 0517   HGB 15.8 (H) 05/24/2021 0517   HGB 14.7 02/09/2021 0917   HCT 43.9 05/24/2021 0517   HCT 42.0 02/09/2021 0917   PLT 275 05/24/2021 0517   PLT 275 02/09/2021 0917   MCV 88.7 05/24/2021 0517   MCV 92 02/09/2021 0917   MCH 31.9 05/24/2021 0517   MCHC 36.0 05/24/2021 0517   RDW 11.9 05/24/2021 0517   RDW 11.9 02/09/2021 0917   LYMPHSABS 2.1 01/22/2021 0339   MONOABS 0.6 01/22/2021 0339   EOSABS 0.1 01/22/2021 0339   BASOSABS 0.1 01/22/2021 0339    BMET    Component Value Date/Time   NA 139 05/24/2021 0517   NA 141 02/09/2021 0917   K 3.7 05/24/2021 0517   CL 102 05/24/2021 0517   CO2 24 05/24/2021 0517   GLUCOSE 189 (H) 05/24/2021 0517   BUN 16 05/24/2021 0517   BUN 15 02/09/2021 0917   CREATININE 0.76 05/24/2021 0517   CREATININE 0.65 05/17/2021 0802   CALCIUM 10.3 05/24/2021 0517   GFRNONAA >60 05/24/2021 0517   GFRAA 118 02/09/2021 0917    BNP    Component Value Date/Time   BNP 38.4 05/24/2021 0517    ProBNP No results found for: PROBNP  Imaging: No results found.   Assessment & Plan:   Snoring - Patient has symptoms of loud snoring, choking at night, restless sleep and daytime sleepiness. These symptoms have been present for 3 years. She has gained 40 lbs. Recommend we obtain a home sleep study to reevaluate for underlying sleep study. We discussed how untreated sleep apnea puts an individual at risk for cardiac arrhthymias, pulm HTN, DM, stroke and increases their risk for daytime accidents. We also briefly reviewed treatment options including weight loss, side sleeping position, oral appliance, CPAP therapy or referral to ENT for possible surgical options. She will follow-up with our office in 4-6 weeks to review sleep study  results and treatment options in greater detail. Advised against driving if excessively tired or fatigued and continue to encourage weight loss efforts.    Martyn Ehrich, NP 08/21/2021

## 2021-08-18 NOTE — Patient Instructions (Addendum)
Nice to meet you Wanda Aguilar  Recommendations: Focus on side sleeping position or use wedge pillow Do not drive if excessively tired Continue working on weight loss efforts  Orders: Home sleep study re: loud snoring   Follow-up: 4-6 week televisit with Eustaquio Maize NP to discuss results

## 2021-08-21 DIAGNOSIS — R0683 Snoring: Secondary | ICD-10-CM | POA: Insufficient documentation

## 2021-08-21 NOTE — Assessment & Plan Note (Signed)
-   Patient has symptoms of loud snoring, choking at night, restless sleep and daytime sleepiness. These symptoms have been present for 3 years. She has gained 40 lbs. Recommend we obtain a home sleep study to reevaluate for underlying sleep study. We discussed how untreated sleep apnea puts an individual at risk for cardiac arrhthymias, pulm HTN, DM, stroke and increases their risk for daytime accidents. We also briefly reviewed treatment options including weight loss, side sleeping position, oral appliance, CPAP therapy or referral to ENT for possible surgical options. She will follow-up with our office in 4-6 weeks to review sleep study results and treatment options in greater detail. Advised against driving if excessively tired or fatigued and continue to encourage weight loss efforts.

## 2021-08-22 ENCOUNTER — Ambulatory Visit: Payer: Self-pay | Admitting: Internal Medicine

## 2021-08-31 ENCOUNTER — Ambulatory Visit: Payer: No Typology Code available for payment source | Admitting: Medical

## 2021-09-01 ENCOUNTER — Other Ambulatory Visit: Payer: Self-pay | Admitting: Internal Medicine

## 2021-09-02 ENCOUNTER — Other Ambulatory Visit: Payer: Self-pay | Admitting: Cardiology

## 2021-09-05 ENCOUNTER — Other Ambulatory Visit: Payer: Self-pay

## 2021-09-05 ENCOUNTER — Ambulatory Visit: Payer: No Typology Code available for payment source | Admitting: Internal Medicine

## 2021-09-05 ENCOUNTER — Encounter: Payer: Self-pay | Admitting: Internal Medicine

## 2021-09-05 VITALS — BP 141/82 | HR 61 | Temp 97.7°F | Resp 18 | Ht 63.0 in | Wt 197.4 lb

## 2021-09-05 DIAGNOSIS — I10 Essential (primary) hypertension: Secondary | ICD-10-CM | POA: Diagnosis not present

## 2021-09-05 DIAGNOSIS — Z6834 Body mass index (BMI) 34.0-34.9, adult: Secondary | ICD-10-CM

## 2021-09-05 DIAGNOSIS — K219 Gastro-esophageal reflux disease without esophagitis: Secondary | ICD-10-CM | POA: Diagnosis not present

## 2021-09-05 DIAGNOSIS — E782 Mixed hyperlipidemia: Secondary | ICD-10-CM

## 2021-09-05 DIAGNOSIS — E119 Type 2 diabetes mellitus without complications: Secondary | ICD-10-CM | POA: Diagnosis not present

## 2021-09-05 DIAGNOSIS — E6609 Other obesity due to excess calories: Secondary | ICD-10-CM

## 2021-09-05 MED ORDER — OMEPRAZOLE 20 MG PO CPDR: 20.0000 mg | DELAYED_RELEASE_CAPSULE | Freq: Every day | ORAL | 0 refills | Status: DC

## 2021-09-05 NOTE — Progress Notes (Signed)
Subjective:    Patient ID: Wanda Aguilar, female    DOB: 01/05/1966, 55 y.o.   MRN: [id number]  HPI  Patient presents the clinic today for 31-monthfollow-up of HTN, HLD and DM2.  HTN: Her BP today is 141/82.  She is taking Diltiazem, Losartan and HCTZ as prescribed.  ECG from 07/2021 reviewed.  HLD: Her last LDL was 139, triglycerides 310, 04/2021.  She is not taking cholesterol lowering medication.  She has been trying to consume a low-fat diet.  DM2: Her last A1c was 7.3%, 04/2021.  She is taking Metformin as prescribed  She has not been checking her sugars.  She does not check her feet routinely.  Her last eye exam was more than 1 year ago.  Flu never.  Pneumovax never.  COVID never.  She also reports reflux.  She is not sure what is causing this.  She thought it might have been carbonated soda so she cut back on this but her symptoms still persist.  She is taking Famotidine OTC with minimal relief of symptoms.  Review of Systems     Past Medical History:  Diagnosis Date   Atrial fibrillation (HCC)    Fibroids    Frequent headaches    Hypertension    Urine incontinence     Current Outpatient Medications  Medication Sig Dispense Refill   albuterol (VENTOLIN HFA) 108 (90 Base) MCG/ACT inhaler Inhale 1 puff into the lungs every 6 (six) hours as needed for wheezing or shortness of breath. 1 each 0   apixaban (ELIQUIS) 5 MG TABS tablet Take 1 tablet (5 mg total) by mouth 2 (two) times daily. 60 tablet 11   diclofenac (VOLTAREN) 50 MG EC tablet Take 50 mg by mouth as needed.     diltiazem (CARDIZEM) 30 MG tablet Take 1 tablet by mouth up to 3X a day as needed for a heart rate of 110 or greater. 30 tablet 5   diltiazem (TIAZAC) 360 MG 24 hr capsule Take 1 capsule (360 mg total) by mouth daily. 30 capsule 5   FLUoxetine (PROZAC) 10 MG capsule Take 1 capsule (10 mg total) by mouth daily. 90 capsule 0   gabapentin (NEURONTIN) 100 MG capsule Take 100 mg by mouth at bedtime.      hydrochlorothiazide (HYDRODIURIL) 25 MG tablet Take 1 tablet (25 mg total) by mouth daily. 30 tablet 6   losartan (COZAAR) 100 MG tablet TAKE 1 TABLET BY MOUTH EVERY DAY 90 tablet 0   metFORMIN (GLUCOPHAGE) 500 MG tablet TAKE 1 TABLET BY MOUTH 2 TIMES DAILY WITH A MEAL. 180 tablet 1   trimethoprim-polymyxin b (POLYTRIM) ophthalmic solution Place 1 drop into the left eye every 6 (six) hours. 10 mL 0   No current facility-administered medications for this visit.    No Known Allergies  Family History  Problem Relation Age of Onset   Diabetes Father    Diabetes Paternal Aunt    Diabetes Paternal Uncle    Arthritis Maternal Grandmother    Alcohol abuse Maternal Grandfather    Diabetes Paternal Grandmother    Diabetes Paternal Grandfather     Social History   Socioeconomic History   Marital status: Married    Spouse name: Not on file   Number of children: Not on file   Years of education: Not on file   Highest education level: Not on file  Occupational History   Not on file  Tobacco Use   Smoking status: Former  Packs/day: 0.30    Years: 4.00    Pack years: 1.20    Types: Cigarettes    Start date: 01/14/2009    Quit date: 12/21/2020    Years since quitting: 0.7   Smokeless tobacco: Never  Vaping Use   Vaping Use: Never used  Substance and Sexual Activity   Alcohol use: Yes    Comment: occasional   Drug use: No   Sexual activity: Yes  Other Topics Concern   Not on file  Social History Narrative   Not on file   Social Determinants of Health   Financial Resource Strain: Not on file  Food Insecurity: Not on file  Transportation Needs: Not on file  Physical Activity: Not on file  Stress: Not on file  Social Connections: Not on file  Intimate Partner Violence: Not on file     Constitutional: Denies fever, malaise, fatigue, headache or abrupt weight changes.  Respiratory: Denies difficulty breathing, shortness of breath, cough or sputum production.    Cardiovascular: Denies chest pain, chest tightness, palpitations or swelling in the hands or feet.  Gastrointestinal: Patient reports reflux.  Denies abdominal pain, bloating, constipation, diarrhea or blood in the stool.  GU: Denies urgency, frequency, pain with urination, burning sensation, blood in urine, odor or discharge. Skin: Denies redness, rashes, lesions or ulcercations.  Neurological: Denies numbness, tingling or problems with balance and coordination.    No other specific complaints in a complete review of systems (except as listed in HPI above).  Objective:   Physical Exam   BP (!) 141/82 (BP Location: Right Arm, Patient Position: Sitting, Cuff Size: Large)   Pulse 61   Temp 97.7 F (36.5 C) (Temporal)   Resp 18   Ht '5\' 3"'$  (1.6 m)   Wt 197 lb 6.4 oz (89.5 kg)   LMP 03/22/2014   SpO2 100%   BMI 34.97 kg/m   Wt Readings from Last 3 Encounters:  08/18/21 195 lb 6.4 oz (88.6 kg)  08/02/21 196 lb (88.9 kg)  05/25/21 189 lb (85.7 kg)    General: Appears  her stated age, obese in NAD. Skin: Warm, dry and intact. No ulcerations noted. HEENT: Head: normal shape and size; Eyes: sclera white and EOMs intact;  Cardiovascular: Normal rate and rhythm. S1,S2 noted.  No murmur, rubs or gallops noted. No JVD or BLE edema. No carotid bruits noted. Pulmonary/Chest: Normal effort and positive vesicular breath sounds. No respiratory distress. No wheezes, rales or ronchi noted.  Musculoskeletal: No difficulty with gait.  Neurological: Alert and oriented.   BMET    Component Value Date/Time   NA 139 05/24/2021 0517   NA 141 02/09/2021 0917   K 3.7 05/24/2021 0517   CL 102 05/24/2021 0517   CO2 24 05/24/2021 0517   GLUCOSE 189 (H) 05/24/2021 0517   BUN 16 05/24/2021 0517   BUN 15 02/09/2021 0917   CREATININE 0.76 05/24/2021 0517   CREATININE 0.65 05/17/2021 0802   CALCIUM 10.3 05/24/2021 0517   GFRNONAA >60 05/24/2021 0517   GFRAA 118 02/09/2021 0917    Lipid Panel      Component Value Date/Time   CHOL 227 (H) 05/17/2021 0802   TRIG 310 (H) 05/17/2021 0802   HDL 44 (L) 05/17/2021 0802   CHOLHDL 5.2 (H) 05/17/2021 0802   VLDL 57.6 (H) 06/22/2015 0746   LDLCALC 139 (H) 05/17/2021 0802    CBC    Component Value Date/Time   WBC 7.5 05/24/2021 0517   RBC 4.95 05/24/2021 0517  HGB 15.8 (H) 05/24/2021 0517   HGB 14.7 02/09/2021 0917   HCT 43.9 05/24/2021 0517   HCT 42.0 02/09/2021 0917   PLT 275 05/24/2021 0517   PLT 275 02/09/2021 0917   MCV 88.7 05/24/2021 0517   MCV 92 02/09/2021 0917   MCH 31.9 05/24/2021 0517   MCHC 36.0 05/24/2021 0517   RDW 11.9 05/24/2021 0517   RDW 11.9 02/09/2021 0917   LYMPHSABS 2.1 01/22/2021 0339   MONOABS 0.6 01/22/2021 0339   EOSABS 0.1 01/22/2021 0339   BASOSABS 0.1 01/22/2021 0339    Hgb A1C Lab Results  Component Value Date   HGBA1C 7.3 (A) 05/16/2021           Assessment & Plan:   Webb Silversmith, NP This visit occurred during the SARS-CoV-2 public health emergency.  Safety protocols were in place, including screening questions prior to the visit, additional usage of staff PPE, and extensive cleaning of exam room while observing appropriate contact time as indicated for disinfecting solutions.

## 2021-09-05 NOTE — Patient Instructions (Signed)

## 2021-09-06 ENCOUNTER — Encounter: Payer: Self-pay | Admitting: Internal Medicine

## 2021-09-06 DIAGNOSIS — E66811 Obesity, class 1: Secondary | ICD-10-CM | POA: Insufficient documentation

## 2021-09-06 DIAGNOSIS — K219 Gastro-esophageal reflux disease without esophagitis: Secondary | ICD-10-CM | POA: Insufficient documentation

## 2021-09-06 DIAGNOSIS — E66812 Obesity, class 2: Secondary | ICD-10-CM | POA: Insufficient documentation

## 2021-09-06 DIAGNOSIS — E6609 Other obesity due to excess calories: Secondary | ICD-10-CM | POA: Insufficient documentation

## 2021-09-06 DIAGNOSIS — Z6834 Body mass index (BMI) 34.0-34.9, adult: Secondary | ICD-10-CM | POA: Insufficient documentation

## 2021-09-06 LAB — COMPLETE METABOLIC PANEL WITH GFR
AG Ratio: 2 (calc) (ref 1.0–2.5)
ALT: 35 U/L — ABNORMAL HIGH (ref 6–29)
AST: 23 U/L (ref 10–35)
Albumin: 4.9 g/dL (ref 3.6–5.1)
Alkaline phosphatase (APISO): 81 U/L (ref 37–153)
BUN: 19 mg/dL (ref 7–25)
CO2: 29 mmol/L (ref 20–32)
Calcium: 10.1 mg/dL (ref 8.6–10.4)
Chloride: 100 mmol/L (ref 98–110)
Creat: 0.68 mg/dL (ref 0.50–1.03)
Globulin: 2.5 g/dL (calc) (ref 1.9–3.7)
Glucose, Bld: 115 mg/dL (ref 65–139)
Potassium: 4.7 mmol/L (ref 3.5–5.3)
Sodium: 140 mmol/L (ref 135–146)
Total Bilirubin: 0.4 mg/dL (ref 0.2–1.2)
Total Protein: 7.4 g/dL (ref 6.1–8.1)
eGFR: 103 mL/min/{1.73_m2} (ref >=60)

## 2021-09-06 LAB — LIPID PANEL
Cholesterol: 236 mg/dL — ABNORMAL HIGH (ref <200)
HDL: 44 mg/dL — ABNORMAL LOW (ref >=50)
Non-HDL Cholesterol (Calc): 192 mg/dL (calc) — ABNORMAL HIGH (ref <130)
Total CHOL/HDL Ratio: 5.4 (calc) — ABNORMAL HIGH (ref <5.0)
Triglycerides: 466 mg/dL — ABNORMAL HIGH (ref <150)

## 2021-09-06 LAB — HEMOGLOBIN A1C
Hgb A1c MFr Bld: 7.3 % of total Hgb — ABNORMAL HIGH (ref <5.7)
Mean Plasma Glucose: 163 mg/dL
eAG (mmol/L): 9 mmol/L

## 2021-09-06 NOTE — Assessment & Plan Note (Signed)
Encourage diet and exercise weight loss 

## 2021-09-06 NOTE — Assessment & Plan Note (Signed)
Advised her to try to identify foods that trigger her reflux and avoid them Encouraged weight loss as this can help reduce reflux symptoms Will start Omeprazole 20 mg daily Okay to continue Famotidine for breakthrough

## 2021-09-06 NOTE — Assessment & Plan Note (Signed)
C-Met and lipid profile today Encouraged her to consume a low-fat diet Consider statin therapy if LDL greater than 100

## 2021-09-06 NOTE — Assessment & Plan Note (Signed)
A1c today Encouraged her to consume a low-carb diet and exercise for weight loss Continue Metformin Encouraged her to schedule an appointment for an eye exam Encourage routine foot exams She declines immunizations

## 2021-09-06 NOTE — Assessment & Plan Note (Signed)
Controlled on Diltiazem, Losartan HCTZ C-Met today Reinforced DASH diet and exercise weight loss

## 2021-09-08 MED ORDER — INSULIN PEN NEEDLE 31G X 5 MM MISC: 1 refills | Status: DC

## 2021-09-08 MED ORDER — OZEMPIC (0.25 OR 0.5 MG/DOSE) 2 MG/1.5ML ~~LOC~~ SOPN: 0.2500 mg | PEN_INJECTOR | SUBCUTANEOUS | 3 refills | Status: DC

## 2021-09-21 ENCOUNTER — Telehealth: Payer: Self-pay | Admitting: Podiatry

## 2021-09-21 NOTE — Telephone Encounter (Signed)
Patient is currently on blood thinners but would like to know what the process is when she comes in to get an ingrown toenail.  Please advise

## 2021-09-22 ENCOUNTER — Telehealth: Payer: Self-pay | Admitting: *Deleted

## 2021-09-22 NOTE — Telephone Encounter (Signed)
Please advise 

## 2021-09-22 NOTE — Telephone Encounter (Signed)
Called and gave recommendations for blood thinners before upcoming visit for possible ingrown. The patient has been scheduled/confirmed.

## 2021-09-29 ENCOUNTER — Telehealth: Payer: Self-pay | Admitting: *Deleted

## 2021-09-29 NOTE — Telephone Encounter (Signed)
Returned call to patient to give information,no answer, left vmessage.

## 2021-09-29 NOTE — Telephone Encounter (Signed)
No problem being on blood thinner for ingrown procedure

## 2021-10-01 ENCOUNTER — Other Ambulatory Visit: Payer: Self-pay | Admitting: Internal Medicine

## 2021-10-01 NOTE — Telephone Encounter (Signed)
last RF 09/05/21 #90

## 2021-10-03 ENCOUNTER — Ambulatory Visit: Payer: No Typology Code available for payment source

## 2021-10-03 ENCOUNTER — Other Ambulatory Visit: Payer: Self-pay

## 2021-10-03 DIAGNOSIS — R0683 Snoring: Secondary | ICD-10-CM

## 2021-10-03 DIAGNOSIS — G4733 Obstructive sleep apnea (adult) (pediatric): Secondary | ICD-10-CM

## 2021-10-04 ENCOUNTER — Other Ambulatory Visit: Payer: Self-pay | Admitting: Internal Medicine

## 2021-10-04 ENCOUNTER — Encounter: Payer: Self-pay | Admitting: Podiatry

## 2021-10-04 ENCOUNTER — Ambulatory Visit: Payer: No Typology Code available for payment source | Admitting: Podiatry

## 2021-10-04 DIAGNOSIS — L600 Ingrowing nail: Secondary | ICD-10-CM | POA: Diagnosis not present

## 2021-10-04 DIAGNOSIS — Z0000 Encounter for general adult medical examination without abnormal findings: Secondary | ICD-10-CM

## 2021-10-04 DIAGNOSIS — I10 Essential (primary) hypertension: Secondary | ICD-10-CM

## 2021-10-04 MED ORDER — LOSARTAN POTASSIUM 100 MG PO TABS: 100.0000 mg | ORAL_TABLET | Freq: Every day | ORAL | 1 refills | Status: DC

## 2021-10-04 NOTE — Patient Instructions (Signed)

## 2021-10-04 NOTE — Telephone Encounter (Signed)
Requested Prescriptions  Pending Prescriptions Disp Refills  . losartan (COZAAR) 100 MG tablet 90 tablet 1    Sig: Take 1 tablet (100 mg total) by mouth daily.     Cardiovascular:  Angiotensin Receptor Blockers Failed - 10/04/2021  4:51 PM      Failed - Last BP in normal range    BP Readings from Last 1 Encounters:  09/05/21 (!) 141/82         Passed - Cr in normal range and within 180 days    Creat  Date Value Ref Range Status  09/05/2021 0.68 0.50 - 1.03 mg/dL Final         Passed - K in normal range and within 180 days    Potassium  Date Value Ref Range Status  09/05/2021 4.7 3.5 - 5.3 mmol/L Final         Passed - Patient is not pregnant      Passed - Valid encounter within last 6 months    Recent Outpatient Visits          4 weeks ago Type 2 diabetes mellitus without complication, without long-term current use of insulin ([place])   [facility] [place], Coralie Keens, NP   4 months ago Encounter for general adult medical examination with abnormal findings   [facility] [place], Coralie Keens, NP      Future Appointments            In 1 month Vickie Epley, MD [facility], [facility]

## 2021-10-04 NOTE — Progress Notes (Signed)
Subjective:   Patient ID: Wanda Aguilar, female   DOB: 55 y.o.   MRN: [id number]   HPI Patient presents stating she is developed a lot of pain in her right big toe and feels like it is ingrown and the pedicurist tried to help but that did not make a difference   ROS      Objective:  Physical Exam  Neurovascular status intact incurvated medial border right hallux painful when pressed no active drainage no redness noted     Assessment:  Significant ingrown toenail deformity right hallux medial border with pain     Plan:  H&P reviewed condition recommended correction explained procedure to patient and risk and patient wants surgery signed consent form.  Infiltrated the right hallux 60 mg like Marcaine mixture sterile prep done and using sterile instrumentation remove the medial border exposed matrix applied phenol 3 applications 30 seconds followed by alcohol lavage sterile dressing gave instructions on soaks and to leave dressing on 24 hours but take it off earlier if any throbbing were to occur and encouraged her to call questions concerns signed this

## 2021-10-04 NOTE — Telephone Encounter (Signed)
Copied from [place] [phone]. Topic: Quick Communication - Rx Refill/Question >> Oct 04, 2021  1:31 PM Leward Quan A wrote: Medication: losartan (COZAAR) 100 MG tablet   Has the patient contacted their pharmacy? Yes.  Pharmacy told patient to call Dr  (Agent: If no, request that the patient contact the pharmacy for the refill.) (Agent: If yes, when and what did the pharmacy advise?)  Preferred Pharmacy (with phone number or street name): [id number] - [place], [address]  Phone:  [phone] Fax:  [phone]    Has the patient been seen for an appointment in the last year OR does the patient have an upcoming appointment? Yes.    Agent: Please be advised that RX refills may take up to 3 business days. We ask that you follow-up with your pharmacy.

## 2021-10-05 DIAGNOSIS — G4733 Obstructive sleep apnea (adult) (pediatric): Secondary | ICD-10-CM

## 2021-10-09 ENCOUNTER — Encounter: Payer: Self-pay | Admitting: Primary Care

## 2021-10-09 ENCOUNTER — Other Ambulatory Visit: Payer: Self-pay

## 2021-10-09 ENCOUNTER — Ambulatory Visit (INDEPENDENT_AMBULATORY_CARE_PROVIDER_SITE_OTHER): Payer: No Typology Code available for payment source | Admitting: Primary Care

## 2021-10-09 DIAGNOSIS — G4733 Obstructive sleep apnea (adult) (pediatric): Secondary | ICD-10-CM | POA: Diagnosis not present

## 2021-10-09 DIAGNOSIS — R0683 Snoring: Secondary | ICD-10-CM | POA: Diagnosis not present

## 2021-10-09 NOTE — Progress Notes (Signed)
Virtual Visit via Telephone Note  I connected with Wanda Aguilar on 10/09/21 at 11:30 AM EDT by telephone and verified that I am speaking with the correct person using two identifiers.  Location: Patient: Home Provider: Office   I discussed the limitations, risks, security and privacy concerns of performing an evaluation and management service by telephone and the availability of in person appointments. I also discussed with the patient that there may be a patient responsible charge related to this service. The patient expressed understanding and agreed to proceed.   History of Present Illness: 55 year old female, former smoker. PMH significant for afib, HTN, GERD, type 2 diabetes, obesity.   Previous [facility] pulmonary encounter: 08/18/2021  Patient presents today for sleep consult, referred by cardiology. She reports symptoms of snoring, choking at night when lying on her back, restless sleep and daytime fatigue for three years. She has never had a sleep study. No known family history of sleep apnea. She gets on average 7 hours of sleep a night. Typical bedtime is between10-10:30p. She occasionally has a hard time falling asleep; on average it takes her 30 mins but can take anywhere from 1-2 hours. She wakes up 3 times a night. She starts her day at 5:45 in the morning. 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 She takes a 30 min nap most days. Her weight has increased 40 lbs in the last 2 years. She attributes this to menopause. She was started on gabapentin at bedtime for night sweats by her GYN. Denies narcolepsy, cataplexy or sleep walking.   10/09/2021- Interim hx  Patient contacted today to review sleep study results. She has symptoms of loud snoring, choking at night, restless sleep and daytime sleepiness. Symptoms have been present for 3 years. She has gained 40 lbs. HST on 10/03/21 showed severe OSA, AHI 33.3/hr with SpO2 low 79%. Weight 197lbs. Discussed treatment  options for sleep apnea including weight loss, oral appliance, CPAP therapy or referral to ENT for possible surgical options. She has started ozempic and lost 5 lbs.   Observations/Objective:  - Able to speak in full sentences; no overt shortness of breath or wheezing  Assessment and Plan:  Severe OSA: - HST on 10/03/21 showed severe OSA, AHI 33.3/hr with SpO2 low 79% - We discussed treatment options for sleep apnea. Due to severity of OSA recommend she start CPAP therapy and continue to work on weight loss - Placing DME order for auto CPAP 5-15cm h20  - Advised she focus on side sleeping position until starting PAP therapy and avoid excessive alcohol/sedating medication prior to bed time. Once she gets CPAP she should aim to wear every night for min 4-6 hour and longer. No driving if excessively tired or fatigued.   Follow Up Instructions:  - FU in 3-4 months APP for compliance check or sooner if needed    I discussed the assessment and treatment plan with the patient. The patient was provided an opportunity to ask questions and all were answered. The patient agreed with the plan and demonstrated an understanding of the instructions.   The patient was advised to call back or seek an in-person evaluation if the symptoms worsen or if the condition fails to improve as anticipated.  I provided 28 minutes of non-face-to-face time during this encounter.   Martyn Ehrich, NP

## 2021-10-09 NOTE — Patient Instructions (Addendum)
Home sleep test on 10/03/21 showed severe obstructive sleep apnea, average apnea/hypopnea index 33.3/hr   Recommendations: Continue to work on weight loss efforts Focus on side sleeping position until getting CPAP, once you have received machine aim to wear every night 4-6 hours or more. Do not drive if fatigued.   Follow-up: 3-4 months with [facility] NP or sooner if needed    CPAP and BPAP Information CPAP and BPAP (also called BiPAP) are methods that use air pressure to keep your airways open and to help you breathe well. CPAP and BPAP use different amounts of pressure. Your health care provider will tell you whether CPAP or BPAP would be more helpful for you. CPAP stands for "continuous positive airway pressure." With CPAP, the amount of pressure stays the same while you breathe in (inhale) and out (exhale). BPAP stands for "bi-level positive airway pressure." With BPAP, the amount of pressure will be higher when you inhale and lower when you exhale. This allows you to take larger breaths. CPAP or BPAP may be used in the hospital, or your health care provider may want you to use it at home. You may need to have a sleep study before your health care provider can order a machine for you to use at home. What are the advantages? CPAP or BPAP can be helpful if you have: Sleep apnea. Chronic obstructive pulmonary disease (COPD). Heart failure. Medical conditions that cause muscle weakness, including muscular dystrophy or amyotrophic lateral sclerosis (ALS). Other problems that cause breathing to be shallow, weak, abnormal, or difficult. CPAP and BPAP are most commonly used for obstructive sleep apnea (OSA) to keep the airways from collapsing when the muscles relax during sleep. What are the risks? Generally, this is a safe treatment. However, problems may occur, including: Irritated skin or skin sores if the mask does not fit properly. Dry or stuffy nose or nosebleeds. Dry mouth. Feeling gassy or  bloated. Sinus or lung infection if the equipment is not cleaned properly. When should CPAP or BPAP be used? In most cases, the mask only needs to be worn during sleep. Generally, the mask needs to be worn throughout the night and during any daytime naps. People with certain medical conditions may also need to wear the mask at other times, such as when they are awake. Follow instructions from your health care provider about when to use the machine. What happens during CPAP or BPAP? Both CPAP and BPAP are provided by a small machine with a flexible plastic tube that attaches to a plastic mask that you wear. Air is blown through the mask into your nose or mouth. The amount of pressure that is used to blow the air can be adjusted on the machine. Your health care provider will set the pressure setting and help you find the best mask for you. Tips for using the mask Because the mask needs to be snug, some people feel trapped or closed-in (claustrophobic) when first using the mask. If you feel this way, you may need to get used to the mask. One way to do this is to hold the mask loosely over your nose or mouth and then gradually apply the mask more snugly. You can also gradually increase the amount of time that you use the mask. Masks are available in various types and sizes. If your mask does not fit well, talk with your health care provider about getting a different one. Some common types of masks include: Full face masks, which fit over the  mouth and nose. Nasal masks, which fit over the nose. Nasal pillow or prong masks, which fit into the nostrils. If you are using a mask that fits over your nose and you tend to breathe through your mouth, a chin strap may be applied to help keep your mouth closed. Use a skin barrier to protect your skin as told by your health care provider. Some CPAP and BPAP machines have alarms that may sound if the mask comes off or develops a leak. If you have trouble with the  mask, it is very important that you talk with your health care provider about finding a way to make the mask easier to tolerate. Do not stop using the mask. There could be a negative impact on your health if you stop using the mask. Tips for using the machine Place your CPAP or BPAP machine on a secure table or stand near an electrical outlet. Know where the on/off switch is on the machine. Follow instructions from your health care provider about how to set the pressure on your machine and when you should use it. Do not eat or drink while the CPAP or BPAP machine is on. Food or fluids could get pushed into your lungs by the pressure of the CPAP or BPAP. For home use, CPAP and BPAP machines can be rented or purchased through home health care companies. Many different brands of machines are available. Renting a machine before purchasing may help you find out which particular machine works well for you. Your health insurance company may also decide which machine you may get. Keep the CPAP or BPAP machine and attachments clean. Ask your health care provider for specific instructions. Check the humidifier if you have a dry stuffy nose or nosebleeds. Make sure it is working correctly. Follow these instructions at home: Take over-the-counter and prescription medicines only as told by your health care provider. Ask if you can take sinus medicine if your sinuses are blocked. Do not use any products that contain nicotine or tobacco. These products include cigarettes, chewing tobacco, and vaping devices, such as e-cigarettes. If you need help quitting, ask your health care provider. Keep all follow-up visits. This is important. Contact a health care provider if: You have redness or pressure sores on your head, face, mouth, or nose from the mask or head gear. You have trouble using the CPAP or BPAP machine. You cannot tolerate wearing the CPAP or BPAP mask. Someone tells you that you snore even when wearing  your CPAP or BPAP. Get help right away if: You have trouble breathing. You feel confused. Summary CPAP and BPAP are methods that use air pressure to keep your airways open and to help you breathe well. If you have trouble with the mask, it is very important that you talk with your health care provider about finding a way to make the mask easier to tolerate. Do not stop using the mask. There could be a negative impact to your health if you stop using the mask. Follow instructions from your health care provider about when to use the machine. This information is not intended to replace advice given to you by your health care provider. Make sure you discuss any questions you have with your health care provider. Document Revised: 11/18/2020 Document Reviewed: 11/18/2020 Elsevier Patient Education  2022 [employer].

## 2021-10-10 NOTE — Progress Notes (Signed)
Reviewed and agree with assessment/plan.   Chesley Mires, MD [facility] Pulmonary/Critical Care 10/10/2021, 8:48 AM Pager:  [phone]

## 2021-10-17 ENCOUNTER — Telehealth: Payer: Self-pay | Admitting: Primary Care

## 2021-10-17 NOTE — Telephone Encounter (Signed)
-----   Message from [place] sent at 10/05/2021  3:19 PM EDT ----- HST results

## 2021-10-17 NOTE — Telephone Encounter (Signed)
HST in October showed severe OSA, needs virtual or in office visit to review sleep study results and treatment options  HST 10/03/21 >> Sever OSA, AHI 33/3/hr with SpO2 low 79%

## 2021-10-18 NOTE — Telephone Encounter (Signed)
Called and spoke with pt letting her know the results of the HST per BW. Pt said that she already had the virtual visit with BW (which I saw was done 10/17). Saw order was placed for the cpap. Nothing further needed.

## 2021-10-28 ENCOUNTER — Other Ambulatory Visit: Payer: Self-pay | Admitting: Medical

## 2021-11-04 ENCOUNTER — Other Ambulatory Visit: Payer: Self-pay | Admitting: Family

## 2021-11-04 ENCOUNTER — Other Ambulatory Visit: Payer: Self-pay | Admitting: Internal Medicine

## 2021-11-05 NOTE — Telephone Encounter (Signed)
Requested Prescriptions  Pending Prescriptions Disp Refills  . omeprazole (PRILOSEC) 20 MG capsule [Pharmacy Med Name: OMEPRAZOLE DR 20 MG CAPSULE] 90 capsule 2    Sig: TAKE 1 CAPSULE BY MOUTH EVERY DAY     Gastroenterology: Proton Pump Inhibitors Passed - 11/04/2021  4:24 PM      Passed - Valid encounter within last 12 months    Recent Outpatient Visits          2 months ago Type 2 diabetes mellitus without complication, without long-term current use of insulin [facility])   [facility] [place], Coralie Keens, NP   5 months ago Encounter for general adult medical examination with abnormal findings   [facility] [place], Coralie Keens, NP      Future Appointments            In 3 days Vickie Epley, MD [facility], [facility]

## 2021-11-07 ENCOUNTER — Other Ambulatory Visit: Payer: Self-pay | Admitting: Cardiology

## 2021-11-08 ENCOUNTER — Ambulatory Visit: Payer: No Typology Code available for payment source | Admitting: Cardiology

## 2021-11-08 NOTE — Telephone Encounter (Signed)
This is a [place] pt 

## 2021-11-09 ENCOUNTER — Encounter: Payer: Self-pay | Admitting: Cardiology

## 2021-12-18 ENCOUNTER — Other Ambulatory Visit: Payer: Self-pay | Admitting: Family

## 2021-12-18 ENCOUNTER — Other Ambulatory Visit: Payer: Self-pay | Admitting: Cardiology

## 2021-12-19 ENCOUNTER — Other Ambulatory Visit (HOSPITAL_BASED_OUTPATIENT_CLINIC_OR_DEPARTMENT_OTHER): Payer: Self-pay

## 2021-12-19 MED ORDER — DILTIAZEM HCL ER BEADS 360 MG PO CP24: ORAL_CAPSULE | ORAL | 3 refills | Status: DC

## 2021-12-19 NOTE — Telephone Encounter (Signed)
Please review refill request below. Rx for diltiazem 300mg  daily sent today. Tiadylt 360mg  also on med list. Thank you!

## 2021-12-31 ENCOUNTER — Other Ambulatory Visit: Payer: Self-pay | Admitting: Internal Medicine

## 2021-12-31 ENCOUNTER — Other Ambulatory Visit: Payer: Self-pay | Admitting: Medical

## 2021-12-31 NOTE — Telephone Encounter (Signed)
Requested medication (s) are due for refill today: yes  Requested medication (s) are on the active medication list: expired med  Last refill:  09/01/21  Future visit scheduled: no  Notes to clinic:  expired 12/01/21   Requested Prescriptions  Pending Prescriptions Disp Refills   FLUoxetine (PROZAC) 10 MG capsule [Pharmacy Med Name: FLUOXETINE HCL 10 MG CAPSULE] 90 capsule 0    Sig: TAKE 1 Tool     Psychiatry:  Antidepressants - SSRI Passed - 12/31/2021  2:25 PM      Passed - Valid encounter within last 6 months    Recent Outpatient Visits           3 months ago Type 2 diabetes mellitus without complication, without long-term current use of insulin ([place])   [facility] [place], Coralie Keens, NP   7 months ago Encounter for general adult medical examination with abnormal findings   [facility] [place], Coralie Keens, NP

## 2022-01-22 ENCOUNTER — Other Ambulatory Visit: Payer: Self-pay | Admitting: Internal Medicine

## 2022-01-22 NOTE — Telephone Encounter (Signed)
Requested Prescriptions  Pending Prescriptions Disp Refills   OZEMPIC, 0.25 OR 0.5 MG/DOSE, 2 MG/1.5ML SOPN [Pharmacy Med Name: OZEMPIC 0.25-0.5 MG/DOSE PEN] 1.5 mL 3    Sig: INJECT 0.25 MG INTO THE SKIN ONCE A WEEK. FOR FIRST 4 WEEKS. THEN INCREASE DOSE TO 0.5MG  WEEKLY     Endocrinology:  Diabetes - GLP-1 Receptor Agonists Passed - 01/22/2022  6:07 AM      Passed - HBA1C is between 0 and 7.9 and within 180 days    Hgb A1c MFr Bld  Date Value Ref Range Status  09/05/2021 7.3 (H) <5.7 % of total Hgb Final    Comment:    For someone without known diabetes, a hemoglobin A1c value of 6.5% or greater indicates that they may have  diabetes and this should be confirmed with a follow-up  test. . For someone with known diabetes, a value <7% indicates  that their diabetes is well controlled and a value  greater than or equal to 7% indicates suboptimal  control. A1c targets should be individualized based on  duration of diabetes, age, comorbid conditions, and  other considerations. . Currently, no consensus exists regarding use of hemoglobin A1c for diagnosis of diabetes for children. Renella Cunas - Valid encounter within last 6 months    Recent Outpatient Visits          4 months ago Type 2 diabetes mellitus without complication, without long-term current use of insulin [facility])   [facility] [place], Coralie Keens, NP   8 months ago Encounter for general adult medical examination with abnormal findings   [facility] [place], Coralie Keens, NP

## 2022-01-30 ENCOUNTER — Other Ambulatory Visit: Payer: Self-pay | Admitting: Medical

## 2022-02-09 ENCOUNTER — Encounter: Payer: Self-pay | Admitting: Internal Medicine

## 2022-02-13 ENCOUNTER — Other Ambulatory Visit: Payer: Self-pay | Admitting: Medical

## 2022-03-12 ENCOUNTER — Other Ambulatory Visit: Payer: Self-pay | Admitting: Family

## 2022-03-12 DIAGNOSIS — I480 Paroxysmal atrial fibrillation: Secondary | ICD-10-CM

## 2022-03-12 DIAGNOSIS — Z7901 Long term (current) use of anticoagulants: Secondary | ICD-10-CM

## 2022-03-13 NOTE — Telephone Encounter (Signed)
Pt last saw Dr Quentin Ore 08/02/21, last labs 09/05/21 Creat 0.68, age 56, weight 89.5kg, based on specified criteria pt is on appropriate dosage of Eliquis '5mg'$  BID for afib.  Will refill rx.  ?

## 2022-03-15 ENCOUNTER — Other Ambulatory Visit: Payer: Self-pay | Admitting: Internal Medicine

## 2022-03-16 NOTE — Telephone Encounter (Signed)
Courtesy refill given, appointment needed. ? ?Requested Prescriptions  ?Pending Prescriptions Disp Refills  ?? metFORMIN (GLUCOPHAGE) 500 MG tablet [Pharmacy Med Name: METFORMIN HCL 500 MG TABLET] 60 tablet 0  ?  Sig: TAKE 1 TABLET BY MOUTH 2 TIMES DAILY WITH A MEAL.  ?  ? Endocrinology:  Diabetes - Biguanides Failed - 03/15/2022  1:52 AM  ?  ?  Failed - HBA1C is between 0 and 7.9 and within 180 days  ?  Hgb A1c MFr Bld  ?Date Value Ref Range Status  ?09/05/2021 7.3 (H) <5.7 % of total Hgb Final  ?  Comment:  ?  For someone without known diabetes, a hemoglobin A1c ?value of 6.5% or greater indicates that they may have  ?diabetes and this should be confirmed with a follow-up  ?test. ?. ?For someone with known diabetes, a value <7% indicates  ?that their diabetes is well controlled and a value  ?greater than or equal to 7% indicates suboptimal  ?control. A1c targets should be individualized based on  ?duration of diabetes, age, comorbid conditions, and  ?other considerations. ?. ?Currently, no consensus exists regarding use of ?hemoglobin A1c for diagnosis of diabetes for children. ?. ?  ?   ?  ?  Failed - B12 Level in normal range and within 720 days  ?  Vitamin B-12  ?Date Value Ref Range Status  ?06/22/2015 188 (L) 211 - 911 pg/mL Final  ?   ?  ?  Failed - Valid encounter within last 6 months  ?  Recent Outpatient Visits   ?      ? 6 months ago Type 2 diabetes mellitus without complication, without long-term current use of insulin ([place])  ? [facility] [place], Coralie Keens, NP  ? 10 months ago Encounter for general adult medical examination with abnormal findings  ? [facility] [place], Coralie Keens, NP  ?  ?  ? ?  ?  ?  Failed - CBC within normal limits and completed in the last 12 months  ?  WBC  ?Date Value Ref Range Status  ?05/24/2021 7.5 4.0 - 10.5 K/uL Final  ? ?RBC  ?Date Value Ref Range Status  ?05/24/2021 4.95 3.87 - 5.11 MIL/uL Final  ? ?Hemoglobin  ?Date Value Ref Range Status   ?05/24/2021 15.8 (H) 12.0 - 15.0 g/dL Final  ?02/09/2021 14.7 11.1 - 15.9 g/dL Final  ? ?HCT  ?Date Value Ref Range Status  ?05/24/2021 43.9 36.0 - 46.0 % Final  ? ?Hematocrit  ?Date Value Ref Range Status  ?02/09/2021 42.0 34.0 - 46.6 % Final  ? ?MCHC  ?Date Value Ref Range Status  ?05/24/2021 36.0 30.0 - 36.0 g/dL Final  ? ?MCH  ?Date Value Ref Range Status  ?05/24/2021 31.9 26.0 - 34.0 pg Final  ? ?MCV  ?Date Value Ref Range Status  ?05/24/2021 88.7 80.0 - 100.0 fL Final  ?02/09/2021 92 79 - 97 fL Final  ? ?No results found for: PLTCOUNTKUC, LABPLAT, [place] ?RDW  ?Date Value Ref Range Status  ?05/24/2021 11.9 11.5 - 15.5 % Final  ?02/09/2021 11.9 11.7 - 15.4 % Final  ? ?  ?  ?  Passed - Cr in normal range and within 360 days  ?  Creat  ?Date Value Ref Range Status  ?09/05/2021 0.68 0.50 - 1.03 mg/dL Final  ?   ?  ?  Passed - eGFR in normal range and within 360 days  ?  GFR calc Af Amer  ?Date  Value Ref Range Status  ?02/09/2021 118 >59 mL/min/1.73 Final  ?  Comment:  ?  **In accordance with recommendations from the NKF-ASN Task force,** ?  [facility] is in the process of updating its eGFR calculation to the ?  2021 CKD-EPI creatinine equation that estimates kidney function ?  without a race variable. ?  ? ?GFR, Estimated  ?Date Value Ref Range Status  ?05/24/2021 >60 >60 mL/min Final  ?  Comment:  ?  (NOTE) ?Calculated using the CKD-EPI Creatinine Equation (2021) ?  ? ?GFR  ?Date Value Ref Range Status  ?06/22/2015 74.54 >60.00 mL/min Final  ? ?eGFR  ?Date Value Ref Range Status  ?09/05/2021 103 > OR = 60 mL/min/1.21m Final  ?  Comment:  ?  The eGFR is based on the CKD-EPI 2021 equation. To calculate  ?the new eGFR from a previous Creatinine or Cystatin C ?result, go to https://www.kidney.org/professionals/ ?kdoqi/gfr%5Fcalculator ?  ?   ?  ?  ? ?

## 2022-03-30 ENCOUNTER — Other Ambulatory Visit: Payer: Self-pay | Admitting: Internal Medicine

## 2022-03-30 NOTE — Telephone Encounter (Signed)
Message left to call back to schedule appt so meds can be filled. Call back number given. ?

## 2022-03-30 NOTE — Telephone Encounter (Signed)
Pt has yet to be reached to schedule appt  ?Notes to clinic:  Pharmacy requests as follows: Pharmacy comment: REQUEST FOR 90 DAYS PRESCRIPTION. ? ? ?  ? ?Requested Prescriptions  ?Pending Prescriptions Disp Refills  ? metFORMIN (GLUCOPHAGE) 500 MG tablet [Pharmacy Med Name: METFORMIN HCL 500 MG TABLET] 180 tablet 1  ?  Sig: TAKE 1 TABLET BY MOUTH TWICE A DAY WITH MEALS  ?  ? Endocrinology:  Diabetes - Biguanides Failed - 03/30/2022  8:47 AM  ?  ?  Failed - HBA1C is between 0 and 7.9 and within 180 days  ?  Hgb A1c MFr Bld  ?Date Value Ref Range Status  ?09/05/2021 7.3 (H) <5.7 % of total Hgb Final  ?  Comment:  ?  For someone without known diabetes, a hemoglobin A1c ?value of 6.5% or greater indicates that they may have  ?diabetes and this should be confirmed with a follow-up  ?test. ?. ?For someone with known diabetes, a value <7% indicates  ?that their diabetes is well controlled and a value  ?greater than or equal to 7% indicates suboptimal  ?control. A1c targets should be individualized based on  ?duration of diabetes, age, comorbid conditions, and  ?other considerations. ?. ?Currently, no consensus exists regarding use of ?hemoglobin A1c for diagnosis of diabetes for children. ?. ?  ?  ?  ?  ?  Failed - B12 Level in normal range and within 720 days  ?  Vitamin B-12  ?Date Value Ref Range Status  ?06/22/2015 188 (L) 211 - 911 pg/mL Final  ?  ?  ?  ?  Failed - Valid encounter within last 6 months  ?  Recent Outpatient Visits   ? ?      ? 6 months ago Type 2 diabetes mellitus without complication, without long-term current use of insulin ([place])  ? [facility] [place], Coralie Keens, NP  ? 10 months ago Encounter for general adult medical examination with abnormal findings  ? [facility] [place], Coralie Keens, NP  ? ?  ?  ? ?  ?  ?  Failed - CBC within normal limits and completed in the last 12 months  ?  WBC  ?Date Value Ref Range Status  ?05/24/2021 7.5 4.0 - 10.5 K/uL Final  ? ?RBC  ?Date Value  Ref Range Status  ?05/24/2021 4.95 3.87 - 5.11 MIL/uL Final  ? ?Hemoglobin  ?Date Value Ref Range Status  ?05/24/2021 15.8 (H) 12.0 - 15.0 g/dL Final  ?02/09/2021 14.7 11.1 - 15.9 g/dL Final  ? ?HCT  ?Date Value Ref Range Status  ?05/24/2021 43.9 36.0 - 46.0 % Final  ? ?Hematocrit  ?Date Value Ref Range Status  ?02/09/2021 42.0 34.0 - 46.6 % Final  ? ?MCHC  ?Date Value Ref Range Status  ?05/24/2021 36.0 30.0 - 36.0 g/dL Final  ? ?MCH  ?Date Value Ref Range Status  ?05/24/2021 31.9 26.0 - 34.0 pg Final  ? ?MCV  ?Date Value Ref Range Status  ?05/24/2021 88.7 80.0 - 100.0 fL Final  ?02/09/2021 92 79 - 97 fL Final  ? ?No results found for: PLTCOUNTKUC, LABPLAT, [place] ?RDW  ?Date Value Ref Range Status  ?05/24/2021 11.9 11.5 - 15.5 % Final  ?02/09/2021 11.9 11.7 - 15.4 % Final  ? ?  ?  ?  Passed - Cr in normal range and within 360 days  ?  Creat  ?Date Value Ref Range Status  ?09/05/2021 0.68 0.50 - 1.03 mg/dL Final  ?  ?  ?  ?  Passed - eGFR in normal range and within 360 days  ?  GFR calc Af Amer  ?Date Value Ref Range Status  ?02/09/2021 118 >59 mL/min/1.73 Final  ?  Comment:  ?  **In accordance with recommendations from the NKF-ASN Task force,** ?  [facility] is in the process of updating its eGFR calculation to the ?  2021 CKD-EPI creatinine equation that estimates kidney function ?  without a race variable. ?  ? ?GFR, Estimated  ?Date Value Ref Range Status  ?05/24/2021 >60 >60 mL/min Final  ?  Comment:  ?  (NOTE) ?Calculated using the CKD-EPI Creatinine Equation (2021) ?  ? ?GFR  ?Date Value Ref Range Status  ?06/22/2015 74.54 >60.00 mL/min Final  ? ?eGFR  ?Date Value Ref Range Status  ?09/05/2021 103 > OR = 60 mL/min/1.12m Final  ?  Comment:  ?  The eGFR is based on the CKD-EPI 2021 equation. To calculate  ?the new eGFR from a previous Creatinine or Cystatin C ?result, go to https://www.kidney.org/professionals/ ?kdoqi/gfr%5Fcalculator ?  ?  ?  ?  ?  ? ? ?

## 2022-04-04 ENCOUNTER — Other Ambulatory Visit: Payer: Self-pay | Admitting: Internal Medicine

## 2022-04-04 DIAGNOSIS — I10 Essential (primary) hypertension: Secondary | ICD-10-CM

## 2022-04-04 DIAGNOSIS — Z0000 Encounter for general adult medical examination without abnormal findings: Secondary | ICD-10-CM

## 2022-04-04 NOTE — Telephone Encounter (Signed)
Requested Prescriptions  ?Pending Prescriptions Disp Refills  ?? losartan (COZAAR) 100 MG tablet [Pharmacy Med Name: LOSARTAN POTASSIUM 100 MG TAB] 30 tablet 0  ?  Sig: TAKE 1 TABLET BY MOUTH EVERY DAY  ?  ? Cardiovascular:  Angiotensin Receptor Blockers Failed - 04/04/2022  2:16 AM  ?  ?  Failed - Cr in normal range and within 180 days  ?  Creat  ?Date Value Ref Range Status  ?09/05/2021 0.68 0.50 - 1.03 mg/dL Final  ?   ?  ?  Failed - K in normal range and within 180 days  ?  Potassium  ?Date Value Ref Range Status  ?09/05/2021 4.7 3.5 - 5.3 mmol/L Final  ?   ?  ?  Failed - Last BP in normal range  ?  BP Readings from Last 1 Encounters:  ?09/05/21 (!) 141/82  ?   ?  ?  Failed - Valid encounter within last 6 months  ?  Recent Outpatient Visits   ?      ? 7 months ago Type 2 diabetes mellitus without complication, without long-term current use of insulin ([place])  ? [facility] [place], Coralie Keens, NP  ? 10 months ago Encounter for general adult medical examination with abnormal findings  ? [facility] [place], Coralie Keens, NP  ?  ?  ? ?  ?  ?  Passed - Patient is not pregnant  ?  ?  ? ? ?

## 2022-04-04 NOTE — Telephone Encounter (Signed)
Pt called to schedule appt. Refilled last request  and still no appt scheduled. Call back number given. ?

## 2022-04-17 ENCOUNTER — Other Ambulatory Visit: Payer: Self-pay | Admitting: Internal Medicine

## 2022-04-19 ENCOUNTER — Other Ambulatory Visit: Payer: Self-pay | Admitting: Internal Medicine

## 2022-04-19 NOTE — Telephone Encounter (Signed)
Requested medication (s) are due for refill today: Yes ? ?Requested medication (s) are on the active medication list: Yes ? ?Last refill:  01/22/22 ? ?Future visit scheduled: No ? ?Notes to clinic:  Pharmacy requests new order for 3 ml pen. See request. ? ? ? ?Requested Prescriptions  ?Pending Prescriptions Disp Refills  ? OZEMPIC, 0.25 OR 0.5 MG/DOSE, 2 MG/1.5ML SOPN [Pharmacy Med Name: OZEMPIC 0.25-0.5 MG/DOSE PEN]  3  ?  Sig: INJECT 0.25 MG INTO THE SKIN ONCE A WEEK. FOR FIRST 4 WEEKS. THEN INCREASE DOSE TO 0.'5MG'$  WEEKLY  ?  ? Endocrinology:  Diabetes - GLP-1 Receptor Agonists - semaglutide Failed - 04/17/2022  6:34 PM  ?  ?  Failed - HBA1C in normal range and within 180 days  ?  Hgb A1c MFr Bld  ?Date Value Ref Range Status  ?09/05/2021 7.3 (H) <5.7 % of total Hgb Final  ?  Comment:  ?  For someone without known diabetes, a hemoglobin A1c ?value of 6.5% or greater indicates that they may have  ?diabetes and this should be confirmed with a follow-up  ?test. ?. ?For someone with known diabetes, a value <7% indicates  ?that their diabetes is well controlled and a value  ?greater than or equal to 7% indicates suboptimal  ?control. A1c targets should be individualized based on  ?duration of diabetes, age, comorbid conditions, and  ?other considerations. ?. ?Currently, no consensus exists regarding use of ?hemoglobin A1c for diagnosis of diabetes for children. ?. ?  ?  ?  ?  ?  Failed - Valid encounter within last 6 months  ?  Recent Outpatient Visits   ? ?      ? 7 months ago Type 2 diabetes mellitus without complication, without long-term current use of insulin ([place])  ? [facility] [place], Coralie Keens, NP  ? 11 months ago Encounter for general adult medical examination with abnormal findings  ? [facility] [place], [place] W, NP  ? ?  ?  ? ? ?  ?  ?  Passed - Cr in normal range and within 360 days  ?  Creat  ?Date Value Ref Range Status  ?09/05/2021 0.68 0.50 - 1.03 mg/dL Final  ?  ?  ?  ?  ? ?

## 2022-04-20 ENCOUNTER — Other Ambulatory Visit: Payer: Self-pay | Admitting: Internal Medicine

## 2022-04-20 NOTE — Telephone Encounter (Signed)
Requested medication (s) are due for refill today: yes ? ?Requested medication (s) are on the active medication list: yes ? ?Last refill:  01/22/22 1.5 ml 3 refills ? ?Future visit scheduled: no ? ?Notes to clinic:  Pharmacy comment: Alternative Requested:NEW FORMULARY. BOX IS 3 MLS. NEED NEW RX.  Called patient to schedule appt . No answer , LVMTCB. Do you want to give courtesy refill? ? ? ?  ?Requested Prescriptions  ?Pending Prescriptions Disp Refills  ? OZEMPIC, 0.25 OR 0.5 MG/DOSE, 2 MG/1.5ML SOPN [Pharmacy Med Name: OZEMPIC 0.25-0.5 MG/DOSE PEN]  3  ?  Sig: INJECT 0.25 MG INTO THE SKIN ONCE A WEEK. FOR FIRST 4 WEEKS. THEN INCREASE DOSE TO 0.'5MG'$  WEEKLY  ?  ? Endocrinology:  Diabetes - GLP-1 Receptor Agonists - semaglutide Failed - 04/19/2022  2:55 PM  ?  ?  Failed - HBA1C in normal range and within 180 days  ?  Hgb A1c MFr Bld  ?Date Value Ref Range Status  ?09/05/2021 7.3 (H) <5.7 % of total Hgb Final  ?  Comment:  ?  For someone without known diabetes, a hemoglobin A1c ?value of 6.5% or greater indicates that they may have  ?diabetes and this should be confirmed with a follow-up  ?test. ?. ?For someone with known diabetes, a value <7% indicates  ?that their diabetes is well controlled and a value  ?greater than or equal to 7% indicates suboptimal  ?control. A1c targets should be individualized based on  ?duration of diabetes, age, comorbid conditions, and  ?other considerations. ?. ?Currently, no consensus exists regarding use of ?hemoglobin A1c for diagnosis of diabetes for children. ?. ?  ?  ?  ?  ?  Failed - Valid encounter within last 6 months  ?  Recent Outpatient Visits   ? ?      ? 7 months ago Type 2 diabetes mellitus without complication, without long-term current use of insulin ([place])  ? [facility] [place], Coralie Keens, NP  ? 11 months ago Encounter for general adult medical examination with abnormal findings  ? [facility] [place], [place] W, NP  ? ?  ?  ? ? ?  ?  ?  Passed -  Cr in normal range and within 360 days  ?  Creat  ?Date Value Ref Range Status  ?09/05/2021 0.68 0.50 - 1.03 mg/dL Final  ?  ?  ?  ?  ? ?

## 2022-04-20 NOTE — Telephone Encounter (Signed)
Please see Pharmacy notes on attached prescription. ?

## 2022-04-20 NOTE — Telephone Encounter (Signed)
Patient needs appt

## 2022-04-20 NOTE — Telephone Encounter (Signed)
Called pt  and LM on VM to call office to schedule appointment. Sent pt MyChart message to make appointment. ? ?Requested Prescriptions  ?Pending Prescriptions Disp Refills  ? OZEMPIC, 0.25 OR 0.5 MG/DOSE, 2 MG/1.5ML SOPN [Pharmacy Med Name: OZEMPIC 0.25-0.5 MG/DOSE PEN]  3  ?  Sig: INJECT 0.25 MG INTO THE SKIN ONCE A WEEK. FOR FIRST 4 WEEKS. THEN INCREASE DOSE TO 0.'5MG'$  WEEKLY  ?  ? Endocrinology:  Diabetes - GLP-1 Receptor Agonists - semaglutide Failed - 04/20/2022 12:07 PM  ?  ?  Failed - HBA1C in normal range and within 180 days  ?  Hgb A1c MFr Bld  ?Date Value Ref Range Status  ?09/05/2021 7.3 (H) <5.7 % of total Hgb Final  ?  Comment:  ?  For someone without known diabetes, a hemoglobin A1c ?value of 6.5% or greater indicates that they may have  ?diabetes and this should be confirmed with a follow-up  ?test. ?. ?For someone with known diabetes, a value <7% indicates  ?that their diabetes is well controlled and a value  ?greater than or equal to 7% indicates suboptimal  ?control. A1c targets should be individualized based on  ?duration of diabetes, age, comorbid conditions, and  ?other considerations. ?. ?Currently, no consensus exists regarding use of ?hemoglobin A1c for diagnosis of diabetes for children. ?. ?  ?  ?  ?  ?  Failed - Valid encounter within last 6 months  ?  Recent Outpatient Visits   ? ?      ? 7 months ago Type 2 diabetes mellitus without complication, without long-term current use of insulin ([place])  ? [facility] [place], Coralie Keens, NP  ? 11 months ago Encounter for general adult medical examination with abnormal findings  ? [facility] [place], [place] W, NP  ? ?  ?  ? ? ?  ?  ?  Passed - Cr in normal range and within 360 days  ?  Creat  ?Date Value Ref Range Status  ?09/05/2021 0.68 0.50 - 1.03 mg/dL Final  ?  ?  ?  ?  ? ? ?

## 2022-05-09 ENCOUNTER — Other Ambulatory Visit: Payer: Self-pay | Admitting: Internal Medicine

## 2022-05-09 DIAGNOSIS — I10 Essential (primary) hypertension: Secondary | ICD-10-CM

## 2022-05-09 DIAGNOSIS — Z0000 Encounter for general adult medical examination without abnormal findings: Secondary | ICD-10-CM

## 2022-05-09 NOTE — Telephone Encounter (Signed)
Requested medication (s) are due for refill today: Yes ? ?Requested medication (s) are on the active medication list: Yes ? ?Last refill:   ? ?Future visit scheduled: No ? ?Notes to clinic:  Pt. Already received courtesy refill. Left message to call and make appointment. ? ? ? ?Requested Prescriptions  ?Pending Prescriptions Disp Refills  ? losartan (COZAAR) 100 MG tablet [Pharmacy Med Name: LOSARTAN POTASSIUM 100 MG TAB] 30 tablet 0  ?  Sig: TAKE 1 TABLET BY MOUTH EVERY DAY  ?  ? Cardiovascular:  Angiotensin Receptor Blockers Failed - 05/09/2022  8:30 AM  ?  ?  Failed - Cr in normal range and within 180 days  ?  Creat  ?Date Value Ref Range Status  ?09/05/2021 0.68 0.50 - 1.03 mg/dL Final  ?   ?  ?  Failed - K in normal range and within 180 days  ?  Potassium  ?Date Value Ref Range Status  ?09/05/2021 4.7 3.5 - 5.3 mmol/L Final  ?   ?  ?  Failed - Last BP in normal range  ?  BP Readings from Last 1 Encounters:  ?09/05/21 (!) 141/82  ?   ?  ?  Failed - Valid encounter within last 6 months  ?  Recent Outpatient Visits   ? ?      ? 8 months ago Type 2 diabetes mellitus without complication, without long-term current use of insulin ([place])  ? [facility] [place], Coralie Keens, NP  ? 11 months ago Encounter for general adult medical examination with abnormal findings  ? [facility] [place], Coralie Keens, NP  ? ?  ?  ? ? ?  ?  ?  Passed - Patient is not pregnant  ?  ?  ? ?

## 2022-06-21 IMAGING — DX DG CHEST 1V PORT
1 series · 1 of 1 positions shown · non-contrast
Comparison: 09/17/2016

CLINICAL DATA: Chest pain

EXAM:
PORTABLE CHEST 1 VIEW

[chest ap]
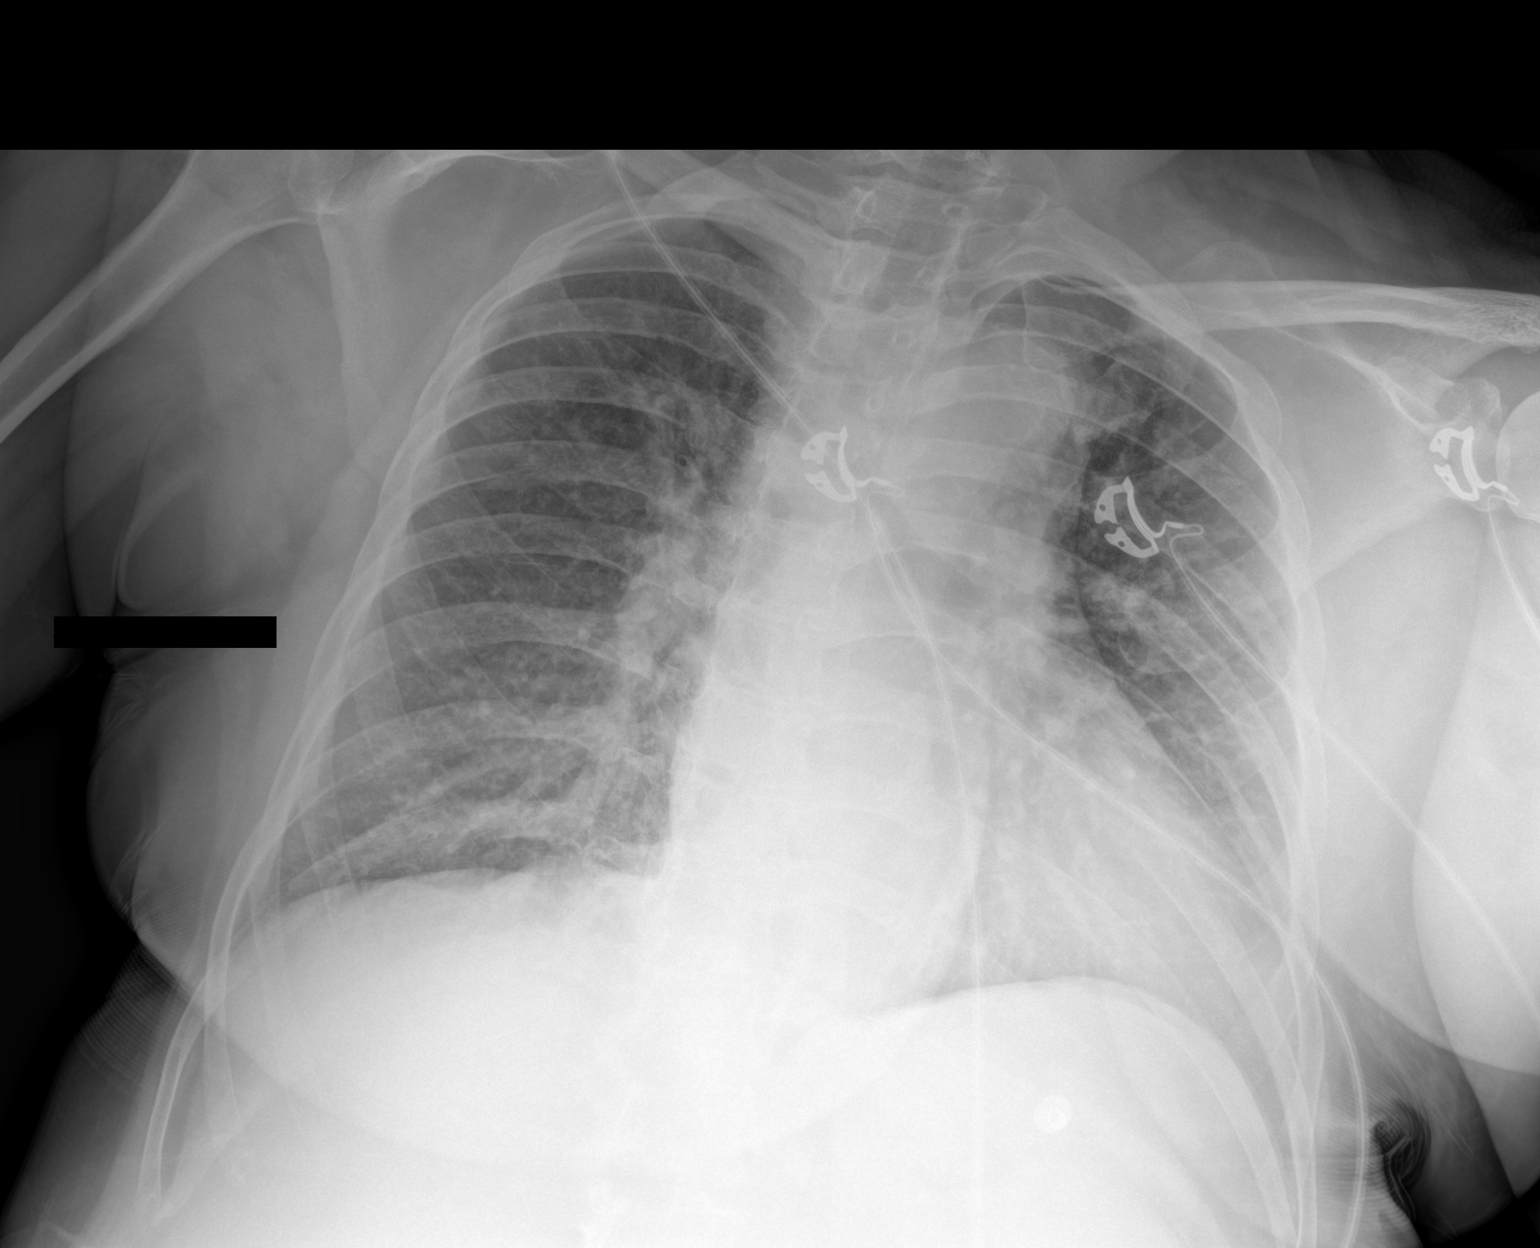

[1 of 1 positions shown; findings below may reference images not displayed]

FINDINGS: Cardiac shadow is within normal limits. Lungs are clear bilaterally.
No bony abnormality is noted.
IMPRESSION: No active disease.

## 2022-06-25 ENCOUNTER — Encounter: Payer: Self-pay | Admitting: Internal Medicine

## 2022-06-25 ENCOUNTER — Ambulatory Visit: Payer: No Typology Code available for payment source | Admitting: Internal Medicine

## 2022-06-25 VITALS — BP 132/88 | HR 68 | Temp 96.2°F | Ht 63.0 in | Wt 194.0 lb

## 2022-06-25 DIAGNOSIS — Z1211 Encounter for screening for malignant neoplasm of colon: Secondary | ICD-10-CM | POA: Diagnosis not present

## 2022-06-25 DIAGNOSIS — Z0001 Encounter for general adult medical examination with abnormal findings: Secondary | ICD-10-CM

## 2022-06-25 DIAGNOSIS — E6609 Other obesity due to excess calories: Secondary | ICD-10-CM

## 2022-06-25 DIAGNOSIS — Z0000 Encounter for general adult medical examination without abnormal findings: Secondary | ICD-10-CM | POA: Diagnosis not present

## 2022-06-25 DIAGNOSIS — Z6834 Body mass index (BMI) 34.0-34.9, adult: Secondary | ICD-10-CM

## 2022-06-25 DIAGNOSIS — E119 Type 2 diabetes mellitus without complications: Secondary | ICD-10-CM | POA: Diagnosis not present

## 2022-06-25 DIAGNOSIS — I10 Essential (primary) hypertension: Secondary | ICD-10-CM

## 2022-06-25 MED ORDER — FLUOXETINE HCL 10 MG PO CAPS: 10.0000 mg | ORAL_CAPSULE | Freq: Every day | ORAL | 1 refills | Status: DC

## 2022-06-25 MED ORDER — LOSARTAN POTASSIUM 100 MG PO TABS
100.0000 mg | ORAL_TABLET | Freq: Every day | ORAL | 1 refills | Status: DC
Start: 2022-06-25 — End: 2023-01-28

## 2022-06-25 MED ORDER — OMEPRAZOLE 20 MG PO CPDR: 20.0000 mg | DELAYED_RELEASE_CAPSULE | Freq: Every day | ORAL | 1 refills | Status: DC

## 2022-06-25 MED ORDER — OZEMPIC (0.25 OR 0.5 MG/DOSE) 2 MG/1.5ML ~~LOC~~ SOPN: 0.2500 mg | PEN_INJECTOR | SUBCUTANEOUS | 0 refills | Status: DC

## 2022-06-25 MED ORDER — METFORMIN HCL 500 MG PO TABS
500.0000 mg | ORAL_TABLET | Freq: Two times a day (BID) | ORAL | 1 refills | Status: DC
Start: 2022-06-25 — End: 2023-01-28

## 2022-06-25 MED ORDER — HYDROCHLOROTHIAZIDE 25 MG PO TABS
25.0000 mg | ORAL_TABLET | Freq: Every day | ORAL | 1 refills | Status: DC
Start: 2022-06-25 — End: 2023-09-03

## 2022-06-25 NOTE — Assessment & Plan Note (Signed)
Encourage diet and exercise for weight loss 

## 2022-06-25 NOTE — Patient Instructions (Signed)
Health Maintenance for Postmenopausal Women Menopause is a normal process in which your ability to get pregnant comes to an end. This process happens slowly over many months or years, usually between the ages of 48 and 55. Menopause is complete when you have missed your menstrual period for 12 months. It is important to talk with your health care provider about some of the most common conditions that affect women after menopause (postmenopausal women). These include heart disease, cancer, and bone loss (osteoporosis). Adopting a healthy lifestyle and getting preventive care can help to promote your health and wellness. The actions you take can also lower your chances of developing some of these common conditions. What are the signs and symptoms of menopause? During menopause, you may have the following symptoms: Hot flashes. These can be moderate or severe. Night sweats. Decrease in sex drive. Mood swings. Headaches. Tiredness (fatigue). Irritability. Memory problems. Problems falling asleep or staying asleep. Talk with your health care provider about treatment options for your symptoms. Do I need hormone replacement therapy? Hormone replacement therapy is effective in treating symptoms that are caused by menopause, such as hot flashes and night sweats. Hormone replacement carries certain risks, especially as you become older. If you are thinking about using estrogen or estrogen with progestin, discuss the benefits and risks with your health care provider. How can I reduce my risk for heart disease and stroke? The risk of heart disease, heart attack, and stroke increases as you age. One of the causes may be a change in the body's hormones during menopause. This can affect how your body uses dietary fats, triglycerides, and cholesterol. Heart attack and stroke are medical emergencies. There are many things that you can do to help prevent heart disease and stroke. Watch your blood pressure High  blood pressure causes heart disease and increases the risk of stroke. This is more likely to develop in people who have high blood pressure readings or are overweight. Have your blood pressure checked: Every 3-5 years if you are 18-39 years of age. Every year if you are 40 years old or older. Eat a healthy diet  Eat a diet that includes plenty of vegetables, fruits, low-fat dairy products, and lean protein. Do not eat a lot of foods that are high in solid fats, added sugars, or sodium. Get regular exercise Get regular exercise. This is one of the most important things you can do for your health. Most adults should: Try to exercise for at least 150 minutes each week. The exercise should increase your heart rate and make you sweat (moderate-intensity exercise). Try to do strengthening exercises at least twice each week. Do these in addition to the moderate-intensity exercise. Spend less time sitting. Even light physical activity can be beneficial. Other tips Work with your health care provider to achieve or maintain a healthy weight. Do not use any products that contain nicotine or tobacco. These products include cigarettes, chewing tobacco, and vaping devices, such as e-cigarettes. If you need help quitting, ask your health care provider. Know your numbers. Ask your health care provider to check your cholesterol and your blood sugar (glucose). Continue to have your blood tested as directed by your health care provider. Do I need screening for cancer? Depending on your health history and family history, you may need to have cancer screenings at different stages of your life. This may include screening for: Breast cancer. Cervical cancer. Lung cancer. Colorectal cancer. What is my risk for osteoporosis? After menopause, you may be   at increased risk for osteoporosis. Osteoporosis is a condition in which bone destruction happens more quickly than new bone creation. To help prevent osteoporosis or  the bone fractures that can happen because of osteoporosis, you may take the following actions: If you are 19-50 years old, get at least 1,000 mg of calcium and at least 600 international units (IU) of vitamin D per day. If you are older than age 50 but younger than age 70, get at least 1,200 mg of calcium and at least 600 international units (IU) of vitamin D per day. If you are older than age 70, get at least 1,200 mg of calcium and at least 800 international units (IU) of vitamin D per day. Smoking and drinking excessive alcohol increase the risk of osteoporosis. Eat foods that are rich in calcium and vitamin D, and do weight-bearing exercises several times each week as directed by your health care provider. How does menopause affect my mental health? Depression may occur at any age, but it is more common as you become older. Common symptoms of depression include: Feeling depressed. Changes in sleep patterns. Changes in appetite or eating patterns. Feeling an overall lack of motivation or enjoyment of activities that you previously enjoyed. Frequent crying spells. Talk with your health care provider if you think that you are experiencing any of these symptoms. General instructions See your health care provider for regular wellness exams and vaccines. This may include: Scheduling regular health, dental, and eye exams. Getting and maintaining your vaccines. These include: Influenza vaccine. Get this vaccine each year before the flu season begins. Pneumonia vaccine. Shingles vaccine. Tetanus, diphtheria, and pertussis (Tdap) booster vaccine. Your health care provider may also recommend other immunizations. Tell your health care provider if you have ever been abused or do not feel safe at home. Summary Menopause is a normal process in which your ability to get pregnant comes to an end. This condition causes hot flashes, night sweats, decreased interest in sex, mood swings, headaches, or lack  of sleep. Treatment for this condition may include hormone replacement therapy. Take actions to keep yourself healthy, including exercising regularly, eating a healthy diet, watching your weight, and checking your blood pressure and blood sugar levels. Get screened for cancer and depression. Make sure that you are up to date with all your vaccines. This information is not intended to replace advice given to you by your health care provider. Make sure you discuss any questions you have with your health care provider. Document Revised: 05/01/2021 Document Reviewed: 05/01/2021 Elsevier Patient Education  2023 Elsevier Inc.  

## 2022-06-25 NOTE — Progress Notes (Signed)
 Subjective:    Patient ID: Wanda Aguilar, female    DOB: 09/22/1966, 56 y.o.   MRN: [id number]  HPI  Patient presents to clinic today for her annual exam.  Flu: never Tetanus: < 10 years COVID: never Pneumovax: never Shingrix: never Pap smear: 09/2018, hysterectomy Mammogram: >2 years ago, follows with GYN Colon screening: 4 years ago Vision screening: as needed Dentist:  biannually  Diet: She does eat meat. She consumes fruits and veggies. She tries to avoid fried foods. She drinks mostly water, zero sugar soda. Exercise: none  Review of Systems     Past Medical History:  Diagnosis Date   Atrial fibrillation (HCC)    Fibroids    Frequent headaches    Hypertension    Urine incontinence     Current Outpatient Medications  Medication Sig Dispense Refill   metFORMIN (GLUCOPHAGE) 500 MG tablet TAKE 1 TABLET BY MOUTH 2 TIMES DAILY WITH A MEAL. 60 tablet 0   albuterol (VENTOLIN HFA) 108 (90 Base) MCG/ACT inhaler Inhale 1 puff into the lungs every 6 (six) hours as needed for wheezing or shortness of breath. 1 each 0   apixaban (ELIQUIS) 5 MG TABS tablet TAKE 1 TABLET BY MOUTH TWICE A DAY 60 tablet 5   diclofenac (VOLTAREN) 50 MG EC tablet Take 50 mg by mouth as needed.     diltiazem (CARDIZEM) 30 MG tablet Take 1 tablet by mouth up to 3X a day as needed for a heart rate of 110 or greater. 30 tablet 5   diltiazem (TIADYLT ER) 360 MG 24 hr capsule TAKE 1 CAPSULE BY MOUTH EVERY DAY 90 capsule 3   FLUoxetine (PROZAC) 10 MG capsule Take 1 capsule (10 mg total) by mouth daily. 90 capsule 0   gabapentin (NEURONTIN) 100 MG capsule Take 100 mg by mouth at bedtime.     hydrochlorothiazide (HYDRODIURIL) 25 MG tablet Take 1 tablet (25 mg total) by mouth daily. 90 tablet 1   Insulin Pen Needle 31G X 5 MM MISC BD Pen Needles- brand specific Inject insulin via insulin pen 6 x daily 30 each 1   losartan (COZAAR) 100 MG tablet TAKE 1 TABLET BY MOUTH EVERY DAY 30 tablet 0   omeprazole  (PRILOSEC) 20 MG capsule TAKE 1 CAPSULE BY MOUTH EVERY DAY 90 capsule 2   OZEMPIC, 0.25 OR 0.5 MG/DOSE, 2 MG/1.5ML SOPN INJECT 0.25 MG INTO THE SKIN ONCE A WEEK. FOR FIRST 4 WEEKS. THEN INCREASE DOSE TO 0.5MG WEEKLY 1.5 mL 3   TIADYLT ER 360 MG 24 hr capsule TAKE 1 CAPSULE BY MOUTH EVERY DAY 90 capsule 3   No current facility-administered medications for this visit.    No Known Allergies  Family History  Problem Relation Age of Onset   Diabetes Father    Diabetes Paternal Aunt    Diabetes Paternal Uncle    Arthritis Maternal Grandmother    Alcohol abuse Maternal Grandfather    Diabetes Paternal Grandmother    Diabetes Paternal Grandfather     Social History   Socioeconomic History   Marital status: Married    Spouse name: Not on file   Number of children: Not on file   Years of education: Not on file   Highest education level: Not on file  Occupational History   Not on file  Tobacco Use   Smoking status: Former    Packs/day: 0.30    Years: 4.00    Total pack years: 1.20    Types: Cigarettes      Start date: 01/14/2009    Quit date: 12/21/2020    Years since quitting: 1.5   Smokeless tobacco: Never  Vaping Use   Vaping Use: Never used  Substance and Sexual Activity   Alcohol use: Yes    Comment: occasional   Drug use: No   Sexual activity: Yes  Other Topics Concern   Not on file  Social History Narrative   Not on file   Social Determinants of Health   Financial Resource Strain: Not on file  Food Insecurity: Not on file  Transportation Needs: Not on file  Physical Activity: Not on file  Stress: Not on file  Social Connections: Not on file  Intimate Partner Violence: Not on file     Constitutional: Patient reports intermittent headaches.  Denies fever, malaise, fatigue, or abrupt weight changes.  HEENT: Denies eye pain, eye redness, ear pain, ringing in the ears, wax buildup, runny nose, nasal congestion, bloody nose, or sore throat. Respiratory: Denies  difficulty breathing, shortness of breath, cough or sputum production.   Cardiovascular: Denies chest pain, chest tightness, palpitations or swelling in the hands or feet.  Gastrointestinal: Denies abdominal pain, bloating, constipation, diarrhea or blood in the stool.  GU: Denies urgency, frequency, pain with urination, burning sensation, blood in urine, odor or discharge. Musculoskeletal: Denies decrease in range of motion, difficulty with gait, muscle pain or joint pain and swelling.  Skin: Denies redness, rashes, lesions or ulcercations.  Neurological: Patient reports insomnia, hot flashes.  Denies dizziness, difficulty with memory, difficulty with speech or problems with balance and coordination.  Psych: Patient has a history of anxiety.  Denies depression, SI/HI.  No other specific complaints in a complete review of systems (except as listed in HPI above).  Objective:   Physical Exam  BP 132/88 (BP Location: Right Arm, Patient Position: Sitting, Cuff Size: Large)   Pulse 68   Temp (!) 96.2 F (35.7 C) (Temporal)   Ht 5' 3" (1.6 m)   Wt 194 lb (88 kg)   LMP 03/22/2014   SpO2 99%   BMI 34.37 kg/m   Wt Readings from Last 3 Encounters:  09/05/21 197 lb 6.4 oz (89.5 kg)  08/18/21 195 lb 6.4 oz (88.6 kg)  08/02/21 196 lb (88.9 kg)    General: Appears her stated age, obese, in NAD. Skin: Warm, dry and intact. No ulcerations noted. HEENT: Head: normal shape and size; Eyes: sclera white, no icterus, conjunctiva pink, PERRLA and EOMs intact;  Neck:  Neck supple, trachea midline. No masses, lumps or thyromegaly present.  Cardiovascular: Normal rate and rhythm. S1,S2 noted.  No murmur, rubs or gallops noted. No JVD or BLE edema. No carotid bruits noted. Pulmonary/Chest: Normal effort and positive vesicular breath sounds. No respiratory distress. No wheezes, rales or ronchi noted.  Abdomen: Normal bowel sounds.  Musculoskeletal:Strength 5/5 BUE/BLE. No difficulty with gait.   Neurological: Alert and oriented. Cranial nerves II-XII grossly intact. Coordination normal.  Psychiatric: Mood and affect normal. Behavior is normal. Judgment and thought content normal.    BMET    Component Value Date/Time   NA 140 09/05/2021 1538   NA 141 02/09/2021 0917   K 4.7 09/05/2021 1538   CL 100 09/05/2021 1538   CO2 29 09/05/2021 1538   GLUCOSE 115 09/05/2021 1538   BUN 19 09/05/2021 1538   BUN 15 02/09/2021 0917   CREATININE 0.68 09/05/2021 1538   CALCIUM 10.1 09/05/2021 1538   GFRNONAA >60 05/24/2021 0517   GFRAA 118 02/09/2021 0917      Lipid Panel     Component Value Date/Time   CHOL 236 (H) 09/05/2021 1538   TRIG 466 (H) 09/05/2021 1538   HDL 44 (L) 09/05/2021 1538   CHOLHDL 5.4 (H) 09/05/2021 1538   VLDL 57.6 (H) 06/22/2015 0746   LDLCALC  09/05/2021 1538     Comment:     . LDL cholesterol not calculated. Triglyceride levels greater than 400 mg/dL invalidate calculated LDL results. . Reference range: <100 . Desirable range <100 mg/dL for primary prevention;   <70 mg/dL for patients with CHD or diabetic patients  with > or = 2 CHD risk factors. [name] LDL-C is now calculated using the Martin-Hopkins  calculation, which is a validated novel method providing  better accuracy than the Friedewald equation in the  estimation of LDL-C.  Cresenciano Genre et al. Annamaria Helling. 9292;446(28): 2061-2068  (http://education.[facility].com/faq/FAQ164)     CBC    Component Value Date/Time   WBC 7.5 05/24/2021 0517   RBC 4.95 05/24/2021 0517   HGB 15.8 (H) 05/24/2021 0517   HGB 14.7 02/09/2021 0917   HCT 43.9 05/24/2021 0517   HCT 42.0 02/09/2021 0917   PLT 275 05/24/2021 0517   PLT 275 02/09/2021 0917   MCV 88.7 05/24/2021 0517   MCV 92 02/09/2021 0917   MCH 31.9 05/24/2021 0517   MCHC 36.0 05/24/2021 0517   RDW 11.9 05/24/2021 0517   RDW 11.9 02/09/2021 0917   LYMPHSABS 2.1 01/22/2021 0339   MONOABS 0.6 01/22/2021 0339   EOSABS 0.1 01/22/2021 0339   BASOSABS  0.1 01/22/2021 0339    Hgb A1C Lab Results  Component Value Date   HGBA1C 7.3 (H) 09/05/2021           Assessment & Plan:   Preventative Health Maintenance:  Encouraged her to get a flu shot in the fall She declines tetanus booster Encouraged her to get a COVID-vaccine She declines Pneumovax Discussed Shingrix vaccine, she will check coverage with her insurance company and schedule a nurse visit if she would like to have this done She no longer needs Pap smears She would like for her GYN to schedule her mammogram Referral to GI for screening colonoscopy Encouraged her to consume a balanced diet and exercise regimen Advised her to see an eye doctor and dentist annually We will check CBC, c-Met, lipid, A1c, urine microalbumin today  RTC in 3 months, follow-up chronic conditions Webb Silversmith, NP

## 2022-06-26 LAB — MICROALBUMIN / CREATININE URINE RATIO
Creatinine, Urine: 240 mg/dL (ref 20–275)
Microalb Creat Ratio: 11 mcg/mg creat (ref <30)
Microalb, Ur: 2.6 mg/dL

## 2022-06-26 LAB — CBC
HCT: 43.8 % (ref 35.0–45.0)
Hemoglobin: 14.9 g/dL (ref 11.7–15.5)
MCH: 30.7 pg (ref 27.0–33.0)
MCHC: 34 g/dL (ref 32.0–36.0)
MCV: 90.3 fL (ref 80.0–100.0)
MPV: 10.4 fL (ref 7.5–12.5)
Platelets: 273 10*3/uL (ref 140–400)
RBC: 4.85 10*6/uL (ref 3.80–5.10)
RDW: 12 % (ref 11.0–15.0)
WBC: 7.8 10*3/uL (ref 3.8–10.8)

## 2022-06-26 LAB — COMPLETE METABOLIC PANEL WITH GFR
AG Ratio: 1.8 (calc) (ref 1.0–2.5)
ALT: 28 U/L (ref 6–29)
AST: 16 U/L (ref 10–35)
Albumin: 4.6 g/dL (ref 3.6–5.1)
Alkaline phosphatase (APISO): 93 U/L (ref 37–153)
BUN: 20 mg/dL (ref 7–25)
CO2: 27 mmol/L (ref 20–32)
Calcium: 10 mg/dL (ref 8.6–10.4)
Chloride: 100 mmol/L (ref 98–110)
Creat: 0.79 mg/dL (ref 0.50–1.03)
Globulin: 2.5 g/dL (calc) (ref 1.9–3.7)
Glucose, Bld: 193 mg/dL — ABNORMAL HIGH (ref 65–99)
Potassium: 4.6 mmol/L (ref 3.5–5.3)
Sodium: 139 mmol/L (ref 135–146)
Total Bilirubin: 0.5 mg/dL (ref 0.2–1.2)
Total Protein: 7.1 g/dL (ref 6.1–8.1)
eGFR: 88 mL/min/{1.73_m2} (ref >=60)

## 2022-06-26 LAB — LIPID PANEL
Cholesterol: 223 mg/dL — ABNORMAL HIGH (ref <200)
HDL: 49 mg/dL — ABNORMAL LOW (ref >=50)
LDL Cholesterol (Calc): 135 mg/dL (calc) — ABNORMAL HIGH
Non-HDL Cholesterol (Calc): 174 mg/dL (calc) — ABNORMAL HIGH (ref <130)
Total CHOL/HDL Ratio: 4.6 (calc) (ref <5.0)
Triglycerides: 252 mg/dL — ABNORMAL HIGH (ref <150)

## 2022-06-26 LAB — HEMOGLOBIN A1C
Hgb A1c MFr Bld: 7.4 % of total Hgb — ABNORMAL HIGH (ref <5.7)
Mean Plasma Glucose: 166 mg/dL
eAG (mmol/L): 9.2 mmol/L

## 2022-06-28 MED ORDER — ROSUVASTATIN CALCIUM 10 MG PO TABS: 10.0000 mg | ORAL_TABLET | Freq: Every day | ORAL | 1 refills | Status: DC

## 2022-06-28 NOTE — Addendum Note (Signed)
Addended by: Jearld Fenton on: 06/28/2022 08:02 AM   Modules accepted: Orders

## 2022-07-05 ENCOUNTER — Encounter: Payer: No Typology Code available for payment source | Admitting: Sports Medicine

## 2022-08-03 ENCOUNTER — Telehealth: Payer: Self-pay | Admitting: Cardiology

## 2022-08-03 NOTE — Telephone Encounter (Signed)
Have made several attempts to schedule with no success Deleted recall 

## 2022-10-02 ENCOUNTER — Ambulatory Visit: Payer: No Typology Code available for payment source | Admitting: Internal Medicine

## 2022-10-22 IMAGING — DX DG CHEST 1V PORT
1 series · 1 of 1 positions shown · non-contrast
Comparison: 01/21/2021.  09/17/2016.

CLINICAL DATA: Chest pain.

EXAM:
PORTABLE CHEST 1 VIEW

[chest ap]
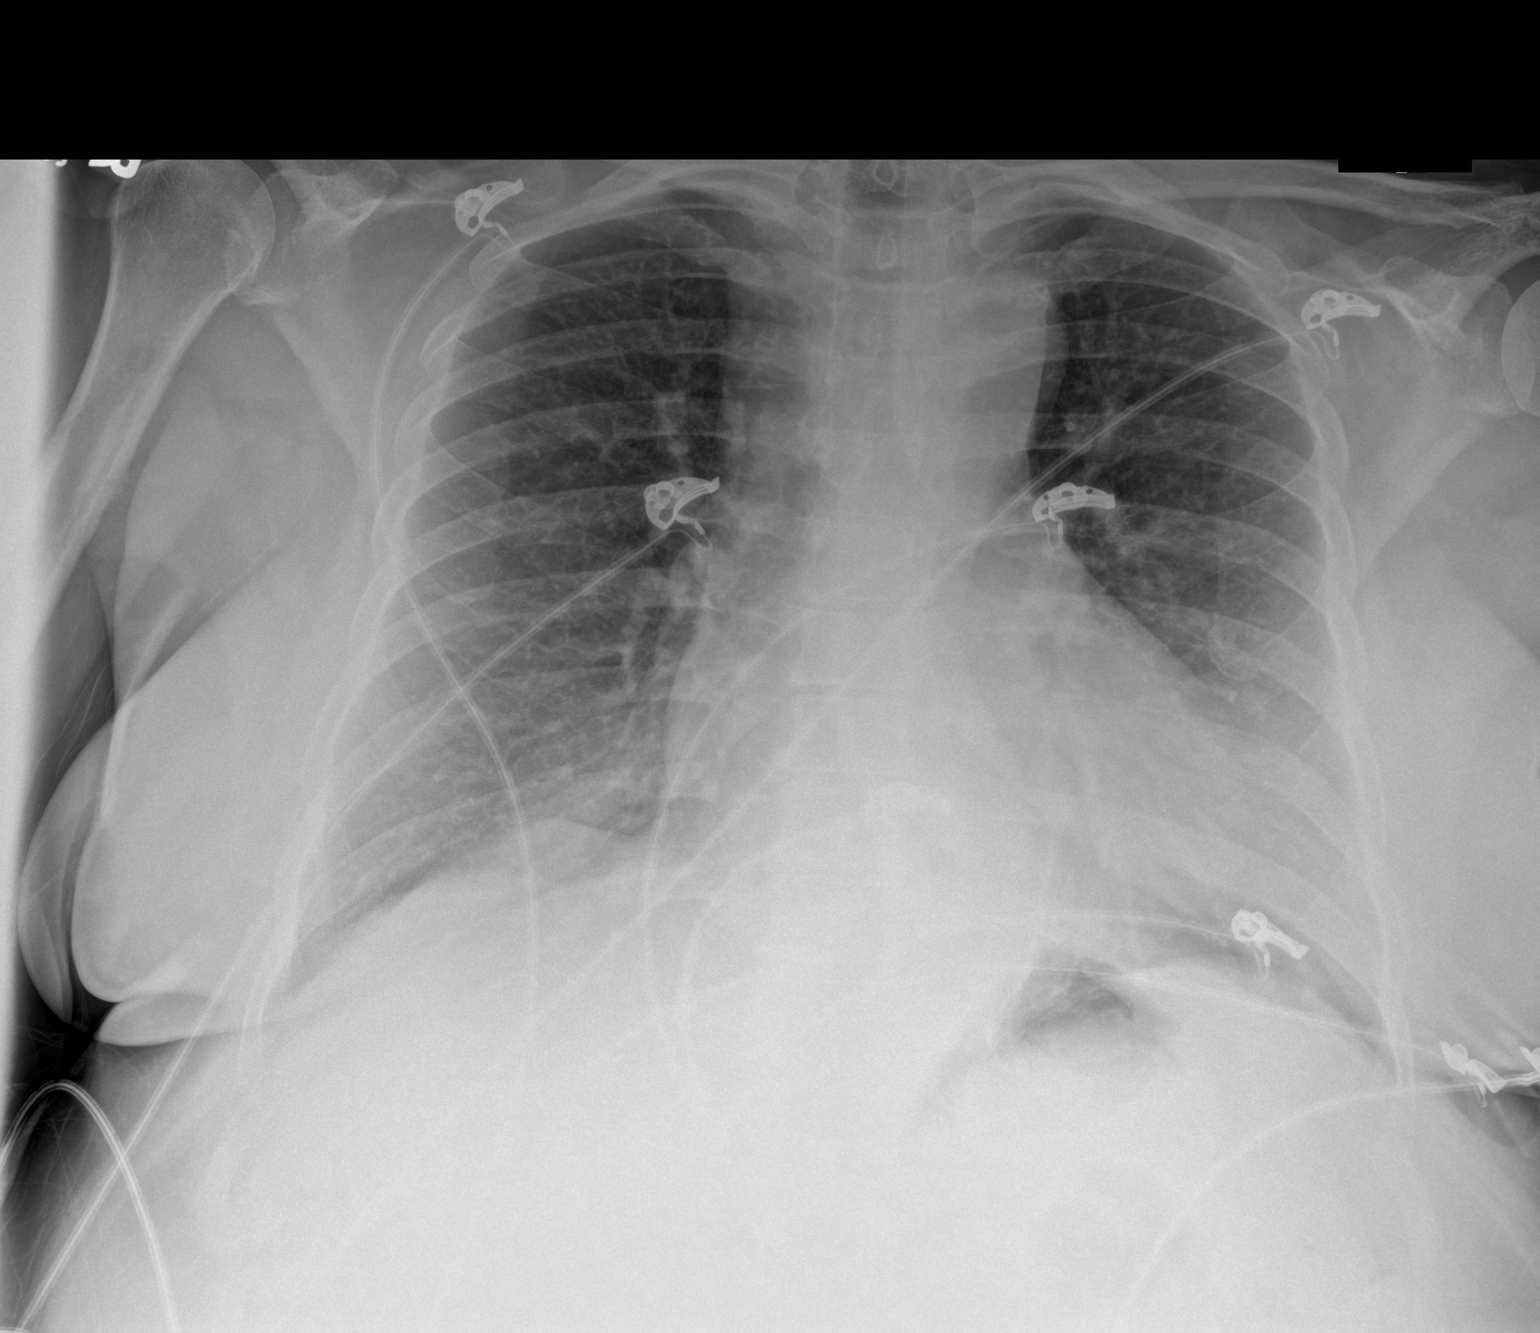

[1 of 1 positions shown; findings below may reference images not displayed]

FINDINGS: Apical lordotic projection. Mediastinum stable. Tortuous thoracic
aorta. Heart size stable. Low lung volumes. No focal infiltrate. No
pleural effusion or pneumothorax. No acute bony abnormality.
IMPRESSION: Low lung volumes.  No acute cardiopulmonary disease identified.

## 2022-12-06 ENCOUNTER — Other Ambulatory Visit: Payer: Self-pay | Admitting: Internal Medicine

## 2022-12-06 MED ORDER — OZEMPIC (0.25 OR 0.5 MG/DOSE) 2 MG/1.5ML ~~LOC~~ SOPN
0.5000 mg | PEN_INJECTOR | SUBCUTANEOUS | 0 refills | Status: DC
Start: 2022-12-06 — End: 2023-09-19

## 2022-12-07 ENCOUNTER — Other Ambulatory Visit: Payer: Self-pay | Admitting: Internal Medicine

## 2022-12-07 NOTE — Telephone Encounter (Signed)
Copied from [place] [phone]. Topic: General - Other >> Dec 07, 2022  1:35 PM Everette C wrote: Reason for CRM: Medication Refill - Medication: OZEMPIC, 0.25 OR 0.5 MG/DOSE, 2 MG/1.5ML SOPN [[id number]]  Has the patient contacted their pharmacy? Yes.  The patient has been told that the previous prescription was incomplete.  (Agent: If no, request that the patient contact the pharmacy for the refill. If patient does not wish to contact the pharmacy document the reason why and proceed with request.) (Agent: If yes, when and what did the pharmacy advise?)  Preferred Pharmacy (with phone number or street name): [id number] [place] [address] [id number]: [phone]: [phone]: Not open 24 hours   Has the patient been seen for an appointment in the last year OR does the patient have an upcoming appointment? Yes.    Agent: Please be advised that RX refills may take up to 3 business days. We ask that you follow-up with your pharmacy.

## 2022-12-07 NOTE — Telephone Encounter (Signed)
Refilled 12/06/2022  - confirmed by same pharmacy. Requested Prescriptions  Pending Prescriptions Disp Refills   OZEMPIC, 0.25 OR 0.5 MG/DOSE, 2 MG/1.5ML SOPN 4.5 mL 0    Sig: Inject 0.5 mg into the skin once a week.     Endocrinology:  Diabetes - GLP-1 Receptor Agonists - semaglutide Failed - 12/07/2022  2:07 PM      Failed - HBA1C in normal range and within 180 days    Hgb A1c MFr Bld  Date Value Ref Range Status  06/25/2022 7.4 (H) <5.7 % of total Hgb Final    Comment:    For someone without known diabetes, a hemoglobin A1c value of 6.5% or greater indicates that they may have  diabetes and this should be confirmed with a follow-up  test. . For someone with known diabetes, a value <7% indicates  that their diabetes is well controlled and a value  greater than or equal to 7% indicates suboptimal  control. A1c targets should be individualized based on  duration of diabetes, age, comorbid conditions, and  other considerations. . Currently, no consensus exists regarding use of hemoglobin A1c for diagnosis of diabetes for children. .          Passed - Cr in normal range and within 360 days    Creat  Date Value Ref Range Status  06/25/2022 0.79 0.50 - 1.03 mg/dL Final   Creatinine, Urine  Date Value Ref Range Status  06/25/2022 240 20 - 275 mg/dL Final         Passed - Valid encounter within last 6 months    Recent Outpatient Visits           5 months ago Encounter for general adult medical examination with abnormal findings   [facility] [place], Coralie Keens, NP   1 year ago Type 2 diabetes mellitus without complication, without long-term current use of insulin [facility])   [facility] [place], Coralie Keens, NP   1 year ago Encounter for general adult medical examination with abnormal findings   [facility] [place], Coralie Keens, NP

## 2023-01-26 ENCOUNTER — Other Ambulatory Visit: Payer: Self-pay | Admitting: Internal Medicine

## 2023-01-26 ENCOUNTER — Other Ambulatory Visit (HOSPITAL_BASED_OUTPATIENT_CLINIC_OR_DEPARTMENT_OTHER): Payer: Self-pay | Admitting: Family

## 2023-01-26 DIAGNOSIS — I10 Essential (primary) hypertension: Secondary | ICD-10-CM

## 2023-01-26 DIAGNOSIS — Z0000 Encounter for general adult medical examination without abnormal findings: Secondary | ICD-10-CM

## 2023-01-28 NOTE — Telephone Encounter (Signed)
Please advise if OK to refill or defer to PCP until seen in office. Patient last seen 05/2021 by Dr. Garen Lah and advised to F/U in 3 months. Saw Dr. Quentin Ore at that time and was again recommended for 3 month F/U. Patient has scheduled appt with Dr. Quentin Ore 02/20/23. Thank you!

## 2023-01-28 NOTE — Telephone Encounter (Signed)
Patient needs OV, will refill medication until OV can be made.  Requested Prescriptions  Pending Prescriptions Disp Refills   losartan (COZAAR) 100 MG tablet [Pharmacy Med Name: LOSARTAN POTASSIUM 100 MG TAB] 90 tablet 0    Sig: TAKE 1 TABLET BY MOUTH EVERY DAY     Cardiovascular:  Angiotensin Receptor Blockers Failed - 01/26/2023  1:03 AM      Failed - Cr in normal range and within 180 days    Creat  Date Value Ref Range Status  06/25/2022 0.79 0.50 - 1.03 mg/dL Final   Creatinine, Urine  Date Value Ref Range Status  06/25/2022 240 20 - 275 mg/dL Final         Failed - K in normal range and within 180 days    Potassium  Date Value Ref Range Status  06/25/2022 4.6 3.5 - 5.3 mmol/L Final         Failed - Valid encounter within last 6 months    Recent Outpatient Visits           7 months ago Encounter for general adult medical examination with abnormal findings   [facility] [place], Coralie Keens, NP   1 year ago Type 2 diabetes mellitus without complication, without long-term current use of insulin [facility])   [facility] [place], [place], NP   1 year ago Encounter for general adult medical examination with abnormal findings   [facility] [place], Coralie Keens, NP       Future Appointments             In 3 weeks Vickie Epley, MD [place] at [facility] - Patient is not pregnant      Passed - Last BP in normal range    BP Readings from Last 1 Encounters:  06/25/22 132/88          metFORMIN (GLUCOPHAGE) 500 MG tablet [Pharmacy Med Name: METFORMIN HCL 500 MG TABLET] 180 tablet 0    Sig: TAKE 1 TABLET BY MOUTH 2 TIMES DAILY WITH A MEAL.     Endocrinology:  Diabetes - Biguanides Failed - 01/26/2023  1:03 AM      Failed - HBA1C is between 0 and 7.9 and within 180 days    Hgb A1c MFr Bld  Date Value Ref Range Status  06/25/2022 7.4 (H) <5.7 % of total Hgb Final     Comment:    For someone without known diabetes, a hemoglobin A1c value of 6.5% or greater indicates that they may have  diabetes and this should be confirmed with a follow-up  test. . For someone with known diabetes, a value <7% indicates  that their diabetes is well controlled and a value  greater than or equal to 7% indicates suboptimal  control. A1c targets should be individualized based on  duration of diabetes, age, comorbid conditions, and  other considerations. . Currently, no consensus exists regarding use of hemoglobin A1c for diagnosis of diabetes for children. .          Failed - B12 Level in normal range and within 720 days    Vitamin B-12  Date Value Ref Range Status  06/22/2015 188 (L) 211 - 911 pg/mL Final         Failed - Valid encounter within last 6 months    Recent Outpatient Visits  7 months ago Encounter for general adult medical examination with abnormal findings   [facility] [place], Coralie Keens, NP   1 year ago Type 2 diabetes mellitus without complication, without long-term current use of insulin [facility])   [facility] [place], [place], NP   1 year ago Encounter for general adult medical examination with abnormal findings   [facility] [place], Coralie Keens, NP       Future Appointments             In 3 weeks Vickie Epley, MD [place] at [facility]            Failed - CBC within normal limits and completed in the last 12 months    WBC  Date Value Ref Range Status  06/25/2022 7.8 3.8 - 10.8 Thousand/uL Final   RBC  Date Value Ref Range Status  06/25/2022 4.85 3.80 - 5.10 Million/uL Final   Hemoglobin  Date Value Ref Range Status  06/25/2022 14.9 11.7 - 15.5 g/dL Final  02/09/2021 14.7 11.1 - 15.9 g/dL Final   HCT  Date Value Ref Range Status  06/25/2022 43.8 35.0 - 45.0 % Final   Hematocrit  Date Value Ref Range Status  02/09/2021  42.0 34.0 - 46.6 % Final   MCHC  Date Value Ref Range Status  06/25/2022 34.0 32.0 - 36.0 g/dL Final   [facility]  Date Value Ref Range Status  06/25/2022 30.7 27.0 - 33.0 pg Final   MCV  Date Value Ref Range Status  06/25/2022 90.3 80.0 - 100.0 fL Final  02/09/2021 92 79 - 97 fL Final   No results found for: "PLTCOUNTKUC", "LABPLAT", "POCPLA" RDW  Date Value Ref Range Status  06/25/2022 12.0 11.0 - 15.0 % Final  02/09/2021 11.9 11.7 - 15.4 % Final         Passed - Cr in normal range and within 360 days    Creat  Date Value Ref Range Status  06/25/2022 0.79 0.50 - 1.03 mg/dL Final   Creatinine, Urine  Date Value Ref Range Status  06/25/2022 240 20 - 275 mg/dL Final         Passed - eGFR in normal range and within 360 days    GFR calc Af Amer  Date Value Ref Range Status  02/09/2021 118 >59 mL/min/1.73 Final    Comment:    **In accordance with recommendations from the NKF-ASN Task force,**   [facility] is in the process of updating its eGFR calculation to the   2021 CKD-EPI creatinine equation that estimates kidney function   without a race variable.    GFR, Estimated  Date Value Ref Range Status  05/24/2021 >60 >60 mL/min Final    Comment:    (NOTE) Calculated using the CKD-EPI Creatinine Equation (2021)    GFR  Date Value Ref Range Status  06/22/2015 74.54 >60.00 mL/min Final   eGFR  Date Value Ref Range Status  06/25/2022 88 > OR = 60 mL/min/1.4m Final    Comment:    The eGFR is based on the CKD-EPI 2021 equation. To calculate  the new eGFR from a previous Creatinine or Cystatin C result, go to https://www.kidney.org/professionals/ kdoqi/gfr%5Fcalculator           FLUoxetine (PROZAC) 10 MG capsule [Pharmacy Med Name: FLUOXETINE HCL 10 MG CAPSULE] 90 capsule 0    Sig: TAKE 1 CAPSULE BY MOUTH EVERY DAY  Psychiatry:  Antidepressants - SSRI Failed - 01/26/2023  1:03 AM      Failed - Valid encounter within last 6 months    Recent Outpatient Visits            7 months ago Encounter for general adult medical examination with abnormal findings   [facility] [place], [place], NP   1 year ago Type 2 diabetes mellitus without complication, without long-term current use of insulin [facility])   [facility] [place], Coralie Keens, NP   1 year ago Encounter for general adult medical examination with abnormal findings   [facility] [place], Coralie Keens, NP       Future Appointments             In 3 weeks Vickie Epley, MD [place] at [facility]             rosuvastatin ([place]) 10 MG tablet [Pharmacy Med Name: ROSUVASTATIN CALCIUM 10 MG TAB] 90 tablet 0    Sig: TAKE 1 TABLET BY MOUTH EVERY DAY     Cardiovascular:  Antilipid - Statins 2 Failed - 01/26/2023  1:03 AM      Failed - Lipid Panel in normal range within the last 12 months    Cholesterol  Date Value Ref Range Status  06/25/2022 223 (H) <200 mg/dL Final   LDL Cholesterol (Calc)  Date Value Ref Range Status  06/25/2022 135 (H) mg/dL (calc) Final    Comment:    Reference range: <100 . Desirable range <100 mg/dL for primary prevention;   <70 mg/dL for patients with CHD or diabetic patients  with > or = 2 CHD risk factors. [name] LDL-C is now calculated using the Martin-Hopkins  calculation, which is a validated novel method providing  better accuracy than the Friedewald equation in the  estimation of LDL-C.  Cresenciano Genre et al. Annamaria Helling. 1937;902(40): 2061-2068  (http://education.QuestDiagnostics.com/faq/FAQ164)    Direct LDL  Date Value Ref Range Status  06/22/2015 96.0 mg/dL Final    Comment:    Optimal:  <100 mg/dLNear or Above Optimal:  100-129 mg/dLBorderline High:  130-159 mg/dLHigh:  160-189 mg/dLVery High:  >190 mg/dL   HDL  Date Value Ref Range Status  06/25/2022 49 (L) > OR = 50 mg/dL Final   Triglycerides  Date Value Ref Range Status  06/25/2022 252 (H) <150 mg/dL Final     Comment:    . If a non-fasting specimen was collected, consider repeat triglyceride testing on a fasting specimen if clinically indicated.  Yates Decamp et al. J. of Clin. Lipidol. 9735;3:299-242. [name]          Passed - Cr in normal range and within 360 days    Creat  Date Value Ref Range Status  06/25/2022 0.79 0.50 - 1.03 mg/dL Final   Creatinine, Urine  Date Value Ref Range Status  06/25/2022 240 20 - 275 mg/dL Final         Passed - Patient is not pregnant      Passed - Valid encounter within last 12 months    Recent Outpatient Visits           7 months ago Encounter for general adult medical examination with abnormal findings   [facility] [place], [place], NP   1 year ago Type 2 diabetes mellitus without complication, without long-term current use of insulin [facility])   [facility], Coralie Keens, NP   1 year ago Encounter for general adult medical examination with abnormal findings   [facility] [place], Coralie Keens, NP       Future Appointments             In 3 weeks Vickie Epley, MD [place] at [facility]

## 2023-01-28 NOTE — Telephone Encounter (Signed)
Patient of Dr. Garen Lah. Please review for refill. Thank you!

## 2023-02-19 NOTE — Progress Notes (Unsigned)
  Electrophysiology Office Follow up Visit Note:    Date:  02/20/2023   ID:  Wanda Aguilar, DOB 1966/01/20, MRN [id number]  PCP:  Jearld Fenton, NP  [facility] Cardiologist:  Kate Sable, MD  [facility] Electrophysiologist:  Vickie Epley, MD    Interval History:    Wanda Aguilar is a 57 y.o. female who presents for a follow up visit.   Last seen 08/02/2021 for pAF. At the last appointment we discussed AF treatment options including catheter ablation.   Since I last saw her she has had at least 4 episodes of significant atrial fibrillation.  She tells me in the last few weeks her daughter is moved into her house with her 2 grandchildren that are 37 months old and 57 years old.  That is because a significant amount of stress which is led to a significant increase in the burden of palpitations.  She also feels very anxious.  She tells me that she never followed up about getting a CPAP despite having a positive sleep study. She ran out of her Eliquis recently.      Past medical, surgical, social and family history were reviewed.  ROS:   Please see the history of present illness.    All other systems reviewed and are negative.  EKGs/Labs/Other Studies Reviewed:    The following studies were reviewed today:     Physical Exam:    VS:  BP 110/64   Pulse 70   Ht 5' 3"$  (1.6 m)   Wt 195 lb (88.5 kg)   LMP 03/22/2014   SpO2 97%   BMI 34.54 kg/m     Wt Readings from Last 3 Encounters:  02/20/23 195 lb (88.5 kg)  06/25/22 194 lb (88 kg)  09/05/21 197 lb 6.4 oz (89.5 kg)     GEN:  Well nourished, well developed in no acute distress CARDIAC: RRR, no murmurs, rubs, gallops RESPIRATORY:  Clear to auscultation without rales, wheezing or rhonchi       ASSESSMENT:    1. Paroxysmal atrial fibrillation (HCC)   2. Essential hypertension    PLAN:    In order of problems listed above:  #pAF On eliquis for stroke ppx but has ran out recently.  I will  resend this to the pharmacy so she can restart for stroke prophylaxis. 2-week ZIO monitor to assess the burden atrial fibrillation given her history above. I will plan to see her back in about 6 weeks to review the results and to determine next steps  #Obstructive sleep apnea Positive sleep study in the past.  Did not get CPAP.  I will send a message to the sleep physicians regarding how she can proceed with CPAP.   #Hypertension At goal today.  Recommend checking blood pressures 1-2 times per week at home and recording the values.  Recommend bringing these recordings to the primary care physician.   Follow-up 6 weeks with me     Signed, Lars Mage, MD, [facility], [facility] 02/20/2023 8:33 AM    Electrophysiology  [facility]

## 2023-02-20 ENCOUNTER — Ambulatory Visit: Payer: No Typology Code available for payment source | Attending: Cardiology | Admitting: Cardiology

## 2023-02-20 ENCOUNTER — Ambulatory Visit: Payer: No Typology Code available for payment source

## 2023-02-20 ENCOUNTER — Other Ambulatory Visit
Admission: RE | Admit: 2023-02-20 | Discharge: 2023-02-20 | Disposition: A | Discharge status: Home / Self Care | Payer: No Typology Code available for payment source | Attending: Cardiology | Admitting: Cardiology

## 2023-02-20 ENCOUNTER — Encounter: Payer: Self-pay | Admitting: Cardiology

## 2023-02-20 VITALS — BP 110/64 | HR 70 | Ht 63.0 in | Wt 195.0 lb

## 2023-02-20 DIAGNOSIS — Z7901 Long term (current) use of anticoagulants: Secondary | ICD-10-CM | POA: Diagnosis present

## 2023-02-20 DIAGNOSIS — I10 Essential (primary) hypertension: Secondary | ICD-10-CM

## 2023-02-20 DIAGNOSIS — I480 Paroxysmal atrial fibrillation: Secondary | ICD-10-CM | POA: Diagnosis present

## 2023-02-20 LAB — BASIC METABOLIC PANEL
Anion gap: 8 (ref 5–15)
BUN: 16 mg/dL (ref 6–20)
CO2: 25 mmol/L (ref 22–32)
Calcium: 9.5 mg/dL (ref 8.9–10.3)
Chloride: 102 mmol/L (ref 98–111)
Creatinine, Ser: 0.63 mg/dL (ref 0.44–1.00)
GFR, Estimated: >60 mL/min (ref >60)
Glucose, Bld: 201 mg/dL — ABNORMAL HIGH (ref 70–99)
Potassium: 3.8 mmol/L (ref 3.5–5.1)
Sodium: 135 mmol/L (ref 135–145)

## 2023-02-20 LAB — CBC
HCT: 41.2 % (ref 36.0–46.0)
Hemoglobin: 14.4 g/dL (ref 12.0–15.0)
MCH: 30.6 pg (ref 26.0–34.0)
MCHC: 35 g/dL (ref 30.0–36.0)
MCV: 87.7 fL (ref 80.0–100.0)
Platelets: 255 10*3/uL (ref 150–400)
RBC: 4.7 MIL/uL (ref 3.87–5.11)
RDW: 11.5 % (ref 11.5–15.5)
WBC: 6.8 10*3/uL (ref 4.0–10.5)
nRBC: 0 % (ref 0.0–0.2)

## 2023-02-20 MED ORDER — DILTIAZEM HCL 30 MG PO TABS: ORAL_TABLET | ORAL | 5 refills | Status: DC

## 2023-02-20 MED ORDER — DILTIAZEM HCL ER BEADS 360 MG PO CP24: 360.0000 mg | ORAL_CAPSULE | Freq: Every day | ORAL | 3 refills | Status: DC

## 2023-02-20 MED ORDER — APIXABAN 5 MG PO TABS: 5.0000 mg | ORAL_TABLET | Freq: Two times a day (BID) | ORAL | 5 refills | Status: DC

## 2023-02-20 NOTE — Patient Instructions (Signed)
Please reach out to your primary care provider for an appointment.   Medication Instructions:  Your physician recommends that you continue on your current medications as directed. Please refer to the Current Medication list given to you today.  *If you need a refill on your cardiac medications before your next appointment, please call your pharmacy*  Lab Work: TODAY: CBC, BMET If you have labs (blood work) drawn today and your tests are completely normal, you will receive your results only by: [facility] (if you have MyChart) OR A paper copy in the mail If you have any lab test that is abnormal or we need to change your treatment, we will call you to review the results.  Testing/Procedures: Your physician has recommended that you wear an event monitor. Event monitors are medical devices that record the heart's electrical activity. Doctors most often [place] these monitors to diagnose arrhythmias. Arrhythmias are problems with the speed or rhythm of the heartbeat. The monitor is a small, portable device. You can wear one while you do your normal daily activities. This is usually used to diagnose what is causing palpitations/syncope (passing out).   Follow-Up: At [facility], you and your health needs are our priority.  As part of our continuing mission to provide you with exceptional heart care, we have created designated Provider Care Teams.  These Care Teams include your primary Cardiologist (physician) and Advanced Practice Providers (APPs -  Physician Assistants and Nurse Practitioners) who all work together to provide you with the care you need, when you need it.   Your next appointment:   6 week(s)  Provider:   You may see Vickie Epley, MD or one of the following Advanced Practice Providers on your designated Care Team:   Tommye Standard, [place] Legrand Como "Jonni Sanger" [place], [place] Mamie Levers, NP    Other Instructions [place] Monitor Instructions  Your physician  has requested you wear a ZIO patch monitor for 14 days.  This is a single patch monitor. [employer] supplies one patch monitor per enrollment. Additional stickers are not available. Please do not apply patch if you will be having a Nuclear Stress Test,  Echocardiogram, Cardiac CT, MRI, or Chest Xray during the period you would be wearing the  monitor. The patch cannot be worn during these tests. You cannot remove and re-apply the  ZIO XT patch monitor.  Your ZIO patch monitor will be mailed 3 day USPS to your address on file. It may take 3-5 days  to receive your monitor after you have been enrolled.  Once you have received your monitor, please review the enclosed instructions. Your monitor  has already been registered assigning a specific monitor serial # to you.  Billing and Patient Assistance Program Information  We have supplied [employer] with any of your insurance information on file for billing purposes. [employer] offers a sliding scale Patient Assistance Program for patients that do not have  insurance, or whose insurance does not completely cover the cost of the ZIO monitor.  You must apply for the Patient Assistance Program to qualify for this discounted rate.  To apply, please call [employer] at [phone], select option 4, select option 2, ask to apply for  Patient Assistance Program. Theodore Demark will ask your household income, and how many people  are in your household. They will quote your out-of-pocket cost based on that information.  [employer] will also be able to set up a 62-month interest-free payment plan if needed.  Applying the monitor  Shave hair from upper left chest.  Hold abrader disc by orange tab. Rub abrader in 40 strokes over the upper left chest as  indicated in your monitor instructions.  Clean area with 4 enclosed alcohol pads. Let dry.  Apply patch as indicated in monitor instructions. Patch will be placed under collarbone on left  side of chest with arrow pointing  upward.  Rub patch adhesive wings for 2 minutes. Remove white label marked "1". Remove the white  label marked "2". Rub patch adhesive wings for 2 additional minutes.  While looking in a mirror, press and release button in center of patch. A small green light will  flash 3-4 times. This will be your only indicator that the monitor has been turned on.  Do not shower for the first 24 hours. You may shower after the first 24 hours.  Press the button if you feel a symptom. You will hear a small click. Record Date, Time and  Symptom in the Patient Logbook.  When you are ready to remove the patch, follow instructions on the last 2 pages of Patient  Logbook. Stick patch monitor onto the last page of Patient Logbook.  Place Patient Logbook in the blue and white box. Use locking tab on box and tape box closed  securely. The blue and white box has prepaid postage on it. Please place it in the mailbox as  soon as possible. Your physician should have your test results approximately 7 days after the  monitor has been mailed back to [facility].  Call [place] at [phone] if you have questions regarding  your ZIO XT patch monitor. Call them immediately if you see an orange light blinking on your  monitor.  If your monitor falls off in less than 4 days, contact our Monitor department at [phone].  If your monitor becomes loose or falls off after 4 days call [employer] at [phone] for  suggestions on securing your monitor

## 2023-02-25 DIAGNOSIS — I480 Paroxysmal atrial fibrillation: Secondary | ICD-10-CM | POA: Diagnosis not present

## 2023-02-25 DIAGNOSIS — Z7901 Long term (current) use of anticoagulants: Secondary | ICD-10-CM | POA: Diagnosis not present

## 2023-02-25 DIAGNOSIS — I10 Essential (primary) hypertension: Secondary | ICD-10-CM

## 2023-03-08 ENCOUNTER — Ambulatory Visit: Payer: No Typology Code available for payment source | Admitting: Physician Assistant

## 2023-03-08 ENCOUNTER — Encounter: Payer: Self-pay | Admitting: Physician Assistant

## 2023-03-08 VITALS — BP 158/98 | HR 93 | Temp 96.2°F | Wt 185.0 lb

## 2023-03-08 DIAGNOSIS — B9689 Other specified bacterial agents as the cause of diseases classified elsewhere: Secondary | ICD-10-CM | POA: Diagnosis not present

## 2023-03-08 DIAGNOSIS — R0689 Other abnormalities of breathing: Secondary | ICD-10-CM

## 2023-03-08 DIAGNOSIS — J019 Acute sinusitis, unspecified: Secondary | ICD-10-CM

## 2023-03-08 MED ORDER — PREDNISONE 20 MG PO TABS: ORAL_TABLET | ORAL | 0 refills | Status: DC

## 2023-03-08 MED ORDER — ONDANSETRON HCL 4 MG PO TABS: 4.0000 mg | ORAL_TABLET | Freq: Three times a day (TID) | ORAL | 0 refills | Status: DC | PRN

## 2023-03-08 MED ORDER — AMOXICILLIN-POT CLAVULANATE 875-125 MG PO TABS: 1.0000 | ORAL_TABLET | Freq: Two times a day (BID) | ORAL | 0 refills | Status: DC

## 2023-03-08 NOTE — Progress Notes (Signed)
Acute Office Visit   Patient: Wanda Aguilar   DOB: 1966/03/24   57 y.o. Female  MRN: [id number] Visit Date: 03/08/2023  Today's healthcare provider: Dani Gobble Matty Vanroekel, PA-C  Introduced myself to the patient as a Journalist, newspaper and provided education on APPs in clinical practice.    Chief Complaint  Patient presents with   Cough   Sinusitis   Subjective    Cough Associated symptoms include chills, ear pain, a fever, headaches, myalgias, postnasal drip and a sore throat.  Sinusitis Associated symptoms include chills, congestion, coughing, ear pain, headaches, sinus pressure and a sore throat.     URI-type symptoms States about a month ago she started having cold symptoms She states she started feeling a little bit better but then current symptoms started and have been getting worse  Onset: gradual  Duration: about 2 weeks with current symptoms  Associated symptoms: SOB, sinus congestion, productive coughing, sinus pressure and pain, body aches, fever, reduced appetite  She reports she is having trouble sleeping due to trouble breathing and mucus production  Fever: yes Wed had fever of 102 but this improved  SOB: yes   Interventions: OTC alkaseltzer cold and flu   Recent sick contacts: she works in an English as a second language teacher. Her daughter and grandchildren have recently moved in and the baby has had several illnesses lately  COVID testing at home: She has not tested at home       Medications: Outpatient Medications Prior to Visit  Medication Sig   albuterol (VENTOLIN HFA) 108 (90 Base) MCG/ACT inhaler Inhale 1 puff into the lungs every 6 (six) hours as needed for wheezing or shortness of breath.   apixaban (ELIQUIS) 5 MG TABS tablet Take 1 tablet (5 mg total) by mouth 2 (two) times daily.   diclofenac (VOLTAREN) 50 MG EC tablet Take 50 mg by mouth as needed.   diltiazem (CARDIZEM) 30 MG tablet Take 1 tablet by mouth up to 3X a day as needed for a heart rate of 110 or greater.    diltiazem (TIADYLT ER) 360 MG 24 hr capsule Take 1 capsule (360 mg total) by mouth daily.   hydrochlorothiazide (HYDRODIURIL) 25 MG tablet Take 1 tablet (25 mg total) by mouth daily.   Insulin Pen Needle 31G X 5 MM MISC BD Pen Needles- brand specific Inject insulin via insulin pen 6 x daily   losartan (COZAAR) 100 MG tablet TAKE 1 TABLET BY MOUTH EVERY DAY   metFORMIN (GLUCOPHAGE) 500 MG tablet TAKE 1 TABLET BY MOUTH 2 TIMES DAILY WITH A MEAL.   omeprazole (PRILOSEC) 20 MG capsule Take 1 capsule (20 mg total) by mouth daily.   OZEMPIC, 0.25 OR 0.5 MG/DOSE, 2 MG/1.5ML SOPN Inject 0.5 mg into the skin once a week.   rosuvastatin (CRESTOR) 10 MG tablet TAKE 1 TABLET BY MOUTH EVERY DAY   FLUoxetine (PROZAC) 10 MG capsule TAKE 1 CAPSULE BY MOUTH EVERY DAY (Patient not taking: Reported on 03/08/2023)   No facility-administered medications prior to visit.    Review of Systems  Constitutional:  Positive for chills and fever.  HENT:  Positive for congestion, ear pain, postnasal drip, sinus pressure, sinus pain and sore throat.   Respiratory:  Positive for cough.   Gastrointestinal:  Positive for nausea. Negative for diarrhea and vomiting.  Musculoskeletal:  Positive for myalgias.  Neurological:  Positive for weakness, light-headedness and headaches.       Objective    BP [name])  158/98 (BP Location: Left Arm, Patient Position: Sitting, Cuff Size: Normal)   Pulse 93   Temp (!) 96.2 F (35.7 C) (Temporal)   Wt 185 lb (83.9 kg)   LMP 03/22/2014   SpO2 99%   BMI 32.77 kg/m    Physical Exam Vitals reviewed.  Constitutional:      General: She is awake.     Appearance: Normal appearance. She is well-developed and well-groomed.  HENT:     Head: Normocephalic and atraumatic.     Right Ear: Hearing and ear canal normal. A middle ear effusion is present. Tympanic membrane is bulging.     Left Ear: Hearing and ear canal normal. A middle ear effusion is present. Tympanic membrane is bulging.      Nose: Congestion and rhinorrhea present. Rhinorrhea is clear.     Right Sinus: No maxillary sinus tenderness or frontal sinus tenderness.     Left Sinus: No maxillary sinus tenderness or frontal sinus tenderness.     Mouth/Throat:     Lips: Pink.     Mouth: Mucous membranes are moist.     Pharynx: Oropharynx is clear. Uvula midline.  Eyes:     General: Lids are normal. Gaze aligned appropriately. Allergic shiner present.     Extraocular Movements: Extraocular movements intact.     Conjunctiva/sclera: Conjunctivae normal.     Pupils: Pupils are equal, round, and reactive to light.  Cardiovascular:     Rate and Rhythm: Normal rate and regular rhythm.     Heart sounds: Normal heart sounds. No murmur heard.    No friction rub. No gallop.  Pulmonary:     Breath sounds: Examination of the left-middle field reveals decreased breath sounds. Examination of the left-lower field reveals decreased breath sounds. Decreased breath sounds present. No wheezing, rhonchi or rales.  Musculoskeletal:     Cervical back: Normal range of motion.  Lymphadenopathy:     Head:     Right side of head: Submandibular adenopathy present. No submental adenopathy.     Left side of head: No submental or submandibular adenopathy.     Cervical:     Right cervical: No superficial or posterior cervical adenopathy.    Left cervical: No superficial or posterior cervical adenopathy.     Upper Body:     Right upper body: No supraclavicular adenopathy.     Left upper body: No supraclavicular adenopathy.  Neurological:     Mental Status: She is alert.  Psychiatric:        Attention and Perception: Attention and perception normal.        Mood and Affect: Mood is anxious. Affect is tearful.        Speech: Speech normal.        Behavior: Behavior normal. Behavior is cooperative.     Comments: Patient was mildly tearful and anxious  She expressed increasing worry over her symptoms and fatigue        No results found for  any visits on 03/08/23.  Assessment & Plan      No follow-ups on file.      Problem List Items Addressed This Visit   None Visit Diagnoses     Acute bacterial sinusitis    -  Primary Acute, new concern She reports ongoing sinus pressure, congestion and pain with intermittent fevers, productive coughing and body aches for the past two weeks Flu and COVID testing in office was negative  I suspect bacterial sinusitis at this time but I would like  to obtain a CXR to rule out pulmonary involvement given reduced breath sounds in left lung base Will start Augmentin PO BID x 10 days along with Prednisone to assist with breathing,  I have sent in script for Zofran to assist with nausea Will review CXR once available and if pneumonia seems likely will send in [place] for added coverage Reviewed ED and return precautions Follow up as needed for persistent or progressing symptoms   Relevant Medications   ondansetron (ZOFRAN) 4 MG tablet   amoxicillin-clavulanate (AUGMENTIN) 875-125 MG tablet   predniSONE (DELTASONE) 20 MG tablet   Decreased breath sounds at left lung base       Relevant Orders   DG Chest 2 View        No follow-ups on file.   I, Terilynn Buresh E Arias Weinert, PA-C, have reviewed all documentation for this visit. The documentation on 03/08/23 for the exam, diagnosis, procedures, and orders are all accurate and complete.   Talitha Givens, MHS, PA-C [facility]

## 2023-03-08 NOTE — Patient Instructions (Addendum)
Please go here for your  xray   [address], [place] [id number]   At this time I think you may have a bacterial sinus infection but I would like to make sure your lungs are clear too  Based on your symptoms and duration of illness, I believe you may have a bacterial sinus infection  These typically resolve with antibiotic therapy along with at-home comfort measures  Today I have sent in a prescription for Augmentin 875-125 mg to be taken by mouth twice per day for 10 days FINISH THE ENTIRE COURSE unless you develop an allergic reaction or are instructed to discontinue.  I have sent in a script for Prednisone to help with your breathing and Zofran to assist with the nausea. Please stay well hydrated and eat small bland meals to help with your symptoms.   It can take a few days for the antibiotic to kick in so I recommend symptomatic relief with over the counter medication such as the following: Dayquil/ Nyquil Theraflu Alkaseltzer  Coricidin - if you have high blood pressure even if it is well managed with medications  These medications typically have Tylenol in them already so you can take Ibuprofen as needed for further pain/ discomfort and fever management/ do not need to supplement with more outside of those medications  Stay well hydrated with at least 75 oz of water per day to help with recovery  If you notice any of the following please let us know: increased fever not responding to Tylenol or Ibuprofen, swelling around your nose or eyes, difficulty seeing, confusion, loss of consciousness, trouble breathing

## 2023-03-11 ENCOUNTER — Ambulatory Visit
Admission: RE | Admit: 2023-03-11 | Discharge: 2023-03-11 | Disposition: A | Discharge status: Home / Self Care | Payer: No Typology Code available for payment source | Source: Ambulatory Visit | Attending: Physician Assistant | Admitting: Physician Assistant

## 2023-03-11 DIAGNOSIS — R0689 Other abnormalities of breathing: Secondary | ICD-10-CM

## 2023-03-11 NOTE — Progress Notes (Signed)
Chest xray did not show signs of pneumonia or other pulmonary disease. Please continue with the medications you were provided with at your apt.

## 2023-03-12 ENCOUNTER — Encounter: Payer: Self-pay | Admitting: Internal Medicine

## 2023-03-13 ENCOUNTER — Other Ambulatory Visit: Payer: Self-pay | Admitting: Physician Assistant

## 2023-03-13 DIAGNOSIS — B9689 Other specified bacterial agents as the cause of diseases classified elsewhere: Secondary | ICD-10-CM

## 2023-03-13 MED ORDER — GUAIFENESIN-CODEINE 100-10 MG/5ML PO SOLN: 5.0000 mL | Freq: Two times a day (BID) | ORAL | 0 refills | Status: DC | PRN

## 2023-03-28 ENCOUNTER — Telehealth: Payer: Self-pay | Admitting: Cardiology

## 2023-03-28 NOTE — Telephone Encounter (Signed)
Email notification received from [employer] stating the patient' heart monitor, ordered 02/20/23 by Dr. Quentin Ore is still pending return.  Serial #: [id number]  To Dr. Mardene Speak nurse as an Juluis Rainier.

## 2023-04-01 NOTE — Telephone Encounter (Signed)
Left message for patient to call back  

## 2023-04-03 ENCOUNTER — Ambulatory Visit: Payer: No Typology Code available for payment source | Admitting: Cardiology

## 2023-04-03 NOTE — Telephone Encounter (Signed)
Left message for patient to call back  

## 2023-06-03 ENCOUNTER — Other Ambulatory Visit: Payer: Self-pay | Admitting: Physician Assistant

## 2023-06-03 DIAGNOSIS — B9689 Other specified bacterial agents as the cause of diseases classified elsewhere: Secondary | ICD-10-CM

## 2023-06-04 NOTE — Telephone Encounter (Signed)
Requested medications are due for refill today.  yes  Requested medications are on the active medications list.  yes  Last refill. 03/08/2023 #20 0 rf  Future visit scheduled.   no  Notes to clinic.  Refill not delegated    Requested Prescriptions  Pending Prescriptions Disp Refills   ondansetron (ZOFRAN) 4 MG tablet [Pharmacy Med Name: ONDANSETRON HCL 4 MG TABLET] 20 tablet 0    Sig: TAKE 1 TABLET BY MOUTH EVERY 8 HOURS AS NEEDED FOR NAUSEA AND VOMITING     Not Delegated - Gastroenterology: Antiemetics - ondansetron Failed - 06/03/2023  2:43 PM      Failed - This refill cannot be delegated      Failed - Valid encounter within last 6 months    Recent Outpatient Visits           2 months ago Acute bacterial sinusitis   [place] [facility] Mecum, Oswaldo Conroy, PA-C   11 months ago Encounter for general adult medical examination with abnormal findings   [place] [place], [place] W, NP   1 year ago Type 2 diabetes mellitus without complication, without long-term current use of insulin [facility])   [place] [facility] [place], [place], NP   2 years ago Encounter for general adult medical examination with abnormal findings   [place] [facility] [place], [place] W, NP              Passed - AST in normal range and within 360 days    AST  Date Value Ref Range Status  06/25/2022 16 10 - 35 U/L Final         Passed - ALT in normal range and within 360 days    ALT  Date Value Ref Range Status  06/25/2022 28 6 - 29 U/L Final

## 2023-06-25 ENCOUNTER — Other Ambulatory Visit: Payer: Self-pay | Admitting: Internal Medicine

## 2023-06-26 NOTE — Telephone Encounter (Signed)
Requested medication (s) are due for refill today: yes  Requested medication (s) are on the active medication list: yes  Last refill:  12/06/22  Future visit scheduled: no  Notes to clinic:  Unable to refill per protocol due to failed labs, no A1c updated results. Routing for approval.      Requested Prescriptions  Pending Prescriptions Disp Refills   OZEMPIC, 0.25 OR 0.5 MG/DOSE, 2 MG/3ML SOPN [Pharmacy Med Name: OZEMPIC 0.25-0.5 MG/DOSE PEN]  2    Sig: INJECT 0.5 MG INTO THE SKIN ONE TIME PER WEEK     Endocrinology:  Diabetes - GLP-1 Receptor Agonists - semaglutide Failed - 06/25/2023  2:35 AM      Failed - HBA1C in normal range and within 180 days    Hgb A1c MFr Bld  Date Value Ref Range Status  06/25/2022 7.4 (H) <5.7 % of total Hgb Final    Comment:    For someone without known diabetes, a hemoglobin A1c value of 6.5% or greater indicates that they may have  diabetes and this should be confirmed with a follow-up  test. . For someone with known diabetes, a value <7% indicates  that their diabetes is well controlled and a value  greater than or equal to 7% indicates suboptimal  control. A1c targets should be individualized based on  duration of diabetes, age, comorbid conditions, and  other considerations. . Currently, no consensus exists regarding use of hemoglobin A1c for diagnosis of diabetes for children. .          Failed - Valid encounter within last 6 months    Recent Outpatient Visits           3 months ago Acute bacterial sinusitis   [place] [facility] Mecum, Oswaldo Conroy, PA-C   1 year ago Encounter for general adult medical examination with abnormal findings   [place] [facility] [place], [place] W, NP   1 year ago Type 2 diabetes mellitus without complication, without long-term current use of insulin [facility])   [place] [facility] [place], Salvadore Oxford, NP   2 years ago Encounter for general adult medical  examination with abnormal findings   [place] [facility] [place], [place], NP              Passed - Cr in normal range and within 360 days    Creat  Date Value Ref Range Status  06/25/2022 0.79 0.50 - 1.03 mg/dL Final   Creatinine, Ser  Date Value Ref Range Status  02/20/2023 0.63 0.44 - 1.00 mg/dL Final   Creatinine, Urine  Date Value Ref Range Status  06/25/2022 240 20 - 275 mg/dL Final

## 2023-07-08 ENCOUNTER — Other Ambulatory Visit: Payer: Self-pay | Admitting: Internal Medicine

## 2023-07-08 DIAGNOSIS — Z0000 Encounter for general adult medical examination without abnormal findings: Secondary | ICD-10-CM

## 2023-07-08 DIAGNOSIS — I10 Essential (primary) hypertension: Secondary | ICD-10-CM

## 2023-07-09 NOTE — Telephone Encounter (Signed)
Requested Prescriptions  Pending Prescriptions Disp Refills   losartan (COZAAR) 100 MG tablet [Pharmacy Med Name: LOSARTAN POTASSIUM 100 MG TAB] 30 tablet 0    Sig: TAKE 1 TABLET BY MOUTH EVERY DAY     Cardiovascular:  Angiotensin Receptor Blockers Failed - 07/08/2023  4:10 PM      Failed - Last BP in normal range    BP Readings from Last 1 Encounters:  03/08/23 (!) 158/98         Failed - Valid encounter within last 6 months    Recent Outpatient Visits           4 months ago Acute bacterial sinusitis   [place] [facility] Mecum, Oswaldo Conroy, PA-C   1 year ago Encounter for general adult medical examination with abnormal findings   [place] [facility] [place], [place] W, NP   1 year ago Type 2 diabetes mellitus without complication, without long-term current use of insulin [facility])   [place] [facility] [place], [place] W, NP   2 years ago Encounter for general adult medical examination with abnormal findings    [facility] [place], Salvadore Oxford, NP              Passed - Cr in normal range and within 180 days    Creat  Date Value Ref Range Status  06/25/2022 0.79 0.50 - 1.03 mg/dL Final   Creatinine, Ser  Date Value Ref Range Status  02/20/2023 0.63 0.44 - 1.00 mg/dL Final   Creatinine, Urine  Date Value Ref Range Status  06/25/2022 240 20 - 275 mg/dL Final         Passed - K in normal range and within 180 days    Potassium  Date Value Ref Range Status  02/20/2023 3.8 3.5 - 5.1 mmol/L Final         Passed - Patient is not pregnant

## 2023-08-07 ENCOUNTER — Other Ambulatory Visit: Payer: Self-pay | Admitting: Internal Medicine

## 2023-08-07 DIAGNOSIS — Z0000 Encounter for general adult medical examination without abnormal findings: Secondary | ICD-10-CM

## 2023-08-07 DIAGNOSIS — I10 Essential (primary) hypertension: Secondary | ICD-10-CM

## 2023-08-08 ENCOUNTER — Other Ambulatory Visit: Payer: Self-pay | Admitting: Obstetrics and Gynecology

## 2023-08-08 DIAGNOSIS — Z720 Tobacco use: Secondary | ICD-10-CM

## 2023-08-08 LAB — HM PAP SMEAR: HPV, high-risk: NEGATIVE

## 2023-08-08 LAB — RESULTS CONSOLE HPV: CHL HPV: NEGATIVE

## 2023-08-08 LAB — HM MAMMOGRAPHY

## 2023-08-08 NOTE — Telephone Encounter (Signed)
Last RF-courtesy- needs appointment Requested Prescriptions  Pending Prescriptions Disp Refills   losartan (COZAAR) 100 MG tablet [Pharmacy Med Name: LOSARTAN POTASSIUM 100 MG TAB] 90 tablet 1    Sig: TAKE 1 TABLET BY MOUTH EVERY DAY     Cardiovascular:  Angiotensin Receptor Blockers Failed - 08/07/2023  1:30 PM      Failed - Last BP in normal range    BP Readings from Last 1 Encounters:  03/08/23 (!) 158/98         Failed - Valid encounter within last 6 months    Recent Outpatient Visits           5 months ago Acute bacterial sinusitis   [facility], Oswaldo Conroy, PA-C   1 year ago Encounter for general adult medical examination with abnormal findings   [place] [facility] [place], [place] W, NP   1 year ago Type 2 diabetes mellitus without complication, without long-term current use of insulin [facility])   [place] [facility] [place], [place] W, NP   2 years ago Encounter for general adult medical examination with abnormal findings   [place] [facility] [place], Salvadore Oxford, NP              Passed - Cr in normal range and within 180 days    Creat  Date Value Ref Range Status  06/25/2022 0.79 0.50 - 1.03 mg/dL Final   Creatinine, Ser  Date Value Ref Range Status  02/20/2023 0.63 0.44 - 1.00 mg/dL Final   Creatinine, Urine  Date Value Ref Range Status  06/25/2022 240 20 - 275 mg/dL Final         Passed - K in normal range and within 180 days    Potassium  Date Value Ref Range Status  02/20/2023 3.8 3.5 - 5.1 mmol/L Final         Passed - Patient is not pregnant

## 2023-09-01 ENCOUNTER — Other Ambulatory Visit: Payer: Self-pay | Admitting: Internal Medicine

## 2023-09-03 NOTE — Telephone Encounter (Signed)
Courtesy refill. Patient will need an office visit for further refills. Requested Prescriptions  Pending Prescriptions Disp Refills   hydrochlorothiazide (HYDRODIURIL) 25 MG tablet [Pharmacy Med Name: HYDROCHLOROTHIAZIDE 25 MG TAB] 30 tablet 0    Sig: TAKE 1 TABLET (25 MG TOTAL) BY MOUTH DAILY.     Cardiovascular: Diuretics - Thiazide Failed - 09/01/2023  1:18 AM      Failed - Cr in normal range and within 180 days    Creat  Date Value Ref Range Status  06/25/2022 0.79 0.50 - 1.03 mg/dL Final   Creatinine, Ser  Date Value Ref Range Status  02/20/2023 0.63 0.44 - 1.00 mg/dL Final   Creatinine, Urine  Date Value Ref Range Status  06/25/2022 240 20 - 275 mg/dL Final         Failed - K in normal range and within 180 days    Potassium  Date Value Ref Range Status  02/20/2023 3.8 3.5 - 5.1 mmol/L Final         Failed - Na in normal range and within 180 days    Sodium  Date Value Ref Range Status  02/20/2023 135 135 - 145 mmol/L Final  02/09/2021 141 134 - 144 mmol/L Final         Failed - Last BP in normal range    BP Readings from Last 1 Encounters:  03/08/23 (!) 158/98         Failed - Valid encounter within last 6 months    Recent Outpatient Visits           5 months ago Acute bacterial sinusitis   [place] [facility] Mecum, Oswaldo Conroy, PA-C   1 year ago Encounter for general adult medical examination with abnormal findings   [place] [facility] [place], Salvadore Oxford, NP   1 year ago Type 2 diabetes mellitus without complication, without long-term current use of insulin [facility])   [place] [facility] [place], Salvadore Oxford, NP   2 years ago Encounter for general adult medical examination with abnormal findings   [place] [facility] [place], Salvadore Oxford, NP

## 2023-09-03 NOTE — Telephone Encounter (Signed)
Called pt to schedule appt. Pt was driving and will call back to schedule appt.

## 2023-09-17 ENCOUNTER — Other Ambulatory Visit: Payer: Self-pay | Admitting: Internal Medicine

## 2023-09-17 NOTE — Telephone Encounter (Signed)
Requested Prescriptions  Pending Prescriptions Disp Refills   hydrochlorothiazide (HYDRODIURIL) 25 MG tablet [Pharmacy Med Name: HYDROCHLOROTHIAZIDE 25 MG TAB] 90 tablet 1    Sig: TAKE 1 TABLET (25 MG TOTAL) BY MOUTH DAILY.     Cardiovascular: Diuretics - Thiazide Failed - 09/17/2023  9:23 AM      Failed - Cr in normal range and within 180 days    Creat  Date Value Ref Range Status  06/25/2022 0.79 0.50 - 1.03 mg/dL Final   Creatinine, Ser  Date Value Ref Range Status  02/20/2023 0.63 0.44 - 1.00 mg/dL Final   Creatinine, Urine  Date Value Ref Range Status  06/25/2022 240 20 - 275 mg/dL Final         Failed - K in normal range and within 180 days    Potassium  Date Value Ref Range Status  02/20/2023 3.8 3.5 - 5.1 mmol/L Final         Failed - Na in normal range and within 180 days    Sodium  Date Value Ref Range Status  02/20/2023 135 135 - 145 mmol/L Final  02/09/2021 141 134 - 144 mmol/L Final         Failed - Last BP in normal range    BP Readings from Last 1 Encounters:  03/08/23 (!) 158/98         Failed - Valid encounter within last 6 months    Recent Outpatient Visits           6 months ago Acute bacterial sinusitis   [place] [facility] Mecum, Oswaldo Conroy, PA-C   1 year ago Encounter for general adult medical examination with abnormal findings   [place] [facility] [place], Salvadore Oxford, NP   2 years ago Type 2 diabetes mellitus without complication, without long-term current use of insulin [facility])   [place] [facility] [place], Salvadore Oxford, NP   2 years ago Encounter for general adult medical examination with abnormal findings   [place] [facility] [place], Salvadore Oxford, NP       Future Appointments             In 2 days [place], Salvadore Oxford, NP [facility]

## 2023-09-19 ENCOUNTER — Ambulatory Visit: Payer: No Typology Code available for payment source | Admitting: Internal Medicine

## 2023-09-19 ENCOUNTER — Encounter: Payer: Self-pay | Admitting: Internal Medicine

## 2023-09-19 VITALS — BP 126/84 | HR 66 | Temp 96.6°F | Ht 63.0 in | Wt 193.0 lb

## 2023-09-19 DIAGNOSIS — I10 Essential (primary) hypertension: Secondary | ICD-10-CM

## 2023-09-19 DIAGNOSIS — F411 Generalized anxiety disorder: Secondary | ICD-10-CM | POA: Diagnosis not present

## 2023-09-19 DIAGNOSIS — E119 Type 2 diabetes mellitus without complications: Secondary | ICD-10-CM | POA: Diagnosis not present

## 2023-09-19 DIAGNOSIS — Z0001 Encounter for general adult medical examination with abnormal findings: Secondary | ICD-10-CM | POA: Diagnosis not present

## 2023-09-19 DIAGNOSIS — E6609 Other obesity due to excess calories: Secondary | ICD-10-CM

## 2023-09-19 DIAGNOSIS — Z6834 Body mass index (BMI) 34.0-34.9, adult: Secondary | ICD-10-CM

## 2023-09-19 LAB — CBC
HCT: 41.7 % (ref 35.0–45.0)
Hemoglobin: 14 g/dL (ref 11.7–15.5)
MCH: 31.6 pg (ref 27.0–33.0)
MCHC: 33.6 g/dL (ref 32.0–36.0)
MCV: 94.1 fL (ref 80.0–100.0)
MPV: 10.1 fL (ref 7.5–12.5)
Platelets: 287 10*3/uL (ref 140–400)
RBC: 4.43 10*6/uL (ref 3.80–5.10)
RDW: 12.7 % (ref 11.0–15.0)
WBC: 6.4 10*3/uL (ref 3.8–10.8)

## 2023-09-19 MED ORDER — OZEMPIC (0.25 OR 0.5 MG/DOSE) 2 MG/1.5ML ~~LOC~~ SOPN: 0.2500 mg | PEN_INJECTOR | SUBCUTANEOUS | 0 refills | Status: DC

## 2023-09-19 MED ORDER — OLMESARTAN-AMLODIPINE-HCTZ 40-10-12.5 MG PO TABS: 1.0000 | ORAL_TABLET | Freq: Every day | ORAL | 1 refills | Status: DC

## 2023-09-19 MED ORDER — VENLAFAXINE HCL ER 75 MG PO CP24: 75.0000 mg | ORAL_CAPSULE | Freq: Every day | ORAL | 1 refills | Status: DC

## 2023-09-19 NOTE — Patient Instructions (Signed)
Health Maintenance for Postmenopausal Women Menopause is a normal process in which your ability to get pregnant comes to an end. This process happens slowly over many months or years, usually between the ages of 48 and 55. Menopause is complete when you have missed your menstrual period for 12 months. It is important to talk with your health care provider about some of the most common conditions that affect women after menopause (postmenopausal women). These include heart disease, cancer, and bone loss (osteoporosis). Adopting a healthy lifestyle and getting preventive care can help to promote your health and wellness. The actions you take can also lower your chances of developing some of these common conditions. What are the signs and symptoms of menopause? During menopause, you may have the following symptoms: Hot flashes. These can be moderate or severe. Night sweats. Decrease in sex drive. Mood swings. Headaches. Tiredness (fatigue). Irritability. Memory problems. Problems falling asleep or staying asleep. Talk with your health care provider about treatment options for your symptoms. Do I need hormone replacement therapy? Hormone replacement therapy is effective in treating symptoms that are caused by menopause, such as hot flashes and night sweats. Hormone replacement carries certain risks, especially as you become older. If you are thinking about using estrogen or estrogen with progestin, discuss the benefits and risks with your health care provider. How can I reduce my risk for heart disease and stroke? The risk of heart disease, heart attack, and stroke increases as you age. One of the causes may be a change in the body's hormones during menopause. This can affect how your body uses dietary fats, triglycerides, and cholesterol. Heart attack and stroke are medical emergencies. There are many things that you can do to help prevent heart disease and stroke. Watch your blood pressure High  blood pressure causes heart disease and increases the risk of stroke. This is more likely to develop in people who have high blood pressure readings or are overweight. Have your blood pressure checked: Every 3-5 years if you are 18-39 years of age. Every year if you are 40 years old or older. Eat a healthy diet  Eat a diet that includes plenty of vegetables, fruits, low-fat dairy products, and lean protein. Do not eat a lot of foods that are high in solid fats, added sugars, or sodium. Get regular exercise Get regular exercise. This is one of the most important things you can do for your health. Most adults should: Try to exercise for at least 150 minutes each week. The exercise should increase your heart rate and make you sweat (moderate-intensity exercise). Try to do strengthening exercises at least twice each week. Do these in addition to the moderate-intensity exercise. Spend less time sitting. Even light physical activity can be beneficial. Other tips Work with your health care provider to achieve or maintain a healthy weight. Do not use any products that contain nicotine or tobacco. These products include cigarettes, chewing tobacco, and vaping devices, such as e-cigarettes. If you need help quitting, ask your health care provider. Know your numbers. Ask your health care provider to check your cholesterol and your blood sugar (glucose). Continue to have your blood tested as directed by your health care provider. Do I need screening for cancer? Depending on your health history and family history, you may need to have cancer screenings at different stages of your life. This may include screening for: Breast cancer. Cervical cancer. Lung cancer. Colorectal cancer. What is my risk for osteoporosis? After menopause, you may be   at increased risk for osteoporosis. Osteoporosis is a condition in which bone destruction happens more quickly than new bone creation. To help prevent osteoporosis or  the bone fractures that can happen because of osteoporosis, you may take the following actions: If you are 19-50 years old, get at least 1,000 mg of calcium and at least 600 international units (IU) of vitamin D per day. If you are older than age 50 but younger than age 70, get at least 1,200 mg of calcium and at least 600 international units (IU) of vitamin D per day. If you are older than age 70, get at least 1,200 mg of calcium and at least 800 international units (IU) of vitamin D per day. Smoking and drinking excessive alcohol increase the risk of osteoporosis. Eat foods that are rich in calcium and vitamin D, and do weight-bearing exercises several times each week as directed by your health care provider. How does menopause affect my mental health? Depression may occur at any age, but it is more common as you become older. Common symptoms of depression include: Feeling depressed. Changes in sleep patterns. Changes in appetite or eating patterns. Feeling an overall lack of motivation or enjoyment of activities that you previously enjoyed. Frequent crying spells. Talk with your health care provider if you think that you are experiencing any of these symptoms. General instructions See your health care provider for regular wellness exams and vaccines. This may include: Scheduling regular health, dental, and eye exams. Getting and maintaining your vaccines. These include: Influenza vaccine. Get this vaccine each year before the flu season begins. Pneumonia vaccine. Shingles vaccine. Tetanus, diphtheria, and pertussis (Tdap) booster vaccine. Your health care provider may also recommend other immunizations. Tell your health care provider if you have ever been abused or do not feel safe at home. Summary Menopause is a normal process in which your ability to get pregnant comes to an end. This condition causes hot flashes, night sweats, decreased interest in sex, mood swings, headaches, or lack  of sleep. Treatment for this condition may include hormone replacement therapy. Take actions to keep yourself healthy, including exercising regularly, eating a healthy diet, watching your weight, and checking your blood pressure and blood sugar levels. Get screened for cancer and depression. Make sure that you are up to date with all your vaccines. This information is not intended to replace advice given to you by your health care provider. Make sure you discuss any questions you have with your health care provider. Document Revised: 05/01/2021 Document Reviewed: 05/01/2021 Elsevier Patient Education  2024 [employer]  

## 2023-09-19 NOTE — Assessment & Plan Note (Signed)
Encouraged diet and exercise for weight loss ?

## 2023-09-19 NOTE — Progress Notes (Signed)
Subjective:    Patient ID: Wanda Aguilar, female    DOB: 14-Feb-1966, 57 y.o.   MRN: [id number]  HPI  Patient presents to clinic today for her annual exam.  She also reports worsening anxiety.  She attributes this to work stress, family stress and her husband's health issues.  She feels like this has been making her lightheaded.  She reports this typically only occurs during the week but it does not occur on the weekend.  She is not taking her fluoxetine as prescribed.  She is not currently seeing a therapist.  She is taking losartan, HCTZ and diltiazem as prescribed by cardiology for her A-fib and hypertension.  Flu: never Tetanus: > 10 years COVID: never Pneumovax: never Shingrix: never Pap smear: Hysterectomy Mammogram: 2024, [facility] Colon screening: 2021, due 5 years, [facility] screening: as needed Dentist: biannually  Diet: She does eat meat. She consumes fruits and veggies. She tries to avoid fried foods. She drinks mostly dt. Soda, water Exercise: None  Review of Systems     Past Medical History:  Diagnosis Date   Atrial fibrillation (HCC)    Fibroids    Frequent headaches    Hypertension    Urine incontinence     Current Outpatient Medications  Medication Sig Dispense Refill   albuterol (VENTOLIN HFA) 108 (90 Base) MCG/ACT inhaler Inhale 1 puff into the lungs every 6 (six) hours as needed for wheezing or shortness of breath. 1 each 0   amoxicillin-clavulanate (AUGMENTIN) 875-125 MG tablet Take 1 tablet by mouth 2 (two) times daily. 20 tablet 0   apixaban (ELIQUIS) 5 MG TABS tablet Take 1 tablet (5 mg total) by mouth 2 (two) times daily. 60 tablet 5   diclofenac (VOLTAREN) 50 MG EC tablet Take 50 mg by mouth as needed.     diltiazem (CARDIZEM) 30 MG tablet Take 1 tablet by mouth up to 3X a day as needed for a heart rate of 110 or greater. 30 tablet 5   diltiazem (TIADYLT ER) 360 MG 24 hr capsule Take 1 capsule (360 mg total) by mouth daily. 90  capsule 3   FLUoxetine (PROZAC) 10 MG capsule TAKE 1 CAPSULE BY MOUTH EVERY DAY (Patient not taking: Reported on 03/08/2023) 90 capsule 0   guaiFENesin-codeine 100-10 MG/5ML syrup Take 5 mLs by mouth 2 (two) times daily as needed for cough. 50 mL 0   hydrochlorothiazide (HYDRODIURIL) 25 MG tablet TAKE 1 TABLET (25 MG TOTAL) BY MOUTH DAILY. 30 tablet 0   Insulin Pen Needle 31G X 5 MM MISC BD Pen Needles- brand specific Inject insulin via insulin pen 6 x daily 30 each 1   losartan (COZAAR) 100 MG tablet TAKE 1 TABLET BY MOUTH EVERY DAY 30 tablet 0   metFORMIN (GLUCOPHAGE) 500 MG tablet TAKE 1 TABLET BY MOUTH 2 TIMES DAILY WITH A MEAL. 180 tablet 0   omeprazole (PRILOSEC) 20 MG capsule Take 1 capsule (20 mg total) by mouth daily. 90 capsule 1   ondansetron (ZOFRAN) 4 MG tablet TAKE 1 TABLET BY MOUTH EVERY 8 HOURS AS NEEDED FOR NAUSEA AND VOMITING 20 tablet 0   OZEMPIC, 0.25 OR 0.5 MG/DOSE, 2 MG/1.5ML SOPN Inject 0.5 mg into the skin once a week. 4.5 mL 0   predniSONE (DELTASONE) 20 MG tablet Take 60mg  PO daily x 2 days, then40mg  PO daily x 2 days, then 20mg  PO daily x 3 days 13 tablet 0   rosuvastatin (CRESTOR) 10 MG tablet TAKE 1  TABLET BY MOUTH EVERY DAY 90 tablet 0   No current facility-administered medications for this visit.    No Known Allergies  Family History  Problem Relation Age of Onset   Congestive Heart Failure Mother    Diabetes type I Father    Diabetes Sister    Diabetes Sister    Arthritis Maternal Grandmother    Alcohol abuse Maternal Grandfather    Diabetes Paternal Grandmother    Diabetes Paternal Grandfather    Diabetes Paternal Aunt    Diabetes Paternal Uncle     Social History   Socioeconomic History   Marital status: Married    Spouse name: Not on file   Number of children: Not on file   Years of education: Not on file   Highest education level: Not on file  Occupational History   Not on file  Tobacco Use   Smoking status: Former    Current packs/day:  0.00    Average packs/day: 0.3 packs/day for 11.9 years (3.6 ttl pk-yrs)    Types: Cigarettes    Start date: 01/14/2009    Quit date: 12/21/2020    Years since quitting: 2.7   Smokeless tobacco: Never  Vaping Use   Vaping status: Never Used  Substance and Sexual Activity   Alcohol use: Yes    Comment: occasional   Drug use: No   Sexual activity: Yes  Other Topics Concern   Not on file  Social History Narrative   Not on file   Social Determinants of Health   Financial Resource Strain: Not on file  Food Insecurity: Not on file  Transportation Needs: Not on file  Physical Activity: Not on file  Stress: Not on file  Social Connections: Not on file  Intimate Partner Violence: Not on file     Constitutional: Patient reports intermittent headaches.  Denies fever, malaise, fatigue, or abrupt weight changes.  HEENT: Denies eye pain, eye redness, ear pain, ringing in the ears, wax buildup, runny nose, nasal congestion, bloody nose, or sore throat. Respiratory: Denies difficulty breathing, shortness of breath, cough or sputum production.   Cardiovascular: Denies chest pain, chest tightness, palpitations or swelling in the hands or feet.  Gastrointestinal: Denies abdominal pain, bloating, constipation, diarrhea or blood in the stool.  GU: Denies urgency, frequency, pain with urination, burning sensation, blood in urine, odor or discharge. Musculoskeletal: Denies decrease in range of motion, difficulty with gait, muscle pain or joint pain and swelling.  Skin: Denies redness, rashes, lesions or ulcercations.  Neurological: Patient reports insomnia, lightheadedness.  Denies difficulty with memory, difficulty with speech or problems with balance and coordination.  Psych: Patient has a history of anxiety.  Denies depression, SI/HI.  No other specific complaints in a complete review of systems (except as listed in HPI above).  Objective:   Physical Exam  BP 126/84 (BP Location: Left Arm,  Patient Position: Sitting, Cuff Size: Normal)   Pulse 66   Temp (!) 96.6 F (35.9 C) (Temporal)   Ht 5\' 3"  (1.6 m)   Wt 193 lb (87.5 kg)   LMP 03/22/2014   SpO2 97%   BMI 34.19 kg/m   Wt Readings from Last 3 Encounters:  03/08/23 185 lb (83.9 kg)  02/20/23 195 lb (88.5 kg)  06/25/22 194 lb (88 kg)    General: Appears her stated age, obese, in NAD. Skin: Warm, dry and intact. No ulcerations noted. HEENT: Head: normal shape and size; Eyes: sclera white, no icterus, conjunctiva pink, PERRLA and EOMs intact;  Neck:  Neck supple, trachea midline. No masses, lumps or thyromegaly present.  Cardiovascular: Normal rate and rhythm. S1,S2 noted.  No murmur, rubs or gallops noted. No JVD or BLE edema. No carotid bruits noted. Pulmonary/Chest: Normal effort and positive vesicular breath sounds. No respiratory distress. No wheezes, rales or ronchi noted.  Abdomen: Soft and nontender. Normal bowel sounds.  Musculoskeletal: Strength 5/5 BUE/BLE. No difficulty with gait.  Neurological: Alert and oriented. Cranial nerves II-XII grossly intact. Coordination normal.  Psychiatric: Mood and affect normal.  Tearful. Judgment and thought content normal.    BMET    Component Value Date/Time   NA 135 02/20/2023 0857   NA 141 02/09/2021 0917   K 3.8 02/20/2023 0857   CL 102 02/20/2023 0857   CO2 25 02/20/2023 0857   GLUCOSE 201 (H) 02/20/2023 0857   BUN 16 02/20/2023 0857   BUN 15 02/09/2021 0917   CREATININE 0.63 02/20/2023 0857   CREATININE 0.79 06/25/2022 1001   CALCIUM 9.5 02/20/2023 0857   GFRNONAA >60 02/20/2023 0857   GFRAA 118 02/09/2021 0917    Lipid Panel     Component Value Date/Time   CHOL 223 (H) 06/25/2022 1001   TRIG 252 (H) 06/25/2022 1001   HDL 49 (L) 06/25/2022 1001   CHOLHDL 4.6 06/25/2022 1001   VLDL 57.6 (H) 06/22/2015 0746   LDLCALC 135 (H) 06/25/2022 1001    CBC    Component Value Date/Time   WBC 6.8 02/20/2023 0857   RBC 4.70 02/20/2023 0857   HGB 14.4  02/20/2023 0857   HGB 14.7 02/09/2021 0917   HCT 41.2 02/20/2023 0857   HCT 42.0 02/09/2021 0917   PLT 255 02/20/2023 0857   PLT 275 02/09/2021 0917   MCV 87.7 02/20/2023 0857   MCV 92 02/09/2021 0917   MCH 30.6 02/20/2023 0857   MCHC 35.0 02/20/2023 0857   RDW 11.5 02/20/2023 0857   RDW 11.9 02/09/2021 0917   LYMPHSABS 2.1 01/22/2021 0339   MONOABS 0.6 01/22/2021 0339   EOSABS 0.1 01/22/2021 0339   BASOSABS 0.1 01/22/2021 0339    Hgb A1C Lab Results  Component Value Date   HGBA1C 7.4 (H) 06/25/2022           Assessment & Plan:   Preventative health maintenance:  Flu shot declined Tetanus declined Encouraged her to get her COVID-vaccine Pneumovax declined and Discussed Shingrix vaccine, she will check coverage with her insurance company and schedule visit she would like to have this done She no longer needs to screen for cervical cancer Mammogram UTD, [facility] Colon screening UTD per her report, will request copy from [facility] Encouraged her to consume a balanced diet and exercise regimen Advised her to see an eye doctor and dentist annually We will check CBC, c-Met, lipid, A1c and urine microalbumin today  RTC in 6 months, follow-up chronic conditions Nicki Reaper, NP

## 2023-09-19 NOTE — Assessment & Plan Note (Addendum)
Deteriorated Has failed sertraline, bupropion and fluoxetine in the past Will trial venlafaxine 75 mg at bedtime Support offered

## 2023-09-19 NOTE — Assessment & Plan Note (Signed)
Will discontinue losartan and hydrochlorothiazide Will start amlodipine-olmesartan-HCTZ 10-40-12.5 mg daily Reinforced DASH diet

## 2023-09-20 LAB — LIPID PANEL
Cholesterol: 227 mg/dL — ABNORMAL HIGH (ref <200)
HDL: 59 mg/dL (ref >=50)
LDL Cholesterol (Calc): 139 mg/dL — ABNORMAL HIGH
Non-HDL Cholesterol (Calc): 168 mg/dL — ABNORMAL HIGH (ref <130)
Total CHOL/HDL Ratio: 3.8 (calc) (ref <5.0)
Triglycerides: 154 mg/dL — ABNORMAL HIGH (ref <150)

## 2023-09-20 LAB — COMPLETE METABOLIC PANEL WITH GFR
AG Ratio: 2.2 (calc) (ref 1.0–2.5)
ALT: 25 U/L (ref 6–29)
AST: 15 U/L (ref 10–35)
Albumin: 4.8 g/dL (ref 3.6–5.1)
Alkaline phosphatase (APISO): 83 U/L (ref 37–153)
BUN: 15 mg/dL (ref 7–25)
CO2: 28 mmol/L (ref 20–32)
Calcium: 9.6 mg/dL (ref 8.6–10.4)
Chloride: 101 mmol/L (ref 98–110)
Creat: 0.74 mg/dL (ref 0.50–1.03)
Globulin: 2.2 g/dL (ref 1.9–3.7)
Glucose, Bld: 173 mg/dL — ABNORMAL HIGH (ref 65–99)
Potassium: 4.3 mmol/L (ref 3.5–5.3)
Sodium: 140 mmol/L (ref 135–146)
Total Bilirubin: 0.5 mg/dL (ref 0.2–1.2)
Total Protein: 7 g/dL (ref 6.1–8.1)
eGFR: 94 mL/min/{1.73_m2} (ref >=60)

## 2023-09-20 LAB — HEMOGLOBIN A1C
Hgb A1c MFr Bld: 6.9 %{Hb} — ABNORMAL HIGH (ref <5.7)
Mean Plasma Glucose: 151 mg/dL
eAG (mmol/L): 8.4 mmol/L

## 2023-09-20 LAB — MICROALBUMIN / CREATININE URINE RATIO
Creatinine, Urine: 81 mg/dL (ref 20–275)
Microalb Creat Ratio: 14 mg/g{creat} (ref <30)
Microalb, Ur: 1.1 mg/dL

## 2023-09-25 ENCOUNTER — Encounter: Payer: Self-pay | Admitting: Internal Medicine

## 2023-10-11 ENCOUNTER — Encounter: Payer: Self-pay | Admitting: Internal Medicine

## 2023-11-05 MED ORDER — FUROSEMIDE 20 MG PO TABS: 20.0000 mg | ORAL_TABLET | Freq: Every day | ORAL | 0 refills | Status: DC

## 2023-11-05 MED ORDER — AMLODIPINE-OLMESARTAN 10-40 MG PO TABS: 1.0000 | ORAL_TABLET | Freq: Every day | ORAL | 0 refills | Status: DC

## 2023-11-05 NOTE — Addendum Note (Signed)
Addended by: Lorre Munroe on: 11/05/2023 07:47 AM   Modules accepted: Orders

## 2023-11-19 ENCOUNTER — Ambulatory Visit
Admission: EM | Admit: 2023-11-19 | Discharge: 2023-11-19 | Disposition: A | Discharge status: Home / Self Care | Payer: No Typology Code available for payment source | Attending: Emergency Medicine | Admitting: Emergency Medicine

## 2023-11-19 DIAGNOSIS — J111 Influenza due to unidentified influenza virus with other respiratory manifestations: Secondary | ICD-10-CM

## 2023-11-19 DIAGNOSIS — R52 Pain, unspecified: Secondary | ICD-10-CM | POA: Diagnosis present

## 2023-11-19 LAB — RESP PANEL BY RT-PCR (FLU A&B, COVID) ARPGX2
Influenza A by PCR: NEGATIVE
Influenza B by PCR: NEGATIVE
SARS Coronavirus 2 by RT PCR: NEGATIVE

## 2023-11-19 MED ORDER — ACETAMINOPHEN 325 MG PO TABS
650.0000 mg | ORAL_TABLET | Freq: Once | ORAL | Status: AC
  Administered 2023-11-19: 650 mg via ORAL

## 2023-11-19 NOTE — Discharge Instructions (Addendum)
Do not take aspirin,motrin, ibuprofen or aleve/naproxen while on eloquis. May use lidocaine or salon pas patches for back pain as label directed. May take maximum dose of tylenol 1000 mg every 6 hours, not to exceed 4000 mg in 24 hours. Push fluids (jello,popsicles, gatorade, chicken noodle soup,water,etc). Your covid and flu tests are negative. Most likely this is a viral illness. Go to ER for new or worsening issues or concerns.

## 2023-11-19 NOTE — ED Provider Notes (Signed)
[facility]    CSN: [id number] Arrival date & time: 11/19/23  1634      History   Chief Complaint Chief Complaint  Patient presents with   Fever   Generalized Body Aches   Headache   Cough    HPI Wanda Aguilar is a 58 y.o. female.   57 year old female, Wanda Aguilar, presents to urgent care for evaluation of severe body aches " 9/10",fever,headache, dizziness x 2.5 days. Last took tylenol at 10am and aspirin for symptom management. Pt states she has been in bed for last 2 days due to feeling so bad, has been drinking water, decreased solid intake.   PMH: A fib on eloquis, headaches, HTN  The history is provided by the patient. No language interpreter was used.    Past Medical History:  Diagnosis Date   Atrial fibrillation (HCC)    Fibroids    Frequent headaches    Hypertension    Urine incontinence     Patient Active Problem List   Diagnosis Date Noted   Influenza-like illness 11/19/2023   Generalized body aches 11/19/2023   GERD (gastroesophageal reflux disease) 09/06/2021   Class 1 obesity due to excess calories with serious comorbidity and body mass index (BMI) of 34.0 to 34.9 in adult 09/06/2021   HLD (hyperlipidemia) 05/24/2021   Type 2 diabetes mellitus without complication, without long-term current use of insulin (HCC) 05/17/2021   Paroxysmal atrial fibrillation (HCC) 02/09/2021   Frequent headaches 09/13/2018   Insomnia 10/11/2016   Anxiety state 03/04/2014   Essential hypertension 02/12/2014    Past Surgical History:  Procedure Laterality Date   ABDOMINAL HYSTERECTOMY Bilateral 04/13/2014   Procedure: HYSTERECTOMY ABDOMINAL WITH BILATERAL SALPINGO OOPHORECTOMY;  Surgeon: Juluis Mire, MD;  Location: [facility];  Service: Gynecology;  Laterality: Bilateral;   CARPAL TUNNEL RELEASE     Right Hand   Ceaserean     CESAREAN SECTION     GANGLION CYST EXCISION     Bilateral   TOOTH EXTRACTION      OB History   No obstetric history on  file.      Home Medications    Prior to Admission medications   Medication Sig Start Date End Date Taking? Authorizing Provider  amLODipine-olmesartan (AZOR) 10-40 MG tablet Take 1 tablet by mouth daily. 11/05/23  Yes Baity, Salvadore Oxford, NP  apixaban (ELIQUIS) 5 MG TABS tablet Take 1 tablet (5 mg total) by mouth 2 (two) times daily. 02/20/23  Yes Lanier Prude, MD  diltiazem (CARDIZEM) 30 MG tablet Take 1 tablet by mouth up to 3X a day as needed for a heart rate of 110 or greater. 02/20/23  Yes Lanier Prude, MD  diltiazem (TIADYLT ER) 360 MG 24 hr capsule Take 1 capsule (360 mg total) by mouth daily. 02/20/23  Yes Lanier Prude, MD  metFORMIN (GLUCOPHAGE) 500 MG tablet TAKE 1 TABLET BY MOUTH 2 TIMES DAILY WITH A MEAL. 01/28/23  Yes Baity, Salvadore Oxford, NP  omeprazole (PRILOSEC) 20 MG capsule Take 1 capsule (20 mg total) by mouth daily. 06/25/22  Yes Baity, Salvadore Oxford, NP  OZEMPIC, 0.25 OR 0.5 MG/DOSE, 2 MG/1.5ML SOPN Inject 0.25 mg into the skin once a week. 09/19/23  Yes Baity, Salvadore Oxford, NP  rosuvastatin (CRESTOR) 10 MG tablet TAKE 1 TABLET BY MOUTH EVERY DAY 01/28/23  Yes Lorre Munroe, NP  venlafaxine XR (EFFEXOR XR) 75 MG 24 hr capsule Take 1 capsule (75 mg total) by mouth daily  with breakfast. 09/19/23  Yes Baity, Salvadore Oxford, NP  albuterol (VENTOLIN HFA) 108 (90 Base) MCG/ACT inhaler Inhale 1 puff into the lungs every 6 (six) hours as needed for wheezing or shortness of breath. 08/02/21   Lanier Prude, MD  diclofenac (VOLTAREN) 50 MG EC tablet Take 50 mg by mouth as needed. 03/28/21   [provider]  furosemide (LASIX) 20 MG tablet Take 1 tablet (20 mg total) by mouth daily. 11/05/23   Lorre Munroe, NP  gabapentin (NEURONTIN) 100 MG capsule Take 100-200 mg by mouth at bedtime.    [provider]  Insulin Pen Needle 31G X 5 MM MISC BD Pen Needles- brand specific Inject insulin via insulin pen 6 x daily 09/08/21   Lorre Munroe, NP  ondansetron (ZOFRAN) 4 MG tablet  TAKE 1 TABLET BY MOUTH EVERY 8 HOURS AS NEEDED FOR NAUSEA AND VOMITING 06/04/23   Lorre Munroe, NP    Family History Family History  Problem Relation Age of Onset   Congestive Heart Failure Mother    Diabetes type I Father    Diabetes Sister    Diabetes Sister    Arthritis Maternal Grandmother    Alcohol abuse Maternal Grandfather    Diabetes Paternal Grandmother    Diabetes Paternal Grandfather    Diabetes Paternal Aunt    Diabetes Paternal Uncle     Social History Social History   Tobacco Use   Smoking status: Former    Current packs/day: 0.00    Average packs/day: 0.3 packs/day for 11.9 years (3.6 ttl pk-yrs)    Types: Cigarettes    Start date: 01/14/2009    Quit date: 12/21/2020    Years since quitting: 2.9   Smokeless tobacco: Never  Vaping Use   Vaping status: Never Used  Substance Use Topics   Alcohol use: Yes    Comment: occasional   Drug use: No     Allergies   Patient has no known allergies.   Review of Systems Review of Systems  Constitutional:  Positive for fever.  Musculoskeletal:  Positive for back pain, myalgias and neck pain.  Neurological:  Positive for dizziness and headaches.  All other systems reviewed and are negative.    Physical Exam Triage Vital Signs ED Triage Vitals  Encounter Vitals Group     BP 11/19/23 1646 (!) 154/95     Systolic BP Percentile --      Diastolic BP Percentile --      Pulse Rate 11/19/23 1646 98     Resp 11/19/23 1646 (!) 28     Temp 11/19/23 1646 (!) 100.6 F (38.1 C)     Temp src --      SpO2 11/19/23 1646 93 %     Weight --      Height --      Head Circumference --      Peak Flow --      Pain Score 11/19/23 1644 9     Pain Loc --      Pain Education --      Exclude from Growth Chart --    No data found.  Updated Vital Signs BP 123/80 (BP Location: Right Arm)   Pulse 96   Temp 99.3 F (37.4 C) (Oral)   Resp (!) 24   LMP 03/22/2014   SpO2 93%   Visual Acuity Right Eye Distance:   Left  Eye Distance:   Bilateral Distance:    Right Eye Near:   Left  Eye Near:    Bilateral Near:     Physical Exam Vitals and nursing note reviewed.  Constitutional:      General: She is not in acute distress.    Appearance: She is well-developed.  HENT:     Head: Normocephalic.     Right Ear: Tympanic membrane is retracted.     Left Ear: Tympanic membrane is retracted.     Nose: Mucosal edema and congestion present.     Mouth/Throat:     Lips: Pink.     Mouth: Mucous membranes are moist.     Pharynx: Oropharynx is clear.  Eyes:     General: Lids are normal.     Conjunctiva/sclera: Conjunctivae normal.     Pupils: Pupils are equal, round, and reactive to light.  Neck:     Trachea: No tracheal deviation.  Cardiovascular:     Rate and Rhythm: Normal rate and regular rhythm.     Pulses: Normal pulses.     Heart sounds: Normal heart sounds. No murmur heard. Pulmonary:     Effort: Pulmonary effort is normal.     Breath sounds: Normal breath sounds and air entry.  Abdominal:     General: Bowel sounds are normal.     Palpations: Abdomen is soft.     Tenderness: There is no abdominal tenderness.  Musculoskeletal:        General: Normal range of motion.     Cervical back: Normal range of motion.  Lymphadenopathy:     Cervical: No cervical adenopathy.  Skin:    General: Skin is warm and dry.     Findings: No rash.  Neurological:     General: No focal deficit present.     Mental Status: She is alert and oriented to person, place, and time.     GCS: GCS eye subscore is 4. GCS verbal subscore is 5. GCS motor subscore is 6.  Psychiatric:        Attention and Perception: Attention normal.        Mood and Affect: Mood normal.        Speech: Speech normal.        Behavior: Behavior normal. Behavior is cooperative.      UC Treatments / Results  Labs (all labs ordered are listed, but only abnormal results are displayed) Labs Reviewed  RESP PANEL BY RT-PCR (FLU A&B, COVID) [id number]     EKG   Radiology No results found.  Procedures Procedures (including critical care time)  Medications Ordered in UC Medications  acetaminophen (TYLENOL) tablet 650 mg (650 mg Oral Given 11/19/23 1652)    Initial Impression / Assessment and Plan / UC Course  I have reviewed the triage vital signs and the nursing notes.  Pertinent labs & imaging results that were available during my care of the patient were reviewed by me and considered in my medical decision making (see chart for details).    Discussed exam findings and plan of care with patient, strict go to ER precautions given.   Patient verbalized understanding to this provider.  Ddx: Flu like illness, body aches, viral illness Final Clinical Impressions(s) / UC Diagnoses   Final diagnoses:  Influenza-like illness  Generalized body aches     Discharge Instructions      Do not take aspirin,motrin, ibuprofen or aleve/naproxen while on eloquis. May use lidocaine or salon pas patches for back pain as label directed. May take maximum dose of tylenol 1000 mg every 6 hours, not to exceed  4000 mg in 24 hours. Push fluids (jello,popsicles, gatorade, chicken noodle soup,water,etc). Your covid and flu tests are negative. Most likely this is a viral illness. Go to ER for new or worsening issues or concerns.      ED Prescriptions   None    PDMP not reviewed this encounter.   Clancy Gourd, NP 11/19/23 1747

## 2023-11-19 NOTE — ED Triage Notes (Addendum)
Sx x 2.5 days  Severe body aches-neck pain-lower back pain. Fever that comes and goes through the night-headache. Dizziness when she stands up. Last dose of tylenol 10 am this morning.

## 2023-11-25 ENCOUNTER — Telehealth: Payer: No Typology Code available for payment source | Admitting: Physician Assistant

## 2023-11-25 ENCOUNTER — Ambulatory Visit: Payer: Self-pay

## 2023-11-25 DIAGNOSIS — J4521 Mild intermittent asthma with (acute) exacerbation: Secondary | ICD-10-CM | POA: Diagnosis not present

## 2023-11-25 DIAGNOSIS — J019 Acute sinusitis, unspecified: Secondary | ICD-10-CM

## 2023-11-25 DIAGNOSIS — B9689 Other specified bacterial agents as the cause of diseases classified elsewhere: Secondary | ICD-10-CM | POA: Diagnosis not present

## 2023-11-25 MED ORDER — DOXYCYCLINE HYCLATE 100 MG PO TABS: 100.0000 mg | ORAL_TABLET | Freq: Two times a day (BID) | ORAL | 0 refills | Status: DC

## 2023-11-25 MED ORDER — PREDNISONE 20 MG PO TABS
40.0000 mg | ORAL_TABLET | Freq: Every day | ORAL | 0 refills | Status: DC
Start: 2023-11-25 — End: 2024-02-11

## 2023-11-25 MED ORDER — ALBUTEROL SULFATE HFA 108 (90 BASE) MCG/ACT IN AERS: 2.0000 | INHALATION_SPRAY | Freq: Four times a day (QID) | RESPIRATORY_TRACT | 0 refills | Status: DC | PRN

## 2023-11-25 MED ORDER — BENZONATATE 100 MG PO CAPS: 100.0000 mg | ORAL_CAPSULE | Freq: Three times a day (TID) | ORAL | 0 refills | Status: DC | PRN

## 2023-11-25 NOTE — Progress Notes (Signed)
Virtual Visit Consent   Wanda Aguilar, you are scheduled for a virtual visit with a [facility] provider today. Just as with appointments in the office, your consent must be obtained to participate. Your consent will be active for this visit and any virtual visit you may have with one of our providers in the next 365 days. If you have a MyChart account, a copy of this consent can be sent to you electronically.  As this is a virtual visit, video technology does not allow for your provider to perform a traditional examination. This may limit your provider's ability to fully assess your condition. If your provider identifies any concerns that need to be evaluated in person or the need to arrange testing (such as labs, EKG, etc.), we will make arrangements to do so. Although advances in technology are sophisticated, we cannot ensure that it will always work on either your end or our end. If the connection with a video visit is poor, the visit may have to be switched to a telephone visit. With either a video or telephone visit, we are not always able to ensure that we have a secure connection.  By engaging in this virtual visit, you consent to the provision of healthcare and authorize for your insurance to be billed (if applicable) for the services provided during this visit. Depending on your insurance coverage, you may receive a charge related to this service.  I need to obtain your verbal consent now. Are you willing to proceed with your visit today? Wanda Aguilar has provided verbal consent on 11/25/2023 for a virtual visit (video or telephone). Wanda Aguilar, [place]  Date: 11/25/2023 12:11 PM  Virtual Visit via Video Note   I, Wanda Aguilar, connected with  Wanda Aguilar  ([id number], 02-11-1966) on 11/25/23 at 12:00 PM EST by a video-enabled telemedicine application and verified that I am speaking with the correct person using two identifiers.  Location: Patient: Virtual Visit Location  Patient: Home Provider: Virtual Visit Location Provider: Home Office   I discussed the limitations of evaluation and management by telemedicine and the availability of in person appointments. The patient expressed understanding and agreed to proceed.    History of Present Illness: Wanda Aguilar is a 57 y.o. who identifies as a female who was assigned female at birth, and is being seen today for 2 weeks of ongoing and progressive URI symptoms. Endorses initially starting with some head and chest congestion and milder cough. Was associated with fever. As such she was evaluated at [facility] on 11/26 at which time a respiratory panel was obtained and negative for influenza, RSV, COVID. Was given supportive measures to follow for symptom management. Notes that the fever broke this past week but the cough has been persistent and progressive, along with fatigue and windedness with exertion. Has had to use her albuterol inhaler which is not common for her, and she even used one of her grandson's breathing treatments. Notes substantial sinus pain and tooth pain with this. Occasionally will feel off balance with moving around quickly but denies true vertigo, falls or syncope. Transportation is limited for her right now while husband is at work.  Delsym OTC -- only somewhat helpful.   HPI: HPI  Problems:  Patient Active Problem List   Diagnosis Date Noted   Influenza-like illness 11/19/2023   Generalized body aches 11/19/2023   GERD (gastroesophageal reflux disease) 09/06/2021   Class 1 obesity due to excess calories with serious  comorbidity and body mass index (BMI) of 34.0 to 34.9 in adult 09/06/2021   HLD (hyperlipidemia) 05/24/2021   Type 2 diabetes mellitus without complication, without long-term current use of insulin (HCC) 05/17/2021   Paroxysmal atrial fibrillation (HCC) 02/09/2021   Frequent headaches 09/13/2018   Insomnia 10/11/2016   Anxiety state 03/04/2014   Essential hypertension  02/12/2014    Allergies: No Known Allergies Medications:  Current Outpatient Medications:    albuterol (VENTOLIN HFA) 108 (90 Base) MCG/ACT inhaler, Inhale 2 puffs into the lungs every 6 (six) hours as needed for wheezing or shortness of breath., Disp: 8 g, Rfl: 0   doxycycline (VIBRA-TABS) 100 MG tablet, Take 1 tablet (100 mg total) by mouth 2 (two) times daily., Disp: 20 tablet, Rfl: 0   predniSONE (DELTASONE) 20 MG tablet, Take 2 tablets (40 mg total) by mouth daily with breakfast., Disp: 10 tablet, Rfl: 0   amLODipine-olmesartan (AZOR) 10-40 MG tablet, Take 1 tablet by mouth daily., Disp: 90 tablet, Rfl: 0   apixaban (ELIQUIS) 5 MG TABS tablet, Take 1 tablet (5 mg total) by mouth 2 (two) times daily., Disp: 60 tablet, Rfl: 5   diclofenac (VOLTAREN) 50 MG EC tablet, Take 50 mg by mouth as needed., Disp: , Rfl:    diltiazem (CARDIZEM) 30 MG tablet, Take 1 tablet by mouth up to 3X a day as needed for a heart rate of 110 or greater., Disp: 30 tablet, Rfl: 5   diltiazem (TIADYLT ER) 360 MG 24 hr capsule, Take 1 capsule (360 mg total) by mouth daily., Disp: 90 capsule, Rfl: 3   furosemide (LASIX) 20 MG tablet, Take 1 tablet (20 mg total) by mouth daily., Disp: 30 tablet, Rfl: 0   gabapentin (NEURONTIN) 100 MG capsule, Take 100-200 mg by mouth at bedtime., Disp: , Rfl:    Insulin Aguilar Needle 31G X 5 MM MISC, BD Aguilar Needles- brand specific Inject insulin via insulin Aguilar 6 x daily, Disp: 30 each, Rfl: 1   metFORMIN (GLUCOPHAGE) 500 MG tablet, TAKE 1 TABLET BY MOUTH 2 TIMES DAILY WITH A MEAL., Disp: 180 tablet, Rfl: 0   omeprazole (PRILOSEC) 20 MG capsule, Take 1 capsule (20 mg total) by mouth daily., Disp: 90 capsule, Rfl: 1   ondansetron (ZOFRAN) 4 MG tablet, TAKE 1 TABLET BY MOUTH EVERY 8 HOURS AS NEEDED FOR NAUSEA AND VOMITING, Disp: 20 tablet, Rfl: 0   OZEMPIC, 0.25 OR 0.5 MG/DOSE, 2 MG/1.5ML SOPN, Inject 0.25 mg into the skin once a week., Disp: 1.5 mL, Rfl: 0   rosuvastatin (CRESTOR) 10 MG  tablet, TAKE 1 TABLET BY MOUTH EVERY DAY, Disp: 90 tablet, Rfl: 0   venlafaxine XR (EFFEXOR XR) 75 MG 24 hr capsule, Take 1 capsule (75 mg total) by mouth daily with breakfast., Disp: 90 capsule, Rfl: 1  Observations/Objective: Patient is well-developed, well-nourished in no acute distress.  Resting comfortably  at home.  Head is normocephalic, atraumatic.  No labored breathing.  Speech is clear and coherent with logical content.  Patient is alert and oriented at baseline.   Assessment and Plan: 1. Acute bacterial sinusitis - predniSONE (DELTASONE) 20 MG tablet; Take 2 tablets (40 mg total) by mouth daily with breakfast.  Dispense: 10 tablet; Refill: 0 - doxycycline (VIBRA-TABS) 100 MG tablet; Take 1 tablet (100 mg total) by mouth 2 (two) times daily.  Dispense: 20 tablet; Refill: 0  2. Mild intermittent asthma with exacerbation - albuterol (VENTOLIN HFA) 108 (90 Base) MCG/ACT inhaler; Inhale 2 puffs into the lungs  every 6 (six) hours as needed for wheezing or shortness of breath.  Dispense: 8 g; Refill: 0 - predniSONE (DELTASONE) 20 MG tablet; Take 2 tablets (40 mg total) by mouth daily with breakfast.  Dispense: 10 tablet; Refill: 0 - doxycycline (VIBRA-TABS) 100 MG tablet; Take 1 tablet (100 mg total) by mouth 2 (two) times daily.  Dispense: 20 tablet; Refill: 0  Rx Doxycycline.  Increase fluids.  Rest.  Saline nasal spray.  Probiotic.  Mucinex as directed.  Humidifier in bedroom. Albuterol refilled. Will start prednisone burst (5-day) as last A1C at acceptable range. Tessalon per orders. ER for any worsening or non-improving symptoms.    Follow Up Instructions: I discussed the assessment and treatment plan with the patient. The patient was provided an opportunity to ask questions and all were answered. The patient agreed with the plan and demonstrated an understanding of the instructions.  A copy of instructions were sent to the patient via MyChart unless otherwise noted below.   The  patient was advised to call back or seek an in-person evaluation if the symptoms worsen or if the condition fails to improve as anticipated.    Wanda Climes, PA-C

## 2023-11-25 NOTE — Patient Instructions (Addendum)
Shanah S Brazzel, thank you for joining Piedad Climes, PA-C for today's virtual visit.  While this provider is not your primary care provider (PCP), if your PCP is located in our provider database this encounter information will be shared with them immediately following your visit.   A [facility] MyChart account gives you access to today's visit and all your visits, tests, and labs performed at [facility] " click here if you don't have a [facility] MyChart account or go to [email or web address]  Consent: (Patient) Starnisha Cacciola Beretta provided verbal consent for this virtual visit at the beginning of the encounter.  Current Medications:  Current Outpatient Medications:    albuterol (VENTOLIN HFA) 108 (90 Base) MCG/ACT inhaler, Inhale 1 puff into the lungs every 6 (six) hours as needed for wheezing or shortness of breath., Disp: 1 each, Rfl: 0   amLODipine-olmesartan (AZOR) 10-40 MG tablet, Take 1 tablet by mouth daily., Disp: 90 tablet, Rfl: 0   apixaban (ELIQUIS) 5 MG TABS tablet, Take 1 tablet (5 mg total) by mouth 2 (two) times daily., Disp: 60 tablet, Rfl: 5   diclofenac (VOLTAREN) 50 MG EC tablet, Take 50 mg by mouth as needed., Disp: , Rfl:    diltiazem (CARDIZEM) 30 MG tablet, Take 1 tablet by mouth up to 3X a day as needed for a heart rate of 110 or greater., Disp: 30 tablet, Rfl: 5   diltiazem (TIADYLT ER) 360 MG 24 hr capsule, Take 1 capsule (360 mg total) by mouth daily., Disp: 90 capsule, Rfl: 3   furosemide (LASIX) 20 MG tablet, Take 1 tablet (20 mg total) by mouth daily., Disp: 30 tablet, Rfl: 0   gabapentin (NEURONTIN) 100 MG capsule, Take 100-200 mg by mouth at bedtime., Disp: , Rfl:    Insulin Pen Needle 31G X 5 MM MISC, BD Pen Needles- brand specific Inject insulin via insulin pen 6 x daily, Disp: 30 each, Rfl: 1   metFORMIN (GLUCOPHAGE) 500 MG tablet, TAKE 1 TABLET BY MOUTH 2 TIMES DAILY WITH A MEAL., Disp: 180 tablet, Rfl: 0   omeprazole (PRILOSEC) 20 MG  capsule, Take 1 capsule (20 mg total) by mouth daily., Disp: 90 capsule, Rfl: 1   ondansetron (ZOFRAN) 4 MG tablet, TAKE 1 TABLET BY MOUTH EVERY 8 HOURS AS NEEDED FOR NAUSEA AND VOMITING, Disp: 20 tablet, Rfl: 0   OZEMPIC, 0.25 OR 0.5 MG/DOSE, 2 MG/1.5ML SOPN, Inject 0.25 mg into the skin once a week., Disp: 1.5 mL, Rfl: 0   rosuvastatin (CRESTOR) 10 MG tablet, TAKE 1 TABLET BY MOUTH EVERY DAY, Disp: 90 tablet, Rfl: 0   venlafaxine XR (EFFEXOR XR) 75 MG 24 hr capsule, Take 1 capsule (75 mg total) by mouth daily with breakfast., Disp: 90 capsule, Rfl: 1   Medications ordered in this encounter:  No orders of the defined types were placed in this encounter.    *If you need refills on other medications prior to your next appointment, please contact your pharmacy*  Follow-Up: Call back or seek an in-person evaluation if the symptoms worsen or if the condition fails to improve as anticipated.  [facility] [phone]  Other Instructions Sinus Infection, Adult A sinus infection is soreness and swelling (inflammation) of your sinuses. Sinuses are hollow spaces in the bones around your face. They are located: Around your eyes. In the middle of your forehead. Behind your nose. In your cheekbones. Your sinuses and nasal passages are lined with a fluid called mucus. Mucus drains out  of your sinuses. Swelling can trap mucus in your sinuses. This lets germs (bacteria, virus, or fungus) grow, which leads to infection. Most of the time, this condition is caused by a virus. What are the causes? Allergies. Asthma. Germs. Things that block your nose or sinuses. Growths in the nose (nasal polyps). Chemicals or irritants in the air. A fungus. This is rare. What increases the risk? Having a weak body defense system (immune system). Doing a lot of swimming or diving. Using nasal sprays too much. Smoking. What are the signs or symptoms? The main symptoms of this condition are pain and  a feeling of pressure around the sinuses. Other symptoms include: Stuffy nose (congestion). This may make it hard to breathe through your nose. Runny nose (drainage). Soreness, swelling, and warmth in the sinuses. A cough that may get worse at night. Being unable to smell and taste. Mucus that collects in the throat or the back of the nose (postnasal drip). This may cause a sore throat or bad breath. Being very tired (fatigued). A fever. How is this diagnosed? Your symptoms. Your medical history. A physical exam. Tests to find out if your condition is short-term (acute) or long-term (chronic). Your doctor may: Check your nose for growths (polyps). Check your sinuses using a tool that has a light on one end (endoscope). Check for allergies or germs. Do imaging tests, such as an MRI or CT scan. How is this treated? Treatment for this condition depends on the cause and whether it is short-term or long-term. If caused by a virus, your symptoms should go away on their own within 10 days. You may be given medicines to relieve symptoms. They include: Medicines that shrink swollen tissue in the nose. A spray that treats swelling of the nostrils. Rinses that help get rid of thick mucus in your nose (nasal saline washes). Medicines that treat allergies (antihistamines). Over-the-counter pain relievers. If caused by bacteria, your doctor may wait to see if you will get better without treatment. You may be given antibiotic medicine if you have: A very bad infection. A weak body defense system. If caused by growths in the nose, surgery may be needed. Follow these instructions at home: Medicines Take, use, or apply over-the-counter and prescription medicines only as told by your doctor. These may include nasal sprays. If you were prescribed an antibiotic medicine, take it as told by your doctor. Do not stop taking it even if you start to feel better. Hydrate and humidify  Drink enough water to  keep your pee (urine) pale yellow. Use a cool mist humidifier to keep the humidity level in your home above 50%. Breathe in steam for 10-15 minutes, 3-4 times a day, or as told by your doctor. You can do this in the bathroom while a hot shower is running. Try not to spend time in cool or dry air. Rest Rest as much as you can. Sleep with your head raised (elevated). Make sure you get enough sleep each night. General instructions  Put a warm, moist washcloth on your face 3-4 times a day, or as often as told by your doctor. Use nasal saline washes as often as told by your doctor. Wash your hands often with soap and water. If you cannot use soap and water, use hand sanitizer. Do not smoke. Avoid being around people who are smoking (secondhand smoke). Keep all follow-up visits. Contact a doctor if: You have a fever. Your symptoms get worse. Your symptoms do not get better  within 10 days. Get help right away if: You have a very bad headache. You cannot stop vomiting. You have very bad pain or swelling around your face or eyes. You have trouble seeing. You feel confused. Your neck is stiff. You have trouble breathing. These symptoms may be an emergency. Get help right away. Call 911. Do not wait to see if the symptoms will go away. Do not drive yourself to the hospital. Summary A sinus infection is swelling of your sinuses. Sinuses are hollow spaces in the bones around your face. This condition is caused by tissues in your nose that become inflamed or swollen. This traps germs. These can lead to infection. If you were prescribed an antibiotic medicine, take it as told by your doctor. Do not stop taking it even if you start to feel better. Keep all follow-up visits. This information is not intended to replace advice given to you by your health care provider. Make sure you discuss any questions you have with your health care provider. Document Revised: 11/14/2021 Document Reviewed:  11/14/2021 Elsevier Patient Education  2024 Elsevier Inc.    If you have been instructed to have an in-person evaluation today at a local Urgent Care facility, please use the link below. It will take you to a list of all of our available [facility], including address, phone number and hours of operation. Please do not delay care.  [facility]  If you or a family member do not have a primary care provider, use the link below to schedule a visit and establish care. When you choose a [facility] primary care physician or advanced practice provider, you gain a long-term partner in health. Find a Primary Care Provider  Learn more about [facility]'s in-office and virtual care options: [facility] - Get Care Now

## 2023-11-25 NOTE — Telephone Encounter (Addendum)
Chief Complaint: Cough Symptoms: hoarse voice, a little wheezing, a little hard to catch breath 5 min after coughing spell, coughing severity 10/10, cough up brown/yellow sputum, dizziness when up and moving around Frequency: onset 10 days, seen in UC on last Tuesday Pertinent Negatives: Patient denies other symptoms Disposition: [] ED /[] Urgent Care (no appt availability in office) / [] Appointment(In office/virtual)/ [x]  [facility]/ [] Home Care/ [] Refused Recommended Disposition /[] [facility]/ []  Follow-up with PCP Additional Notes: Patient was seen at the UC last Tuesday for symptoms fever, body aches-all tests negative, viral diagnosis given. She says the cough started on Thursday, taking OTC delsym is not helping. She says she's probably taking more than prescribed (dizziness when up to walk). She says that she used her grandson's nebulizer albuterol twice and did feel better after using. She doesn't use inhalers or nebulizer. Advised no availability in the office with any provider, offered float provider at [facility]. Patient says she wouldn't be able to drive that far. Advised virutal UC visit today, she agreed.     Summary: Fever  Generalized Body Aches  Headache  Cough Chief complaint Advice   Pt seen in the UC on 11/19/23 for Fever  Generalized Body Aches  Headache  Cough Chief complaint calling to schedule appt with Rene Kocher to follow up. Pt barely un able to speak due to hoarse voice. No avialable appts until Thursday. Please advise with the patient      Reason for Disposition  SEVERE coughing spells (e.g., whooping sound after coughing, vomiting after coughing)  Answer Assessment - Initial Assessment Questions 1. ONSET: "When did the cough begin?"      This past Thursday it really kicked in after UC visit on last Tuesday 2. SEVERITY: "How bad is the cough today?"      10 3. SPUTUM: "Describe the color of your sputum" (none, dry cough; clear, white, yellow,  green)     Yellow, brown 4. HEMOPTYSIS: "Are you coughing up any blood?" If so ask: "How much?" (flecks, streaks, tablespoons, etc.)     No 5. DIFFICULTY BREATHING: "Are you having difficulty breathing?" If Yes, ask: "How bad is it?" (e.g., mild, moderate, severe)    - MILD: No SOB at rest, mild SOB with walking, speaks normally in sentences, can lie down, no retractions, pulse < 100.    - MODERATE: SOB at rest, SOB with minimal exertion and prefers to sit, cannot lie down flat, speaks in phrases, mild retractions, audible wheezing, pulse 100-120.    - SEVERE: Very SOB at rest, speaks in single words, struggling to breathe, sitting hunched forward, retractions, pulse > 120      A little bit from coughing for 5 mins after it's hard to catch breath 6. FEVER: "Do you have a fever?" If Yes, ask: "What is your temperature, how was it measured, and when did it start?"     No 7. CARDIAC HISTORY: "Do you have any history of heart disease?" (e.g., heart attack, congestive heart failure)      No 8. LUNG HISTORY: "Do you have any history of lung disease?"  (e.g., pulmonary embolus, asthma, emphysema)     No 9. OTHER SYMPTOMS: "Do you have any other symptoms?" (e.g., runny nose, wheezing, chest pain)       Hoarse voice, a little wheezing  Protocols used: Cough - Acute Productive-A-AH

## 2023-11-28 ENCOUNTER — Ambulatory Visit
Admission: RE | Admit: 2023-11-28 | Discharge: 2023-11-28 | Disposition: A | Discharge status: Home / Self Care | Payer: No Typology Code available for payment source | Source: Ambulatory Visit | Attending: Internal Medicine | Admitting: Internal Medicine

## 2023-11-28 ENCOUNTER — Ambulatory Visit: Payer: No Typology Code available for payment source | Admitting: Internal Medicine

## 2023-11-28 ENCOUNTER — Encounter: Payer: Self-pay | Admitting: Internal Medicine

## 2023-11-28 ENCOUNTER — Ambulatory Visit
Admission: RE | Admit: 2023-11-28 | Discharge: 2023-11-28 | Disposition: A | Discharge status: Home / Self Care | Payer: No Typology Code available for payment source | Attending: Internal Medicine | Admitting: Internal Medicine

## 2023-11-28 VITALS — BP 138/78 | HR 72 | Ht 63.0 in | Wt 187.2 lb

## 2023-11-28 DIAGNOSIS — R051 Acute cough: Secondary | ICD-10-CM

## 2023-11-28 DIAGNOSIS — J019 Acute sinusitis, unspecified: Secondary | ICD-10-CM | POA: Diagnosis present

## 2023-11-28 DIAGNOSIS — B9689 Other specified bacterial agents as the cause of diseases classified elsewhere: Secondary | ICD-10-CM

## 2023-11-28 DIAGNOSIS — R0602 Shortness of breath: Secondary | ICD-10-CM

## 2023-11-28 MED ORDER — AMOXICILLIN-POT CLAVULANATE 875-125 MG PO TABS
1.0000 | ORAL_TABLET | Freq: Two times a day (BID) | ORAL | 0 refills | Status: DC
Start: 2023-11-28 — End: 2024-02-11

## 2023-11-28 MED ORDER — METHYLPREDNISOLONE ACETATE 80 MG/ML IJ SUSP
80.0000 mg | Freq: Once | INTRAMUSCULAR | Status: AC
  Administered 2023-11-28: 80 mg via INTRAMUSCULAR

## 2023-11-28 NOTE — Progress Notes (Signed)
Subjective:    Patient ID: Wanda Aguilar, female    DOB: 13-Sep-1966, 57 y.o.   MRN: [id number]  HPI  Discussed the use of AI scribe software for clinical note transcription with the patient, who gave verbal consent to proceed.   The patient, with a recent diagnosis of bacterial sinusitis, presents with persistent symptoms despite initiation of treatment. They initially sought care at an urgent care center on November 26th, where they were tested for flu and COVID-19, both of which were negative. They were advised to take Tylenol, increase fluid intake, and rest.  On December 2nd, they had an e-visit where they were prescribed albuterol, prednisone, doxycycline, and tessalon perles. However, they only received three medications from the pharmacy, and it appears that the antibiotic doxycycline was not picked up. The pharmacy was called, and they reported doxycycline was picked up but patient is adamant that it was not in her bag and she is not taking this.  The patient reports that their headache has improved, but they still experience some pressure. They deny having a runny nose or nasal congestion. They report a cough, which they describe as non-productive, and they have been using an albuterol inhaler and nebulizer treatments. They also report shortness of breath when coughing. They deny any recent fever, chills, body aches, nausea, vomiting, or diarrhea.  The patient has been unable to work due to their symptoms and has been written out of work until the day after Thanksgiving, and then again until yesterday. They express a need for a note to extend their time off work until the following Monday.       Review of Systems   Past Medical History:  Diagnosis Date   Atrial fibrillation (HCC)    Fibroids    Frequent headaches    Hypertension    Urine incontinence     Current Outpatient Medications  Medication Sig Dispense Refill   albuterol (VENTOLIN HFA) 108 (90 Base) MCG/ACT inhaler  Inhale 2 puffs into the lungs every 6 (six) hours as needed for wheezing or shortness of breath. 8 g 0   amLODipine-olmesartan (AZOR) 10-40 MG tablet Take 1 tablet by mouth daily. 90 tablet 0   apixaban (ELIQUIS) 5 MG TABS tablet Take 1 tablet (5 mg total) by mouth 2 (two) times daily. 60 tablet 5   benzonatate (TESSALON) 100 MG capsule Take 1 capsule (100 mg total) by mouth 3 (three) times daily as needed for cough. 30 capsule 0   diclofenac (VOLTAREN) 50 MG EC tablet Take 50 mg by mouth as needed.     diltiazem (CARDIZEM) 30 MG tablet Take 1 tablet by mouth up to 3X a day as needed for a heart rate of 110 or greater. 30 tablet 5   diltiazem (TIADYLT ER) 360 MG 24 hr capsule Take 1 capsule (360 mg total) by mouth daily. 90 capsule 3   doxycycline (VIBRA-TABS) 100 MG tablet Take 1 tablet (100 mg total) by mouth 2 (two) times daily. 20 tablet 0   furosemide (LASIX) 20 MG tablet Take 1 tablet (20 mg total) by mouth daily. 30 tablet 0   gabapentin (NEURONTIN) 100 MG capsule Take 100-200 mg by mouth at bedtime.     Insulin Pen Needle 31G X 5 MM MISC BD Pen Needles- brand specific Inject insulin via insulin pen 6 x daily 30 each 1   metFORMIN (GLUCOPHAGE) 500 MG tablet TAKE 1 TABLET BY MOUTH 2 TIMES DAILY WITH A MEAL. 180 tablet 0  omeprazole (PRILOSEC) 20 MG capsule Take 1 capsule (20 mg total) by mouth daily. 90 capsule 1   ondansetron (ZOFRAN) 4 MG tablet TAKE 1 TABLET BY MOUTH EVERY 8 HOURS AS NEEDED FOR NAUSEA AND VOMITING 20 tablet 0   OZEMPIC, 0.25 OR 0.5 MG/DOSE, 2 MG/1.5ML SOPN Inject 0.25 mg into the skin once a week. 1.5 mL 0   predniSONE (DELTASONE) 20 MG tablet Take 2 tablets (40 mg total) by mouth daily with breakfast. 10 tablet 0   rosuvastatin (CRESTOR) 10 MG tablet TAKE 1 TABLET BY MOUTH EVERY DAY 90 tablet 0   venlafaxine XR (EFFEXOR XR) 75 MG 24 hr capsule Take 1 capsule (75 mg total) by mouth daily with breakfast. 90 capsule 1   No current facility-administered medications for this  visit.    No Known Allergies  Family History  Problem Relation Age of Onset   Congestive Heart Failure Mother    Diabetes type I Father    Diabetes Sister    Diabetes Sister    Arthritis Maternal Grandmother    Alcohol abuse Maternal Grandfather    Diabetes Paternal Grandmother    Diabetes Paternal Grandfather    Diabetes Paternal Aunt    Diabetes Paternal Uncle     Social History   Socioeconomic History   Marital status: Married    Spouse name: Not on file   Number of children: Not on file   Years of education: Not on file   Highest education level: Not on file  Occupational History   Not on file  Tobacco Use   Smoking status: Former    Current packs/day: 0.00    Average packs/day: 0.3 packs/day for 11.9 years (3.6 ttl pk-yrs)    Types: Cigarettes    Start date: 01/14/2009    Quit date: 12/21/2020    Years since quitting: 2.9   Smokeless tobacco: Never  Vaping Use   Vaping status: Never Used  Substance and Sexual Activity   Alcohol use: Yes    Comment: occasional   Drug use: No   Sexual activity: Yes  Other Topics Concern   Not on file  Social History Narrative   Not on file   Social Determinants of Health   Financial Resource Strain: Not on file  Food Insecurity: Not on file  Transportation Needs: Not on file  Physical Activity: Not on file  Stress: Not on file  Social Connections: Not on file  Intimate Partner Violence: Not on file     Constitutional: Pt reports headache. Denies fever, malaise, fatigue, or abrupt weight changes.  HEENT: Pt reports ear fullness and sore throat. Denies eye pain, eye redness, ear pain, ringing in the ears, wax buildup, runny nose, nasal congestion, bloody nose. Respiratory: Pt reports cough and shortness of  breath. Denies difficulty breathing.   Cardiovascular: Denies chest pain, chest tightness, palpitations or swelling in the hands or feet.  Gastrointestinal: Denies abdominal pain, bloating, constipation, diarrhea  or blood in the stool.   No other specific complaints in a complete review of systems (except as listed in HPI above).      Objective:   Physical Exam BP (!) 142/82   Pulse 72   Ht 5\' 3"  (1.6 m)   Wt 187 lb 3.2 oz (84.9 kg)   LMP 03/22/2014   SpO2 96%   BMI 33.16 kg/m   Wt Readings from Last 3 Encounters:  09/19/23 193 lb (87.5 kg)  03/08/23 185 lb (83.9 kg)  02/20/23 195 lb (88.5 kg)  General: Appears her stated age, obese, appears unwell but in NAD. Skin: Warm, dry and intact. HEENT: Head: normal shape and size mild maxillary and frontal sinus tenderness noted; Eyes: sclera white, no icterus, conjunctiva pink, PERRLA and EOMs intact; Ears: Tm's gray and intact, normal light reflex; Nose: mucosa pink and moist, septum midline; Throat/Mouth: Teeth present, mucosa pink and moist, no exudate, lesions or ulcerations noted.  Neck: No adenopathy noted but tender with palpation. Cardiovascular: Normal rate and rhythm. S1,S2 noted.  No murmur, rubs or gallops noted.  Pulmonary/Chest: Normal effort and positive vesicular breath sounds with bilateral expiratory wheezing noted. No respiratory distress. No rales or ronchi noted.  Neurological: Alert and oriented.   BMET    Component Value Date/Time   NA 140 09/19/2023 0927   NA 141 02/09/2021 0917   K 4.3 09/19/2023 0927   CL 101 09/19/2023 0927   CO2 28 09/19/2023 0927   GLUCOSE 173 (H) 09/19/2023 0927   BUN 15 09/19/2023 0927   BUN 15 02/09/2021 0917   CREATININE 0.74 09/19/2023 0927   CALCIUM 9.6 09/19/2023 0927   GFRNONAA >60 02/20/2023 0857   GFRAA 118 02/09/2021 0917    Lipid Panel     Component Value Date/Time   CHOL 227 (H) 09/19/2023 0927   TRIG 154 (H) 09/19/2023 0927   HDL 59 09/19/2023 0927   CHOLHDL 3.8 09/19/2023 0927   VLDL 57.6 (H) 06/22/2015 0746   LDLCALC 139 (H) 09/19/2023 0927    CBC    Component Value Date/Time   WBC 6.4 09/19/2023 0927   RBC 4.43 09/19/2023 0927   HGB 14.0 09/19/2023 0927    HGB 14.7 02/09/2021 0917   HCT 41.7 09/19/2023 0927   HCT 42.0 02/09/2021 0917   PLT 287 09/19/2023 0927   PLT 275 02/09/2021 0917   MCV 94.1 09/19/2023 0927   MCV 92 02/09/2021 0917   MCH 31.6 09/19/2023 0927   MCHC 33.6 09/19/2023 0927   RDW 12.7 09/19/2023 0927   RDW 11.9 02/09/2021 0917   LYMPHSABS 2.1 01/22/2021 0339   MONOABS 0.6 01/22/2021 0339   EOSABS 0.1 01/22/2021 0339   BASOSABS 0.1 01/22/2021 0339    Hgb A1C Lab Results  Component Value Date   HGBA1C 6.9 (H) 09/19/2023            Assessment & Plan:  Assessment and Plan    Bacterial Sinusitis Patient has been on prednisone, albuterol, and Tessalon Perles, but has missed three days of doxycycline due to a misunderstanding at the pharmacy. Patient reports some improvement but still has symptoms. -Rx for Augmentin 875-125 mg twice daily x 10 days -Continue current medications (prednisone, albuterol, Tessalon Perles). -80 mg Depo-Medrol IM x 1 -Order chest x-ray to rule out pneumonia.   Work Absence Patient is unable to work due to current symptoms. -Provide work note excusing patient until the following Monday.      RTC in 3 months for follow-up of chronic conditions Nicki Reaper, NP

## 2023-11-28 NOTE — Patient Instructions (Signed)

## 2023-12-04 ENCOUNTER — Other Ambulatory Visit: Payer: Self-pay | Admitting: Internal Medicine

## 2023-12-05 NOTE — Telephone Encounter (Signed)
Requested medication (s) are due for refill today: yes  Requested medication (s) are on the active medication list: yes  Last refill:  11/05/23 #30  Future visit scheduled: yes  Notes to clinic:  pharmacy requesting 90 day refill- previously ordered #30 no refills   Requested Prescriptions  Pending Prescriptions Disp Refills   furosemide (LASIX) 20 MG tablet [Pharmacy Med Name: FUROSEMIDE 20 MG TABLET] 90 tablet 1    Sig: TAKE 1 TABLET BY MOUTH EVERY DAY     Cardiovascular:  Diuretics - Loop Failed - 12/05/2023  8:27 AM      Failed - Mg Level in normal range and within 180 days    Magnesium  Date Value Ref Range Status  05/24/2021 2.0 1.7 - 2.4 mg/dL Final    Comment:    Performed at [facility], [address] [place], [place] [address]         Passed - K in normal range and within 180 days    Potassium  Date Value Ref Range Status  09/19/2023 4.3 3.5 - 5.3 mmol/L Final         Passed - Ca in normal range and within 180 days    Calcium  Date Value Ref Range Status  09/19/2023 9.6 8.6 - 10.4 mg/dL Final         Passed - Na in normal range and within 180 days    Sodium  Date Value Ref Range Status  09/19/2023 140 135 - 146 mmol/L Final  02/09/2021 141 134 - 144 mmol/L Final         Passed - Cr in normal range and within 180 days    Creat  Date Value Ref Range Status  09/19/2023 0.74 0.50 - 1.03 mg/dL Final   Creatinine, Urine  Date Value Ref Range Status  09/19/2023 81 20 - 275 mg/dL Final         Passed - Cl in normal range and within 180 days    Chloride  Date Value Ref Range Status  09/19/2023 101 98 - 110 mmol/L Final         Passed - Last BP in normal range    BP Readings from Last 1 Encounters:  11/28/23 138/78         Passed - Valid encounter within last 6 months    Recent Outpatient Visits           1 week ago Acute bacterial sinusitis   [facility] [place], Salvadore Oxford, NP   2 months ago Encounter for  general adult medical examination with abnormal findings   [facility] [place], Salvadore Oxford, NP   9 months ago Acute bacterial sinusitis   [facility] [place], Oswaldo [place], [place]   1 year ago Encounter for general adult medical examination with abnormal findings   [place] [facility] [place], [place] W, NP   2 years ago Type 2 diabetes mellitus without complication, without long-term current use of insulin [facility])   [facility], Salvadore Oxford, NP       Future Appointments             In 3 months Baity, Salvadore Oxford, NP [facility], [facility]

## 2024-01-24 ENCOUNTER — Ambulatory Visit: Payer: No Typology Code available for payment source | Admitting: Podiatry

## 2024-01-24 ENCOUNTER — Encounter: Payer: Self-pay | Admitting: Podiatry

## 2024-01-24 DIAGNOSIS — L600 Ingrowing nail: Secondary | ICD-10-CM | POA: Diagnosis not present

## 2024-01-24 NOTE — Progress Notes (Signed)
Subjective:   Patient ID: Wanda Aguilar, female   DOB: 58 y.o.   MRN: [id number]   HPI Patient presents stating that she has had a very painful ingrown toenail right big toe and that it has been making it hard to wear shoe gear comfortably.  Also complains about swelling in her ankles both it has been present for a while with no other issue   ROS      Objective:  Physical Exam  Neurovascular status intact incurvated lateral border right big toe painful when pressed history of correction of the medial border with just a generalized nail damage and abnormal nail structure     Assessment:  Ingrown toenail deformity right hallux lateral border or gross heart moderate swelling of both feet     Plan:  H&P reviewed and went ahead today discussed correction of the ingrown right patient wants this done I explained procedure risk and patient at this point had the right big toe infiltrated after she signed consent form 60 mg like Marcaine mixture sterile prep done using sterile instrumentation removed the lateral border exposed matrix applied phenol 3 applications 30 seconds followed by alcohol lavage sterile dressing gave instructions on soaks wear dressing for 24 hours take it off earlier if throbbing were to occur and encouraged her to call with questions concerns with this.  Also dispensed ankle compression stockings to try to help with the swelling she is experiencing

## 2024-01-24 NOTE — Patient Instructions (Signed)

## 2024-01-29 ENCOUNTER — Telehealth: Payer: Self-pay | Admitting: Podiatry

## 2024-01-29 NOTE — Telephone Encounter (Signed)
 Pt called stating her R great toe is starting to look red with a pocket of pus on it. Ingrown was removed on 1/31 and she wanted to know if a an appt should be scheduled. She has been following the soaking instructions but has not applied any ointments/medication on it. States that the last time she soaked the toe, it started stinging but the previous times there hasn't been any pain.

## 2024-02-06 ENCOUNTER — Other Ambulatory Visit: Payer: Self-pay | Admitting: Internal Medicine

## 2024-02-07 NOTE — Telephone Encounter (Signed)
Requested medications are due for refill today.  yes  Requested medications are on the active medications list.  yes  Last refill. 09/19/2023 1.33mL  Future visit scheduled.   yes  Notes to clinic.  Please review for refill.    Requested Prescriptions  Pending Prescriptions Disp Refills   OZEMPIC, 0.25 OR 0.5 MG/DOSE, 2 MG/3ML SOPN [Pharmacy Med Name: OZEMPIC 0.25-0.5 MG/DOSE PEN]      Sig: INJECT 0.25MG  INTO THE SKIN ONE TIME PER WEEK     Endocrinology:  Diabetes - GLP-1 Receptor Agonists - semaglutide Failed - 02/07/2024  1:10 PM      Failed - HBA1C in normal range and within 180 days    Hgb A1c MFr Bld  Date Value Ref Range Status  09/19/2023 6.9 (H) <5.7 % of total Hgb Final    Comment:    For someone without known diabetes, a hemoglobin A1c value of 6.5% or greater indicates that they may have  diabetes and this should be confirmed with a follow-up  test. . For someone with known diabetes, a value <7% indicates  that their diabetes is well controlled and a value  greater than or equal to 7% indicates suboptimal  control. A1c targets should be individualized based on  duration of diabetes, age, comorbid conditions, and  other considerations. . Currently, no consensus exists regarding use of hemoglobin A1c for diagnosis of diabetes for children. .          Passed - Cr in normal range and within 360 days    Creat  Date Value Ref Range Status  09/19/2023 0.74 0.50 - 1.03 mg/dL Final   Creatinine, Urine  Date Value Ref Range Status  09/19/2023 81 20 - 275 mg/dL Final         Passed - Valid encounter within last 6 months    Recent Outpatient Visits           2 months ago Acute bacterial sinusitis   [place] [facility] [place], Salvadore Oxford, NP   4 months ago Encounter for general adult medical examination with abnormal findings   [place] [facility] [place], Salvadore Oxford, NP   11 months ago Acute bacterial sinusitis   [place], Oswaldo Conroy, PA-C   1 year ago Encounter for general adult medical examination with abnormal findings   [place] [facility] [place], [place] W, NP   2 years ago Type 2 diabetes mellitus without complication, without long-term current use of insulin [facility])   [place] [facility] [place], Salvadore Oxford, NP       Future Appointments             In 4 days [place], Salvadore Oxford, NP [place] [facility], [facility]   In 1 month [place], Salvadore Oxford, NP [place] [facility], [place]            Signed Prescriptions Disp Refills   amLODipine-olmesartan (AZOR) 10-40 MG tablet 90 tablet 0    Sig: TAKE 1 TABLET BY MOUTH EVERY DAY     Cardiovascular: CCB + ARB Combos Passed - 02/07/2024  1:10 PM      Passed - K in normal range and within 180 days    Potassium  Date Value Ref Range Status  09/19/2023 4.3 3.5 - 5.3 mmol/L Final         Passed - Cr in normal range and  within 180 days    Creat  Date Value Ref Range Status  09/19/2023 0.74 0.50 - 1.03 mg/dL Final   Creatinine, Urine  Date Value Ref Range Status  09/19/2023 81 20 - 275 mg/dL Final         Passed - Na in normal range and within 180 days    Sodium  Date Value Ref Range Status  09/19/2023 140 135 - 146 mmol/L Final  02/09/2021 141 134 - 144 mmol/L Final         Passed - Patient is not pregnant      Passed - Last BP in normal range    BP Readings from Last 1 Encounters:  11/28/23 138/78         Passed - Valid encounter within last 6 months    Recent Outpatient Visits           2 months ago Acute bacterial sinusitis   [place] [facility] [place], Salvadore Oxford, NP   4 months ago Encounter for general adult medical examination with abnormal findings   [place] [facility] [place], Salvadore Oxford, NP   11 months ago Acute bacterial sinusitis   [place] [facility], Oswaldo Conroy, PA-C   1 year ago  Encounter for general adult medical examination with abnormal findings   [place] [facility] [place], [place] W, NP   2 years ago Type 2 diabetes mellitus without complication, without long-term current use of insulin [facility])   [place] [facility] [place], Salvadore Oxford, NP       Future Appointments             In 4 days [place], Salvadore Oxford, NP [place] [facility], [facility]   In 1 month [place], Salvadore Oxford, NP [facility], [facility] [place]

## 2024-02-07 NOTE — Telephone Encounter (Signed)
Requested Prescriptions  Pending Prescriptions Disp Refills   amLODipine-olmesartan (AZOR) 10-40 MG tablet [Pharmacy Med Name: AMLODIPINE-OLMESARTAN 10-40 MG] 90 tablet 0    Sig: TAKE 1 TABLET BY MOUTH EVERY DAY     Cardiovascular: CCB + ARB Combos Passed - 02/07/2024  1:10 PM      Passed - K in normal range and within 180 days    Potassium  Date Value Ref Range Status  09/19/2023 4.3 3.5 - 5.3 mmol/L Final         Passed - Cr in normal range and within 180 days    Creat  Date Value Ref Range Status  09/19/2023 0.74 0.50 - 1.03 mg/dL Final   Creatinine, Urine  Date Value Ref Range Status  09/19/2023 81 20 - 275 mg/dL Final         Passed - Na in normal range and within 180 days    Sodium  Date Value Ref Range Status  09/19/2023 140 135 - 146 mmol/L Final  02/09/2021 141 134 - 144 mmol/L Final         Passed - Patient is not pregnant      Passed - Last BP in normal range    BP Readings from Last 1 Encounters:  11/28/23 138/78         Passed - Valid encounter within last 6 months    Recent Outpatient Visits           2 months ago Acute bacterial sinusitis   [place] [facility] [place], Salvadore Oxford, NP   4 months ago Encounter for general adult medical examination with abnormal findings   [place] [facility] [place], Salvadore Oxford, NP   11 months ago Acute bacterial sinusitis   [place] [facility], Oswaldo Conroy, [place]   1 year ago Encounter for general adult medical examination with abnormal findings   [place] [facility] [place], Salvadore Oxford, NP   2 years ago Type 2 diabetes mellitus without complication, without long-term current use of insulin (HCC)   [place] [facility] [place], Salvadore Oxford, NP       Future Appointments             In 4 days [place], Salvadore Oxford, NP [place] [facility], [facility]   In 1 month [place], Salvadore Oxford, NP [place] [facility], [facility]              OZEMPIC, 0.25 OR 0.5 MG/DOSE, 2 MG/3ML SOPN [Pharmacy Med Name: OZEMPIC 0.25-0.5 MG/DOSE PEN]      Sig: INJECT 0.25MG  INTO THE SKIN ONE TIME PER WEEK     Endocrinology:  Diabetes - GLP-1 Receptor Agonists - semaglutide Failed - 02/07/2024  1:10 PM      Failed - HBA1C in normal range and within 180 days    Hgb A1c MFr Bld  Date Value Ref Range Status  09/19/2023 6.9 (H) <5.7 % of total Hgb Final    Comment:    For someone without known diabetes, a hemoglobin A1c value of 6.5% or greater indicates that they may have  diabetes and this should be confirmed with a follow-up  test. . For someone with known diabetes, a value <7% indicates  that their diabetes is well controlled and a value  greater than or equal to 7% indicates suboptimal  control. A1c targets should be individualized based on  duration of diabetes, age,  comorbid conditions, and  other considerations. . Currently, no consensus exists regarding use of hemoglobin A1c for diagnosis of diabetes for children. .          Passed - Cr in normal range and within 360 days    Creat  Date Value Ref Range Status  09/19/2023 0.74 0.50 - 1.03 mg/dL Final   Creatinine, Urine  Date Value Ref Range Status  09/19/2023 81 20 - 275 mg/dL Final         Passed - Valid encounter within last 6 months    Recent Outpatient Visits           2 months ago Acute bacterial sinusitis   [place] [facility] [place], Salvadore Oxford, NP   4 months ago Encounter for general adult medical examination with abnormal findings   [place] [facility] [place], Salvadore Oxford, NP   11 months ago Acute bacterial sinusitis   [place] [facility], Oswaldo Conroy, PA-C   1 year ago Encounter for general adult medical examination with abnormal findings   [place] [facility] [place], [place] W, NP   2 years ago Type 2 diabetes mellitus without complication, without long-term current  use of insulin [facility])   [place] [facility] [place], Salvadore Oxford, NP       Future Appointments             In 4 days [place], Salvadore Oxford, NP [place] [facility], [facility]   In 1 month [place], Salvadore Oxford, NP [facility], [place]

## 2024-02-11 ENCOUNTER — Encounter: Payer: Self-pay | Admitting: Internal Medicine

## 2024-02-11 ENCOUNTER — Ambulatory Visit: Payer: No Typology Code available for payment source | Admitting: Internal Medicine

## 2024-02-11 VITALS — BP 140/80 | Ht 63.0 in | Wt 201.0 lb

## 2024-02-11 DIAGNOSIS — E66812 Obesity, class 2: Secondary | ICD-10-CM

## 2024-02-11 DIAGNOSIS — E0849 Diabetes mellitus due to underlying condition with other diabetic neurological complication: Secondary | ICD-10-CM | POA: Insufficient documentation

## 2024-02-11 DIAGNOSIS — I10 Essential (primary) hypertension: Secondary | ICD-10-CM

## 2024-02-11 DIAGNOSIS — R600 Localized edema: Secondary | ICD-10-CM

## 2024-02-11 DIAGNOSIS — Z6835 Body mass index (BMI) 35.0-35.9, adult: Secondary | ICD-10-CM

## 2024-02-11 MED ORDER — FUROSEMIDE 20 MG PO TABS: 40.0000 mg | ORAL_TABLET | Freq: Every day | ORAL | Status: DC

## 2024-02-11 NOTE — Progress Notes (Signed)
Subjective:    Patient ID: Wanda Aguilar, female    DOB: [birth year], 58 y.o.   MRN: [id number]  HPI  Discussed the use of AI scribe software for clinical note transcription with the patient, who gave verbal consent to proceed.    Wanda Aguilar is a 58 year old female with hypertension and atrial fibrillation who presents with persistent lower extremity swelling.  She has experienced lower extremity swelling for six weeks, initially sporadic and resolving by morning, but now persistent. The swelling is associated with dull pain, rated 4/10. Non-prescription compression socks have not alleviated the swelling.  She has gained weight from 187 to 201 pounds. She experiences shortness of breath when walking short distances, attributing it to her weight. Her last echocardiogram in February 2022 showed grade one diastolic dysfunction. She is concerned about her family history of congestive heart failure, as her mother has the condition.  She is currently taking furosemide daily for fluid management related to her hypertension, amlodipine for blood pressure, and gabapentin at bedtime for foot pain, which aids her sleep. She takes two 100 mg tablets of gabapentin as one does not suffice. She is mindful of her salt intake, avoiding added salt but consuming processed foods like canned pasta, which may be high in sodium.    Review of Systems     Past Medical History:  Diagnosis Date   Atrial fibrillation (HCC)    Fibroids    Frequent headaches    Hypertension    Urine incontinence     Current Outpatient Medications  Medication Sig Dispense Refill   albuterol (VENTOLIN HFA) 108 (90 Base) MCG/ACT inhaler Inhale 2 puffs into the lungs every 6 (six) hours as needed for wheezing or shortness of breath. 8 g 0   amLODipine-olmesartan (AZOR) 10-40 MG tablet TAKE 1 TABLET BY MOUTH EVERY DAY 90 tablet 0   amoxicillin-clavulanate (AUGMENTIN) 875-125 MG tablet Take 1 tablet by mouth 2 (two) times  daily. 20 tablet 0   apixaban (ELIQUIS) 5 MG TABS tablet Take 1 tablet (5 mg total) by mouth 2 (two) times daily. 60 tablet 5   benzonatate (TESSALON) 100 MG capsule Take 1 capsule (100 mg total) by mouth 3 (three) times daily as needed for cough. 30 capsule 0   diclofenac (VOLTAREN) 50 MG EC tablet Take 50 mg by mouth as needed.     diltiazem (CARDIZEM) 30 MG tablet Take 1 tablet by mouth up to 3X a day as needed for a heart rate of 110 or greater. 30 tablet 5   diltiazem (TIADYLT ER) 360 MG 24 hr capsule Take 1 capsule (360 mg total) by mouth daily. 90 capsule 3   furosemide (LASIX) 20 MG tablet TAKE 1 TABLET BY MOUTH EVERY DAY 90 tablet 0   gabapentin (NEURONTIN) 100 MG capsule Take 100-200 mg by mouth at bedtime.     Insulin Pen Needle 31G X 5 MM MISC BD Pen Needles- brand specific Inject insulin via insulin pen 6 x daily 30 each 1   metFORMIN (GLUCOPHAGE) 500 MG tablet TAKE 1 TABLET BY MOUTH 2 TIMES DAILY WITH A MEAL. 180 tablet 0   omeprazole (PRILOSEC) 20 MG capsule Take 1 capsule (20 mg total) by mouth daily. 90 capsule 1   ondansetron (ZOFRAN) 4 MG tablet TAKE 1 TABLET BY MOUTH EVERY 8 HOURS AS NEEDED FOR NAUSEA AND VOMITING 20 tablet 0   OZEMPIC, 0.25 OR 0.5 MG/DOSE, 2 MG/3ML SOPN INJECT 0.25MG  INTO THE SKIN ONE TIME PER  WEEK 9 mL 0   predniSONE (DELTASONE) 20 MG tablet Take 2 tablets (40 mg total) by mouth daily with breakfast. 10 tablet 0   rosuvastatin (CRESTOR) 10 MG tablet TAKE 1 TABLET BY MOUTH EVERY DAY 90 tablet 0   venlafaxine XR (EFFEXOR XR) 75 MG 24 hr capsule Take 1 capsule (75 mg total) by mouth daily with breakfast. 90 capsule 1   No current facility-administered medications for this visit.    No Known Allergies  Family History  Problem Relation Age of Onset   Congestive Heart Failure Mother    Diabetes type I Father    Diabetes Sister    Diabetes Sister    Arthritis Maternal Grandmother    Alcohol abuse Maternal Grandfather    Diabetes Paternal Grandmother     Diabetes Paternal Grandfather    Diabetes Paternal Aunt    Diabetes Paternal Uncle     Social History   Socioeconomic History   Marital status: Married    Spouse name: Not on file   Number of children: Not on file   Years of education: Not on file   Highest education level: Not on file  Occupational History   Not on file  Tobacco Use   Smoking status: Former    Current packs/day: 0.00    Average packs/day: 0.3 packs/day for 11.9 years (3.6 ttl pk-yrs)    Types: Cigarettes    Start date: 01/14/2009    Quit date: 12/21/2020    Years since quitting: 3.1   Smokeless tobacco: Never  Vaping Use   Vaping status: Never Used  Substance and Sexual Activity   Alcohol use: Yes    Comment: occasional   Drug use: No   Sexual activity: Yes  Other Topics Concern   Not on file  Social History Narrative   Not on file   Social Drivers of Health   Financial Resource Strain: Not on file  Food Insecurity: Not on file  Transportation Needs: Not on file  Physical Activity: Not on file  Stress: Not on file  Social Connections: Not on file  Intimate Partner Violence: Not on file     Constitutional: Patient reports intermittent headaches, weight gain.  Denies fever, malaise, fatigue.  HEENT: Denies eye pain, eye redness, ear pain, ringing in the ears, wax buildup, runny nose, nasal congestion, bloody nose, or sore throat. Respiratory: Pt reports intermittent shortness of breath. Denies difficulty breathing, cough or sputum production.   Cardiovascular: Patient reports swelling in legs.  Denies chest pain, chest tightness, palpitations or swelling in the hands.  Neurological: Patient reports insomnia, lightheadedness.  Denies difficulty with memory, difficulty with speech or problems with balance and coordination.   No other specific complaints in a complete review of systems (except as listed in HPI above).  Objective:   Physical Exam  BP (!) 140/80   Ht 5\' 3"  (1.6 m)   Wt 201 lb  (91.2 kg)   LMP 03/22/2014   BMI 35.61 kg/m    Wt Readings from Last 3 Encounters:  11/28/23 187 lb 3.2 oz (84.9 kg)  09/19/23 193 lb (87.5 kg)  03/08/23 185 lb (83.9 kg)    General: Appears her stated age, obese, in NAD. Cardiovascular: Normal rate and rhythm. S1,S2 noted.  No murmur, rubs or gallops noted. No 1+ pitting BLE edema. No carotid bruits noted. Pulmonary/Chest: Normal effort and positive vesicular breath sounds. No respiratory distress. No wheezes, rales or ronchi noted.  Musculoskeletal: No difficulty with gait.  Neurological: Alert  and oriented.   BMET    Component Value Date/Time   NA 140 09/19/2023 0927   NA 141 02/09/2021 0917   K 4.3 09/19/2023 0927   CL 101 09/19/2023 0927   CO2 28 09/19/2023 0927   GLUCOSE 173 (H) 09/19/2023 0927   BUN 15 09/19/2023 0927   BUN 15 02/09/2021 0917   CREATININE 0.74 09/19/2023 0927   CALCIUM 9.6 09/19/2023 0927   GFRNONAA >60 02/20/2023 0857   GFRAA 118 02/09/2021 0917    Lipid Panel     Component Value Date/Time   CHOL 227 (H) 09/19/2023 0927   TRIG 154 (H) 09/19/2023 0927   HDL 59 09/19/2023 0927   CHOLHDL 3.8 09/19/2023 0927   VLDL 57.6 (H) 06/22/2015 0746   LDLCALC 139 (H) 09/19/2023 0927    CBC    Component Value Date/Time   WBC 6.4 09/19/2023 0927   RBC 4.43 09/19/2023 0927   HGB 14.0 09/19/2023 0927   HGB 14.7 02/09/2021 0917   HCT 41.7 09/19/2023 0927   HCT 42.0 02/09/2021 0917   PLT 287 09/19/2023 0927   PLT 275 02/09/2021 0917   MCV 94.1 09/19/2023 0927   MCV 92 02/09/2021 0917   MCH 31.6 09/19/2023 0927   MCHC 33.6 09/19/2023 0927   RDW 12.7 09/19/2023 0927   RDW 11.9 02/09/2021 0917   LYMPHSABS 2.1 01/22/2021 0339   MONOABS 0.6 01/22/2021 0339   EOSABS 0.1 01/22/2021 0339   BASOSABS 0.1 01/22/2021 0339    Hgb A1C Lab Results  Component Value Date   HGBA1C 6.9 (H) 09/19/2023           Assessment & Plan:  Assessment and Plan    Lower Extremity Edema Persistent for six  weeks, with mild pain. Possible causes include venous insufficiency, early heart failure, medication side effects (amlodipine, gabapentin), and lifestyle factors (weight, salt intake). -Increase Lasix to 40mg  daily, either as a single dose in the morning or split into two doses. -Consider discontinuing gabapentin if possible, as it may be contributing to edema. -Contact the office if no improvement in swelling by next week. -Recommend repeating echocardiogram with cardiologist, Dr. Lalla Brothers, to assess for possible heart failure.   Hypertension Controlled on amlodipine-olmesartan. -Reinforced DASH diet and exercise for weight loss   RTC in 1 months, follow-up chronic conditions Nicki Reaper, NP

## 2024-02-11 NOTE — Assessment & Plan Note (Signed)
 Encouraged diet and exercise for weight loss ?

## 2024-02-11 NOTE — Patient Instructions (Signed)
 Peripheral Edema  Peripheral edema is swelling that is caused by a buildup of fluid. Peripheral edema most often affects the lower legs, ankles, and feet. It can also develop in the arms, hands, and face. The area of the body that has peripheral edema will look swollen. It may also feel heavy or warm. Your clothes may start to feel tight. Pressing on the area may make a temporary dent in your skin (pitting edema). You may not be able to move your swollen arm or leg as much as usual. There are many causes of peripheral edema. It can happen because of a complication of other conditions such as heart failure, kidney disease, or a problem with your circulation. It also can be a side effect of certain medicines or happen because of an infection. It often happens to women during pregnancy. Sometimes, the cause is not known. Follow these instructions at home: Managing pain, stiffness, and swelling  Raise (elevate) your legs while you are sitting or lying down. Move around often to prevent stiffness and to reduce swelling. Do not sit or stand for long periods of time. Do not wear tight clothing. Do not wear garters on your upper legs. Exercise your legs to get your circulation going. This helps to move the fluid back into your blood vessels, and it may help the swelling go down. Wear compression stockings as told by your health care provider. These stockings help to prevent blood clots and reduce swelling in your legs. It is important that these are the correct size. These stockings should be prescribed by your doctor to prevent possible injuries. If elastic bandages or wraps are recommended, use them as told by your health care provider. Medicines Take over-the-counter and prescription medicines only as told by your health care provider. Your health care provider may prescribe medicine to help your body get rid of excess water (diuretic). Take this medicine if you are told to take it. General  instructions Eat a low-salt (low-sodium) diet as told by your health care provider. Sometimes, eating less salt may reduce swelling. Pay attention to any changes in your symptoms. Moisturize your skin daily to help prevent skin from cracking and draining. Keep all follow-up visits. This is important. Contact a health care provider if: You have a fever. You have swelling in only one leg. You have increased swelling, redness, or pain in one or both of your legs. You have drainage or sores at the area where you have edema. Get help right away if: You have edema that starts suddenly or is getting worse, especially if you are pregnant or have a medical condition. You develop shortness of breath, especially when you are lying down. You have pain in your chest or abdomen. You feel weak. You feel like you will faint. These symptoms may be an emergency. Get help right away. Call 911. Do not wait to see if the symptoms will go away. Do not drive yourself to the hospital. Summary Peripheral edema is swelling that is caused by a buildup of fluid. Peripheral edema most often affects the lower legs, ankles, and feet. Move around often to prevent stiffness and to reduce swelling. Do not sit or stand for long periods of time. Pay attention to any changes in your symptoms. Contact a health care provider if you have edema that starts suddenly or is getting worse, especially if you are pregnant or have a medical condition. Get help right away if you develop shortness of breath, especially when lying down.  This information is not intended to replace advice given to you by your health care provider. Make sure you discuss any questions you have with your health care provider. Document Revised: 08/14/2021 Document Reviewed: 08/14/2021 Elsevier Patient Education  2024 [employer].

## 2024-02-19 ENCOUNTER — Other Ambulatory Visit: Payer: Self-pay | Admitting: Cardiology

## 2024-02-25 ENCOUNTER — Ambulatory Visit: Payer: Self-pay | Admitting: Internal Medicine

## 2024-02-25 NOTE — Telephone Encounter (Signed)
 Will discuss at upcoming appointment

## 2024-02-25 NOTE — Telephone Encounter (Signed)
  Chief Complaint: Patient is calling to give progress report to provider: Symptoms: ankle/feet swelling- some better, still goes down at night- 40% better- but not gone with doubling medication and elevation, compression during day Frequency: 1-2 weeks Pertinent Negatives: Patient denies chest pain or Afib- but does admit sometimes get SOB with increased activity Disposition: [] ED /[] Urgent Care (no appt availability in office) / [] Appointment(In office/virtual)/ []  [facility]/ [] Home Care/ [] Refused Recommended Disposition /[] [facility]/ [x]  Follow-up with PCP Additional Notes: Patient calling to update provider- she was advised to call with response to plan after last visit.  Ready for next steps  Copied from CRM [id number]. Topic: Clinical - Red Word Triage >> Feb 25, 2024 11:29 AM Payton Doughty wrote: Red Word that prompted transfer to Nurse Triage: significant swelling of ankles and feet Reason for Disposition  MILD or MODERATE ankle swelling (e.g., can't move joint normally, can't do usual activities) (Exceptions: Itchy, localized swelling; swelling is chronic.)  Answer Assessment - Initial Assessment Questions 1. LOCATION: "Which ankle is swollen?" "Where is the swelling?"     Compression socks are helping and patient has doubled up on her medication- fluid pill, Patient is using Elliquis- only takes one pill instead of two. R ankle seems to be worse- swelling is better in am- goes down during the night 2. ONSET: "When did the swelling start?"     Was seen 1-2 weeks ago 3. SWELLING: "How bad is the swelling?" Or, "How large is it?" (e.g., mild, moderate, severe; size of localized swelling)    - NONE: No joint swelling.   - LOCALIZED: Localized; small area of puffy or swollen skin (e.g., insect bite, skin irritation).   - MILD: Joint looks or feels mildly swollen or puffy.   - MODERATE: Swollen; interferes with normal activities (e.g., work or school); decreased range of  movement; may be limping.   - SEVERE: Very swollen; can't move swollen joint at all; limping a lot or unable to walk.     Mild/moderate- compression is helping legs, patient states helped 40% with ankles- still significant 4. PAIN: "Is there any pain?" If Yes, ask: "How bad is it?" (Scale 1-10; or mild, moderate, severe)   - NONE (0): no pain.   - MILD (1-3): doesn't interfere with normal activities.    - MODERATE (4-7): interferes with normal activities (e.g., work or school) or awakens from sleep, limping.    - SEVERE (8-10): excruciating pain, unable to do any normal activities, unable to walk.      Sporadic pain- achy feeling 5. CAUSE: "What do you think caused the ankle swelling?"     fluid 6. OTHER SYMPTOMS: "Do you have any other symptoms?" (e.g., fever, chest pain, difficulty breathing, calf pain)     Feels a little winded from time to time, Afib has been fine  Protocols used: Ankle Swelling-A-AH

## 2024-02-25 NOTE — Telephone Encounter (Signed)
 LM for patient to return call.

## 2024-02-25 NOTE — Telephone Encounter (Signed)
 Just to clarify, she is taking 40 mg of lasix daily, not 80 correct? Did she stop the gabapentin? She may need to follow up in office if symptoms persist.

## 2024-02-28 ENCOUNTER — Encounter: Payer: Self-pay | Admitting: Internal Medicine

## 2024-02-28 ENCOUNTER — Ambulatory Visit: Admitting: Internal Medicine

## 2024-02-28 VITALS — BP 138/78 | Ht 63.0 in | Wt 198.8 lb

## 2024-02-28 DIAGNOSIS — R600 Localized edema: Secondary | ICD-10-CM | POA: Diagnosis not present

## 2024-02-28 DIAGNOSIS — I10 Essential (primary) hypertension: Secondary | ICD-10-CM | POA: Diagnosis not present

## 2024-02-28 NOTE — Progress Notes (Signed)
 Subjective:    Patient ID: Wanda Aguilar, female    DOB: 1966/07/18, 58 y.o.   MRN: [id number]  HPI  Discussed the use of AI scribe software for clinical note transcription with the patient, who gave verbal consent to proceed.  The patient presents with swelling in the feet and legs.  She experiences significant swelling in her feet and legs, which are normal in the morning but become swollen by the end of the day. Despite taking furosemide, initially at 40 mg and now 60mg  daily, she notes only slight improvement. Compression socks help, but the swelling remains significant.  She stopped taking gabapentin, which contributed to the swelling, but continues amlodipine, which can also cause swelling. Despite these adjustments, the swelling persists.  Her legs feel weak and 'like jelly' throughout the day, especially when rising from a chair. She does not engage in much physical activity and finds that elevating her feet is not beneficial.  No chest pain or significant shortness of breath, though she attributes some shortness of breath to her weight. She has a history of atrial fibrillation but reports no recent issues and follows up with her cardiologist annually.   Her initial weight on furosemide 20 mg was up to 201 pounds up from 187 pounds 2 months prior.  She is currently down to 198 pounds.      Review of Systems     Past Medical History:  Diagnosis Date   Atrial fibrillation (HCC)    Fibroids    Frequent headaches    Hypertension    Urine incontinence     Current Outpatient Medications  Medication Sig Dispense Refill   albuterol (VENTOLIN HFA) 108 (90 Base) MCG/ACT inhaler Inhale 2 puffs into the lungs every 6 (six) hours as needed for wheezing or shortness of breath. 8 g 0   amLODipine-olmesartan (AZOR) 10-40 MG tablet TAKE 1 TABLET BY MOUTH EVERY DAY 90 tablet 0   apixaban (ELIQUIS) 5 MG TABS tablet Take 1 tablet (5 mg total) by mouth 2 (two) times daily. 60 tablet 5    diclofenac (VOLTAREN) 50 MG EC tablet Take 50 mg by mouth as needed.     diltiazem (CARDIZEM) 30 MG tablet Take 1 tablet by mouth up to 3X a day as needed for a heart rate of 110 or greater. 30 tablet 5   diltiazem (TIADYLT ER) 360 MG 24 hr capsule TAKE 1 CAPSULE BY MOUTH EVERY DAY 30 capsule 0   furosemide (LASIX) 20 MG tablet Take 2 tablets (40 mg total) by mouth daily.     gabapentin (NEURONTIN) 100 MG capsule Take 100-200 mg by mouth at bedtime.     Insulin Pen Needle 31G X 5 MM MISC BD Pen Needles- brand specific Inject insulin via insulin pen 6 x daily 30 each 1   metFORMIN (GLUCOPHAGE) 500 MG tablet TAKE 1 TABLET BY MOUTH 2 TIMES DAILY WITH A MEAL. 180 tablet 0   omeprazole (PRILOSEC) 20 MG capsule Take 1 capsule (20 mg total) by mouth daily. 90 capsule 1   OZEMPIC, 0.25 OR 0.5 MG/DOSE, 2 MG/3ML SOPN INJECT 0.25MG  INTO THE SKIN ONE TIME PER WEEK 9 mL 0   rosuvastatin (CRESTOR) 10 MG tablet TAKE 1 TABLET BY MOUTH EVERY DAY 90 tablet 0   venlafaxine XR (EFFEXOR XR) 75 MG 24 hr capsule Take 1 capsule (75 mg total) by mouth daily with breakfast. 90 capsule 1   No current facility-administered medications for this visit.    No Known  Allergies  Family History  Problem Relation Age of Onset   Congestive Heart Failure Mother    Diabetes type I Father    Diabetes Sister    Diabetes Sister    Arthritis Maternal Grandmother    Alcohol abuse Maternal Grandfather    Diabetes Paternal Grandmother    Diabetes Paternal Grandfather    Diabetes Paternal Aunt    Diabetes Paternal Uncle     Social History   Socioeconomic History   Marital status: Married    Spouse name: Not on file   Number of children: Not on file   Years of education: Not on file   Highest education level: Not on file  Occupational History   Not on file  Tobacco Use   Smoking status: Former    Current packs/day: 0.00    Average packs/day: 0.3 packs/day for 11.9 years (3.6 ttl pk-yrs)    Types: Cigarettes    Start  date: 01/14/2009    Quit date: 12/21/2020    Years since quitting: 3.1   Smokeless tobacco: Never  Vaping Use   Vaping status: Never Used  Substance and Sexual Activity   Alcohol use: Yes    Comment: occasional   Drug use: No   Sexual activity: Yes  Other Topics Concern   Not on file  Social History Narrative   Not on file   Social Drivers of Health   Financial Resource Strain: Not on file  Food Insecurity: Not on file  Transportation Needs: Not on file  Physical Activity: Not on file  Stress: Not on file  Social Connections: Not on file  Intimate Partner Violence: Not on file     Constitutional: Patient reports intermittent headaches, weight gain.  Denies fever, malaise, fatigue.  HEENT: Denies eye pain, eye redness, ear pain, ringing in the ears, wax buildup, runny nose, nasal congestion, bloody nose, or sore throat. Respiratory: Pt reports intermittent shortness of breath. Denies difficulty breathing, cough or sputum production.   Cardiovascular: Patient reports swelling in legs.  Denies chest pain, chest tightness, palpitations or swelling in the hands.  Neurological: Patient reports insomnia, lightheadedness.  Denies difficulty with memory, difficulty with speech or problems with balance and coordination.   No other specific complaints in a complete review of systems (except as listed in HPI above).  Objective:   Physical Exam  BP 138/78   Ht 5\' 3"  (1.6 m)   Wt 198 lb 12.8 oz (90.2 kg)   LMP 03/22/2014   BMI 35.22 kg/m    Wt Readings from Last 3 Encounters:  02/11/24 201 lb (91.2 kg)  11/28/23 187 lb 3.2 oz (84.9 kg)  09/19/23 193 lb (87.5 kg)    General: Appears her stated age, obese, in NAD. Cardiovascular: Normal rate and rhythm. S1,S2 noted.  No murmur, rubs or gallops noted. 1+ pitting BLE edema. No carotid bruits noted. Pulmonary/Chest: Normal effort and positive vesicular breath sounds. No respiratory distress. No wheezes, rales or ronchi noted.   Musculoskeletal: No difficulty with gait.  Neurological: Alert and oriented.   BMET    Component Value Date/Time   NA 140 09/19/2023 0927   NA 141 02/09/2021 0917   K 4.3 09/19/2023 0927   CL 101 09/19/2023 0927   CO2 28 09/19/2023 0927   GLUCOSE 173 (H) 09/19/2023 0927   BUN 15 09/19/2023 0927   BUN 15 02/09/2021 0917   CREATININE 0.74 09/19/2023 0927   CALCIUM 9.6 09/19/2023 0927   GFRNONAA >60 02/20/2023 0857   GFRAA  118 02/09/2021 0917    Lipid Panel     Component Value Date/Time   CHOL 227 (H) 09/19/2023 0927   TRIG 154 (H) 09/19/2023 0927   HDL 59 09/19/2023 0927   CHOLHDL 3.8 09/19/2023 0927   VLDL 57.6 (H) 06/22/2015 0746   LDLCALC 139 (H) 09/19/2023 0927    CBC    Component Value Date/Time   WBC 6.4 09/19/2023 0927   RBC 4.43 09/19/2023 0927   HGB 14.0 09/19/2023 0927   HGB 14.7 02/09/2021 0917   HCT 41.7 09/19/2023 0927   HCT 42.0 02/09/2021 0917   PLT 287 09/19/2023 0927   PLT 275 02/09/2021 0917   MCV 94.1 09/19/2023 0927   MCV 92 02/09/2021 0917   MCH 31.6 09/19/2023 0927   MCHC 33.6 09/19/2023 0927   RDW 12.7 09/19/2023 0927   RDW 11.9 02/09/2021 0917   LYMPHSABS 2.1 01/22/2021 0339   MONOABS 0.6 01/22/2021 0339   EOSABS 0.1 01/22/2021 0339   BASOSABS 0.1 01/22/2021 0339    Hgb A1C Lab Results  Component Value Date   HGBA1C 6.9 (H) 09/19/2023           Assessment & Plan:   Assessment and Plan    Peripheral Edema Persistent foot swelling with limited response to furosemide. Possible medication-related etiology. Differential includes chronic venous insufficiency. Heart failure unlikely. Kidney function assessment necessary due to increased furosemide dosage. - Order metabolic panel to assess kidney function. - Increase furosemide to 80 mg daily if kidney function normal. - Refer to vascular specialist for chronic venous insufficiency evaluation. -Reinforced DASH diet and exercise for weight loss - Advise to avoid compression  socks on vascular appointment day. - Contact on Monday with metabolic panel results and furosemide instructions.    RTC in 1 months, follow-up chronic conditions Nicki Reaper, NP

## 2024-02-28 NOTE — Patient Instructions (Signed)
 Edema  Edema is when you have too much fluid in your body or under your skin. Edema may make your legs, feet, and ankles swell. Swelling often happens in looser tissues, such as around your eyes. This is a common condition. It gets more common as you get older. There are many possible causes of edema. These include: Eating too much salt (sodium). Being on your feet or sitting for a long time. Certain medical conditions, such as: Pregnancy. Heart failure. Liver disease. Kidney disease. Cancer. Hot weather may make edema worse. Edema is usually painless. Your skin may look swollen or shiny. Follow these instructions at home: Medicines Take over-the-counter and prescription medicines only as told by your doctor. Your doctor may prescribe a medicine to help your body get rid of extra water (diuretic). Take this medicine if you are told to take it. Eating and drinking Eat a low-salt (low-sodium) diet as told by your doctor. Sometimes, eating less salt may reduce swelling. Depending on the cause of your swelling, you may need to limit how much fluid you drink (fluid restriction). General instructions Raise the injured area above the level of your heart while you are sitting or lying down. Do not sit still or stand for a long time. Do not wear tight clothes. Do not wear garters on your upper legs. Exercise your legs. This can help the swelling go down. Wear compression stockings as told by your doctor. It is important that these are the right size. These should be prescribed by your doctor to prevent possible injuries. If elastic bandages or wraps are recommended, use them as told by your doctor. Contact a doctor if: Treatment is not working. You have heart, liver, or kidney disease and have symptoms of edema. You have sudden and unexplained weight gain. Get help right away if: You have shortness of breath or chest pain. You cannot breathe when you lie down. You have pain, redness, or  warmth in the swollen areas. You have heart, liver, or kidney disease and get edema all of a sudden. You have a fever and your symptoms get worse all of a sudden. These symptoms may be an emergency. Get help right away. Call 911. Do not wait to see if the symptoms will go away. Do not drive yourself to the hospital. Summary Edema is when you have too much fluid in your body or under your skin. Edema may make your legs, feet, and ankles swell. Swelling often happens in looser tissues, such as around your eyes. Raise the injured area above the level of your heart while you are sitting or lying down. Follow your doctor's instructions about diet and how much fluid you can drink. This information is not intended to replace advice given to you by your health care provider. Make sure you discuss any questions you have with your health care provider. Document Revised: 08/14/2021 Document Reviewed: 08/14/2021 Elsevier Patient Education  2024 [employer].

## 2024-02-29 LAB — BASIC METABOLIC PANEL WITH GFR
BUN: 14 mg/dL (ref 7–25)
CO2: 28 mmol/L (ref 20–32)
Calcium: 10 mg/dL (ref 8.6–10.4)
Chloride: 98 mmol/L (ref 98–110)
Creat: 0.68 mg/dL (ref 0.50–1.03)
Glucose, Bld: 181 mg/dL — ABNORMAL HIGH (ref 65–139)
Potassium: 3.7 mmol/L (ref 3.5–5.3)
Sodium: 138 mmol/L (ref 135–146)
eGFR: 102 mL/min/{1.73_m2} (ref >=60)

## 2024-03-02 ENCOUNTER — Encounter: Payer: Self-pay | Admitting: Internal Medicine

## 2024-03-02 MED ORDER — FUROSEMIDE 40 MG PO TABS: 40.0000 mg | ORAL_TABLET | Freq: Two times a day (BID) | ORAL | 0 refills | Status: DC

## 2024-03-02 NOTE — Addendum Note (Signed)
 Addended by: Lorre Munroe on: 03/02/2024 08:28 AM   Modules accepted: Orders

## 2024-03-05 ENCOUNTER — Telehealth: Payer: Self-pay

## 2024-03-05 NOTE — Telephone Encounter (Signed)
 Wanda Aguilar (Key: [id number]) Rx #: [id number] Ozempic (0.25 or 0.5 MG/DOSE) 2MG Ronny Bacon pen-injectors Form [employer] Form [phone] NCPDP)

## 2024-03-06 ENCOUNTER — Telehealth: Payer: Self-pay

## 2024-03-06 NOTE — Telephone Encounter (Signed)
 Approved from 03/05/24-03/05/25

## 2024-03-06 NOTE — Telephone Encounter (Signed)
 Copied from CRM [id number]. Topic: Referral - Status >> Mar 06, 2024  1:38 PM [name] D wrote: Patient states no one has reached out to her about the referral  Please contact

## 2024-03-24 ENCOUNTER — Other Ambulatory Visit: Payer: Self-pay | Admitting: Cardiology

## 2024-03-26 ENCOUNTER — Ambulatory Visit: Payer: Self-pay | Admitting: Internal Medicine

## 2024-04-07 ENCOUNTER — Other Ambulatory Visit: Payer: Self-pay | Admitting: Cardiology

## 2024-04-17 ENCOUNTER — Ambulatory Visit (INDEPENDENT_AMBULATORY_CARE_PROVIDER_SITE_OTHER): Admitting: Nurse Practitioner

## 2024-04-17 ENCOUNTER — Encounter (INDEPENDENT_AMBULATORY_CARE_PROVIDER_SITE_OTHER): Payer: Self-pay | Admitting: Nurse Practitioner

## 2024-04-17 VITALS — BP 119/78 | HR 62 | Resp 18 | Ht 63.0 in | Wt 196.0 lb

## 2024-04-17 DIAGNOSIS — E119 Type 2 diabetes mellitus without complications: Secondary | ICD-10-CM

## 2024-04-17 DIAGNOSIS — M7989 Other specified soft tissue disorders: Secondary | ICD-10-CM | POA: Diagnosis not present

## 2024-04-17 DIAGNOSIS — I739 Peripheral vascular disease, unspecified: Secondary | ICD-10-CM | POA: Diagnosis not present

## 2024-04-17 DIAGNOSIS — I10 Essential (primary) hypertension: Secondary | ICD-10-CM

## 2024-04-19 ENCOUNTER — Encounter (INDEPENDENT_AMBULATORY_CARE_PROVIDER_SITE_OTHER): Payer: Self-pay | Admitting: Nurse Practitioner

## 2024-04-19 NOTE — Progress Notes (Signed)
 Subjective:    Patient ID: Wanda Aguilar, female    DOB: [birth year], 58 y.o.   MRN: [id number] Chief Complaint  Patient presents with   New Patient (Initial Visit)    np. consult. peripheral edema. baity, regina.    The patient is a 58 year old female who presents today for evaluation of bilateral lower extremity edema.  She notes that this has been going on since approximately the end of last year.  She has been wearing medical grade compression stockings and has helped her swelling but it has not completely eliminated.  She has also utilized Lasix  but this also does not completely eliminate her lower extremity edema.  She was at the swelling is better after she wakes up in the morning and then gradually worsens as the day progresses.  She does have a history of atrial fibrillation as well.  Currently there are no open wounds or ulcerations.  She currently does not have any weeping.  She also notes that her legs have been tired and heavy and achy as well.  She has that she is only able to walk for short distances before it feels that she needs to sit down and rest due to her legs feeling weak, heavy and tired.    Review of Systems  Cardiovascular:  Positive for leg swelling.  Neurological:  Positive for weakness.  All other systems reviewed and are negative.      Objective:   Physical Exam Vitals reviewed.  HENT:     Head: Normocephalic.  Cardiovascular:     Rate and Rhythm: Normal rate.  Pulmonary:     Effort: Pulmonary effort is normal.  Musculoskeletal:     Right lower leg: Edema present.     Left lower leg: Edema present.  Skin:    General: Skin is warm and dry.  Neurological:     Mental Status: She is alert and oriented to person, place, and time.  Psychiatric:        Mood and Affect: Mood normal.        Behavior: Behavior normal.        Thought Content: Thought content normal.        Judgment: Judgment normal.     BP 119/78   Pulse 62   Resp 18   Ht 5\' 3"  (1.6  m)   Wt 196 lb (88.9 kg)   LMP 03/22/2014   BMI 34.72 kg/m   Past Medical History:  Diagnosis Date   Atrial fibrillation (HCC)    Fibroids    Frequent headaches    Hypertension    Urine incontinence     Social History   Socioeconomic History   Marital status: Married    Spouse name: Not on file   Number of children: Not on file   Years of education: Not on file   Highest education level: Not on file  Occupational History   Not on file  Tobacco Use   Smoking status: Former    Current packs/day: 0.00    Average packs/day: 0.3 packs/day for 11.9 years (3.6 ttl pk-yrs)    Types: Cigarettes    Start date: 01/14/2009    Quit date: 12/21/2020    Years since quitting: 3.3   Smokeless tobacco: Never  Vaping Use   Vaping status: Never Used  Substance and Sexual Activity   Alcohol use: Yes    Comment: occasional   Drug use: No   Sexual activity: Yes  Other Topics Concern   Not  on file  Social History Narrative   Not on file   Social Drivers of Health   Financial Resource Strain: Not on file  Food Insecurity: Not on file  Transportation Needs: Not on file  Physical Activity: Not on file  Stress: Not on file  Social Connections: Not on file  Intimate Partner Violence: Not on file    Past Surgical History:  Procedure Laterality Date   ABDOMINAL HYSTERECTOMY Bilateral 04/13/2014   Procedure: HYSTERECTOMY ABDOMINAL WITH BILATERAL SALPINGO OOPHORECTOMY;  Surgeon: Chandler Combs, MD;  Location: [facility];  Service: Gynecology;  Laterality: Bilateral;   CARPAL TUNNEL RELEASE     Right Hand   Ceaserean     CESAREAN SECTION     GANGLION CYST EXCISION     Bilateral   TOOTH EXTRACTION      Family History  Problem Relation Age of Onset   Congestive Heart Failure Mother    Diabetes type I Father    Diabetes Sister    Diabetes Sister    Arthritis Maternal Grandmother    Alcohol abuse Maternal Grandfather    Diabetes Paternal Grandmother    Diabetes Paternal Grandfather     Diabetes Paternal Aunt    Diabetes Paternal Uncle     No Known Allergies     Latest Ref Rng & Units 09/19/2023    9:27 AM 02/20/2023    8:57 AM 06/25/2022   10:01 AM  CBC  WBC 3.8 - 10.8 Thousand/uL 6.4  6.8  7.8   Hemoglobin 11.7 - 15.5 g/dL 62.1  30.8  65.7   Hematocrit 35.0 - 45.0 % 41.7  41.2  43.8   Platelets 140 - 400 Thousand/uL 287  255  273       CMP     Component Value Date/Time   NA 138 02/28/2024 1455   NA 141 02/09/2021 0917   K 3.7 02/28/2024 1455   CL 98 02/28/2024 1455   CO2 28 02/28/2024 1455   GLUCOSE 181 (H) 02/28/2024 1455   BUN 14 02/28/2024 1455   BUN 15 02/09/2021 0917   CREATININE 0.68 02/28/2024 1455   CALCIUM  10.0 02/28/2024 1455   PROT 7.0 09/19/2023 0927   PROT 7.0 09/12/2018 1609   ALBUMIN 3.9 01/22/2021 0339   ALBUMIN 4.8 09/12/2018 1609   AST 15 09/19/2023 0927   ALT 25 09/19/2023 0927   ALKPHOS 66 01/22/2021 0339   BILITOT 0.5 09/19/2023 0927   BILITOT 0.2 09/12/2018 1609   GFR 74.54 06/22/2015 0746   EGFR 102 02/28/2024 1455   GFRNONAA >60 02/20/2023 0857     No results found.     Assessment & Plan:   1. Leg swelling (Primary) Recommend:  I have had a long discussion with the patient regarding swelling and why it  causes symptoms.  Patient will continue wearing graduated compression on a daily basis. The patient will  wear the stockings first thing in the morning and removing them in the evening. The patient is instructed specifically not to sleep in the stockings.   In addition, behavioral modification will be initiated.  This will include frequent elevation, use of over the counter pain medications and exercise such as walking.  Consideration for a lymph pump will also be made based upon the effectiveness of conservative therapy.  This would help to improve the edema control and prevent sequela such as ulcers and infections   Patient should undergo duplex ultrasound of the venous system to ensure that DVT or reflux is not  present.  The patient will follow-up with me after the ultrasound.   2. Claudication [facility]) The patient does have some symptoms that are suspicious for claudication.  We will have her undergo ABIs as well.  3. Essential hypertension Continue antihypertensive medications as already ordered, these medications have been reviewed and there are no changes at this time.  4. Type 2 diabetes mellitus without complication, without long-term current use of insulin  (HCC) Continue hypoglycemic medications as already ordered, these medications have been reviewed and there are no changes at this time.  Hgb A1C to be monitored as already arranged by primary service   Current Outpatient Medications on File Prior to Visit  Medication Sig Dispense Refill   amLODipine -olmesartan  (AZOR ) 10-40 MG tablet TAKE 1 TABLET BY MOUTH EVERY DAY 90 tablet 0   apixaban  (ELIQUIS ) 5 MG TABS tablet Take 1 tablet (5 mg total) by mouth 2 (two) times daily. 60 tablet 5   diltiazem  (TIADYLT  ER) 360 MG 24 hr capsule TAKE 1 CAPSULE BY MOUTH EVERY DAY 15 capsule 0   furosemide  (LASIX ) 40 MG tablet Take 1 tablet (40 mg total) by mouth 2 (two) times daily. 180 tablet 0   metFORMIN  (GLUCOPHAGE ) 500 MG tablet TAKE 1 TABLET BY MOUTH 2 TIMES DAILY WITH A MEAL. 180 tablet 0   OZEMPIC , 0.25 OR 0.5 MG/DOSE, 2 MG/3ML SOPN INJECT 0.25MG  INTO THE SKIN ONE TIME PER WEEK 9 mL 0   albuterol  (VENTOLIN  HFA) 108 (90 Base) MCG/ACT inhaler Inhale 2 puffs into the lungs every 6 (six) hours as needed for wheezing or shortness of breath. (Patient not taking: Reported on 04/17/2024) 8 g 0   diclofenac  (VOLTAREN ) 50 MG EC tablet Take 50 mg by mouth as needed. (Patient not taking: Reported on 04/17/2024)     diltiazem  (CARDIZEM ) 30 MG tablet Take 1 tablet by mouth up to 3X a day as needed for a heart rate of 110 or greater. (Patient not taking: Reported on 04/17/2024) 30 tablet 5   gabapentin (NEURONTIN) 100 MG capsule Take 100-200 mg by mouth at bedtime.  (Patient not taking: Reported on 04/17/2024)     Insulin  Pen Needle 31G X 5 MM MISC BD Pen Needles- brand specific Inject insulin  via insulin  pen 6 x daily (Patient not taking: Reported on 04/17/2024) 30 each 1   omeprazole  (PRILOSEC) 20 MG capsule Take 1 capsule (20 mg total) by mouth daily. (Patient not taking: Reported on 04/17/2024) 90 capsule 1   rosuvastatin  (CRESTOR ) 10 MG tablet TAKE 1 TABLET BY MOUTH EVERY DAY (Patient not taking: Reported on 04/17/2024) 90 tablet 0   venlafaxine  XR (EFFEXOR  XR) 75 MG 24 hr capsule Take 1 capsule (75 mg total) by mouth daily with breakfast. (Patient not taking: Reported on 04/17/2024) 90 capsule 1   No current facility-administered medications on file prior to visit.    There are no Patient Instructions on file for this visit. No follow-ups on file.   Jacque Garrels E Jacqui Headen, NP

## 2024-05-01 LAB — HM DIABETES EYE EXAM

## 2024-05-02 ENCOUNTER — Other Ambulatory Visit: Payer: Self-pay | Admitting: Cardiology

## 2024-05-02 ENCOUNTER — Other Ambulatory Visit: Payer: Self-pay | Admitting: Internal Medicine

## 2024-05-02 DIAGNOSIS — Z7901 Long term (current) use of anticoagulants: Secondary | ICD-10-CM

## 2024-05-02 DIAGNOSIS — I480 Paroxysmal atrial fibrillation: Secondary | ICD-10-CM

## 2024-05-04 NOTE — Telephone Encounter (Signed)
 Prescription refill request for Eliquis  received. Indication:afib Last office visit:needs appt Scr:n/a Age: 58 Weight:88.9  kg  Prescription refilled

## 2024-05-05 NOTE — Telephone Encounter (Signed)
 Unable to refill per protocol, Rx discontinued 02/11/24, dose change.  Requested Prescriptions  Pending Prescriptions Disp Refills   furosemide  (LASIX ) 20 MG tablet [Pharmacy Med Name: FUROSEMIDE  20 MG TABLET] 90 tablet 0    Sig: TAKE 1 TABLET BY MOUTH EVERY DAY     Cardiovascular:  Diuretics - Loop Failed - 05/05/2024  8:28 AM      Failed - Mg Level in normal range and within 180 days    Magnesium   Date Value Ref Range Status  05/24/2021 2.0 1.7 - 2.4 mg/dL Final    Comment:    Performed at [facility], [address] [place], [place] [address]         Passed - K in normal range and within 180 days    Potassium  Date Value Ref Range Status  02/28/2024 3.7 3.5 - 5.3 mmol/L Final         Passed - Ca in normal range and within 180 days    Calcium   Date Value Ref Range Status  02/28/2024 10.0 8.6 - 10.4 mg/dL Final         Passed - Na in normal range and within 180 days    Sodium  Date Value Ref Range Status  02/28/2024 138 135 - 146 mmol/L Final  02/09/2021 141 134 - 144 mmol/L Final         Passed - Cr in normal range and within 180 days    Creat  Date Value Ref Range Status  02/28/2024 0.68 0.50 - 1.03 mg/dL Final   Creatinine, Urine  Date Value Ref Range Status  09/19/2023 81 20 - 275 mg/dL Final         Passed - Cl in normal range and within 180 days    Chloride  Date Value Ref Range Status  02/28/2024 98 98 - 110 mmol/L Final         Passed - Last BP in normal range    BP Readings from Last 1 Encounters:  04/17/24 119/78         Passed - Valid encounter within last 6 months    Recent Outpatient Visits           2 months ago Peripheral edema   [facility] [place], Rankin Buzzard, NP   2 months ago Peripheral edema   [facility] [place], Rankin Buzzard, [place]

## 2024-05-06 ENCOUNTER — Telehealth: Payer: Self-pay | Admitting: *Deleted

## 2024-05-06 NOTE — Telephone Encounter (Signed)
   Pre-operative Risk Assessment    Patient Name: Wanda Aguilar  DOB: October 22, 1966 MRN: [id number]   Date of last office visit: 02/20/23 DR. Marven Slimmer Date of next office visit: NONE   Request for Surgical Clearance    Procedure:  COLONOSCOPY  Date of Surgery:  Clearance 06/22/24                                Surgeon:  DR. [facility] Surgeon's Group or Practice Name:  [facility] Phone number:  [phone] Fax number:  [phone]   Type of Clearance Requested:   - Medical  - Pharmacy:  Hold Apixaban  (Eliquis )     Type of Anesthesia:  PROPOFOL    Additional requests/questions:    [name]   05/06/2024, 5:44 PM

## 2024-05-07 ENCOUNTER — Other Ambulatory Visit: Payer: Self-pay | Admitting: Internal Medicine

## 2024-05-07 NOTE — Telephone Encounter (Signed)
 I s/w the pt and she has been scheduled to see Odessa Bene, NP 05/15/24 for preop clearance. I will update all parties involved.

## 2024-05-07 NOTE — Telephone Encounter (Signed)
   Name: Wanda Aguilar  DOB: 1966/09/09  MRN: [id number]  Primary Cardiologist: Constancia Delton, MD  Chart reviewed as part of pre-operative protocol coverage. Because of Hadlei Ramaley Mia's past medical history and time since last visit, she will require a follow-up in-office visit in order to better assess preoperative cardiovascular risk.  Pre-op covering staff: - Please schedule appointment and call patient to inform them. If patient already had an upcoming appointment within acceptable timeframe, please add "pre-op clearance" to the appointment notes so provider is aware. - Please contact requesting surgeon's office via preferred method (i.e, phone, fax) to inform them of need for appointment prior to surgery.  This message will also be routed to pharmacy pool for input on holding Eliquis  as requested below so that this information is available to the clearing provider at time of patient's appointment.   Ava Boatman, NP  05/07/2024, 9:38 AM

## 2024-05-07 NOTE — Telephone Encounter (Signed)
 Patient with diagnosis of A Fib on Eliquis  for anticoagulation. Last office visit 02/20/23.  Procedure: colonoscopy Date of procedure: 06/22/24   CHA2DS2-VASc Score = 3  This indicates a 3.2% annual risk of stroke. The patient's score is based upon: CHF History: 0 HTN History: 1 Diabetes History: 1 Stroke History: 0 Vascular Disease History: 0 Age Score: 0 Gender Score: 1   CrCl 118 ml/min using adj body weight Platelet count 287K  Per office protocol, patient can hold Eliquis  for 2 days prior to procedure.     **This guidance is not considered finalized until pre-operative APP has relayed final recommendations.**

## 2024-05-08 ENCOUNTER — Other Ambulatory Visit (INDEPENDENT_AMBULATORY_CARE_PROVIDER_SITE_OTHER): Payer: Self-pay | Admitting: Nurse Practitioner

## 2024-05-08 DIAGNOSIS — I739 Peripheral vascular disease, unspecified: Secondary | ICD-10-CM

## 2024-05-08 DIAGNOSIS — M7989 Other specified soft tissue disorders: Secondary | ICD-10-CM

## 2024-05-08 NOTE — Telephone Encounter (Signed)
 Requested Prescriptions  Pending Prescriptions Disp Refills   amLODipine -olmesartan  (AZOR ) 10-40 MG tablet [Pharmacy Med Name: AMLODIPINE -OLMESARTAN  10-40 MG] 90 tablet 0    Sig: TAKE 1 TABLET BY MOUTH EVERY DAY     Cardiovascular: CCB + ARB Combos Passed - 05/08/2024 11:28 AM      Passed - K in normal range and within 180 days    Potassium  Date Value Ref Range Status  02/28/2024 3.7 3.5 - 5.3 mmol/L Final         Passed - Cr in normal range and within 180 days    Creat  Date Value Ref Range Status  02/28/2024 0.68 0.50 - 1.03 mg/dL Final   Creatinine, Urine  Date Value Ref Range Status  09/19/2023 81 20 - 275 mg/dL Final         Passed - Na in normal range and within 180 days    Sodium  Date Value Ref Range Status  02/28/2024 138 135 - 146 mmol/L Final  02/09/2021 141 134 - 144 mmol/L Final         Passed - Patient is not pregnant      Passed - Last BP in normal range    BP Readings from Last 1 Encounters:  04/17/24 119/78         Passed - Valid encounter within last 6 months    Recent Outpatient Visits           2 months ago Peripheral edema   [facility] [place], Rankin Buzzard, NP   2 months ago Peripheral edema   [place] [facility] [place], Rankin Buzzard, NP       Future Appointments             In 1 week Marvel Slicker, Eugene Hertz, NP [facility]

## 2024-05-11 ENCOUNTER — Ambulatory Visit (INDEPENDENT_AMBULATORY_CARE_PROVIDER_SITE_OTHER): Admitting: Nurse Practitioner

## 2024-05-11 ENCOUNTER — Ambulatory Visit (INDEPENDENT_AMBULATORY_CARE_PROVIDER_SITE_OTHER)

## 2024-05-11 ENCOUNTER — Encounter (INDEPENDENT_AMBULATORY_CARE_PROVIDER_SITE_OTHER): Payer: Self-pay | Admitting: Nurse Practitioner

## 2024-05-11 VITALS — BP 111/72 | HR 66 | Resp 18 | Ht 63.0 in | Wt 193.8 lb

## 2024-05-11 DIAGNOSIS — M7989 Other specified soft tissue disorders: Secondary | ICD-10-CM | POA: Diagnosis not present

## 2024-05-11 DIAGNOSIS — I10 Essential (primary) hypertension: Secondary | ICD-10-CM | POA: Diagnosis not present

## 2024-05-11 DIAGNOSIS — I739 Peripheral vascular disease, unspecified: Secondary | ICD-10-CM

## 2024-05-11 DIAGNOSIS — I890 Lymphedema, not elsewhere classified: Secondary | ICD-10-CM | POA: Diagnosis not present

## 2024-05-11 LAB — VAS US ABI WITH/WO TBI
Left ABI: 1.31
Right ABI: 1.23

## 2024-05-12 ENCOUNTER — Encounter (INDEPENDENT_AMBULATORY_CARE_PROVIDER_SITE_OTHER): Payer: Self-pay

## 2024-05-12 ENCOUNTER — Encounter (INDEPENDENT_AMBULATORY_CARE_PROVIDER_SITE_OTHER): Payer: Self-pay | Admitting: Nurse Practitioner

## 2024-05-12 NOTE — Progress Notes (Signed)
 Subjective:    Patient ID: Wanda Aguilar, female    DOB: Dec 11, 1966, 58 y.o.   MRN: [id number] Chief Complaint  Patient presents with   Follow-up    Pt conv abi and bilateral reflux    The patient is a 58 year old female who presents today for evaluation of bilateral lower extremity edema.  She notes that this has been going on since approximately the end of last year.  She has been wearing medical grade compression stockings and has helped her swelling but it has not completely eliminated.  She has also utilized Lasix  but this also does not completely eliminate her lower extremity edema.  She was at the swelling is better after she wakes up in the morning and then gradually worsens as the day progresses.  She does have a history of atrial fibrillation as well.  Currently there are no open wounds or ulcerations.  She currently does not have any weeping.  Today she notes that her swelling is improved because she has not worked a full day today.  She also notes that her legs have been tired and heavy and achy as well.  She has that she is only able to walk for short distances before it feels that she needs to sit down and rest due to her legs feeling weak, heavy and tired.    Today noninvasive studies show no evidence of DVT or superficial thrombophlebitis bilaterally.  No evidence of deep venous insufficiency or superficial venous reflux noted bilaterally.  The patient has an ABI of 1.23 on the right and 1.31 on the left.  She has normal TBI's bilaterally with strong triphasic tibial waveforms in the bilateral lower extremities with good toe waveforms.     Review of Systems  All other systems reviewed and are negative.      Objective:    Physical Exam Vitals reviewed.  HENT:     Head: Normocephalic.  Cardiovascular:     Rate and Rhythm: Normal rate.     Pulses: Normal pulses.  Pulmonary:     Effort: Pulmonary effort is normal.  Musculoskeletal:     Right lower leg: Edema  present.     Left lower leg: Edema present.  Skin:    General: Skin is warm and dry.  Neurological:     Mental Status: She is alert and oriented to person, place, and time.  Psychiatric:        Mood and Affect: Mood normal.        Behavior: Behavior normal.        Thought Content: Thought content normal.        Judgment: Judgment normal.     BP 111/72   Pulse 66   Resp 18   Ht 5\' 3"  (1.6 m)   Wt 193 lb 12.8 oz (87.9 kg)   LMP 03/22/2014   BMI 34.33 kg/m   Past Medical History:  Diagnosis Date   Atrial fibrillation (HCC)    Fibroids    Frequent headaches    Hypertension    Urine incontinence     Social History   Socioeconomic History   Marital status: Married    Spouse name: Not on file   Number of children: Not on file   Years of education: Not on file   Highest education level: Not on file  Occupational History   Not on file  Tobacco Use   Smoking status: Former    Current packs/day: 0.00    Average packs/day: 0.3  packs/day for 11.9 years (3.6 ttl pk-yrs)    Types: Cigarettes    Start date: 01/14/2009    Quit date: 12/21/2020    Years since quitting: 3.3   Smokeless tobacco: Never  Vaping Use   Vaping status: Never Used  Substance and Sexual Activity   Alcohol use: Yes    Comment: occasional   Drug use: No   Sexual activity: Yes  Other Topics Concern   Not on file  Social History Narrative   Not on file   Social Drivers of Health   Financial Resource Strain: Not on file  Food Insecurity: Not on file  Transportation Needs: Not on file  Physical Activity: Not on file  Stress: Not on file  Social Connections: Not on file  Intimate Partner Violence: Not on file    Past Surgical History:  Procedure Laterality Date   ABDOMINAL HYSTERECTOMY Bilateral 04/13/2014   Procedure: HYSTERECTOMY ABDOMINAL WITH BILATERAL SALPINGO OOPHORECTOMY;  Surgeon: Chandler Combs, MD;  Location: [facility];  Service: Gynecology;  Laterality: Bilateral;   CARPAL TUNNEL  RELEASE     Right Hand   Ceaserean     CESAREAN SECTION     GANGLION CYST EXCISION     Bilateral   TOOTH EXTRACTION      Family History  Problem Relation Age of Onset   Congestive Heart Failure Mother    Diabetes type I Father    Diabetes Sister    Diabetes Sister    Arthritis Maternal Grandmother    Alcohol abuse Maternal Grandfather    Diabetes Paternal Grandmother    Diabetes Paternal Grandfather    Diabetes Paternal Aunt    Diabetes Paternal Uncle     No Known Allergies     Latest Ref Rng & Units 09/19/2023    9:27 AM 02/20/2023    8:57 AM 06/25/2022   10:01 AM  CBC  WBC 3.8 - 10.8 Thousand/uL 6.4  6.8  7.8   Hemoglobin 11.7 - 15.5 g/dL 30.8  65.7  84.6   Hematocrit 35.0 - 45.0 % 41.7  41.2  43.8   Platelets 140 - 400 Thousand/uL 287  255  273        CMP     Component Value Date/Time   NA 138 02/28/2024 1455   NA 141 02/09/2021 0917   K 3.7 02/28/2024 1455   CL 98 02/28/2024 1455   CO2 28 02/28/2024 1455   GLUCOSE 181 (H) 02/28/2024 1455   BUN 14 02/28/2024 1455   BUN 15 02/09/2021 0917   CREATININE 0.68 02/28/2024 1455   CALCIUM  10.0 02/28/2024 1455   PROT 7.0 09/19/2023 0927   PROT 7.0 09/12/2018 1609   ALBUMIN 3.9 01/22/2021 0339   ALBUMIN 4.8 09/12/2018 1609   AST 15 09/19/2023 0927   ALT 25 09/19/2023 0927   ALKPHOS 66 01/22/2021 0339   BILITOT 0.5 09/19/2023 0927   BILITOT 0.2 09/12/2018 1609   GFR 74.54 06/22/2015 0746   EGFR 102 02/28/2024 1455   GFRNONAA >60 02/20/2023 0857     VAS US  ABI WITH/WO TBI Result Date: 05/11/2024  LOWER EXTREMITY DOPPLER STUDY Patient Name:  Wanda Aguilar  Date of Exam:   05/11/2024 Medical Rec #: [id number]        Accession #:    [id number] Date of Birth: 12-Dec-1966         Patient Gender: F Patient Age:   89 years Exam Location:  [facility] Procedure:  VAS US  ABI WITH/WO TBI Referring Phys: Devon Fogo --------------------------------------------------------------------------------   Indications: Claudication.  Performing Technologist: Tonie Franks RVS  Examination Guidelines: A complete evaluation includes at minimum, Doppler waveform signals and systolic blood pressure reading at the level of bilateral brachial, anterior tibial, and posterior tibial arteries, when vessel segments are accessible. Bilateral testing is considered an integral part of a complete examination. Photoelectric Plethysmograph (PPG) waveforms and toe systolic pressure readings are included as required and additional duplex testing as needed. Limited examinations for reoccurring indications may be performed as noted.  ABI Findings: +---------+------------------+-----+---------+--------+ Right    Rt Pressure (mmHg)IndexWaveform Comment  +---------+------------------+-----+---------+--------+ Brachial 119                                      +---------+------------------+-----+---------+--------+ ATA      140               1.16 triphasic         +---------+------------------+-----+---------+--------+ PTA      149               1.23 triphasic         +---------+------------------+-----+---------+--------+ Great Toe116               0.96 Normal            +---------+------------------+-----+---------+--------+ +---------+------------------+-----+---------+-------+ Left     Lt Pressure (mmHg)IndexWaveform Comment +---------+------------------+-----+---------+-------+ Brachial 121                                     +---------+------------------+-----+---------+-------+ ATA      136               1.12 triphasic        +---------+------------------+-----+---------+-------+ PTA      158               1.31 triphasic        +---------+------------------+-----+---------+-------+ Great Toe128               1.06 Normal           +---------+------------------+-----+---------+-------+ +-------+-----------+-----------+------------+------------+ ABI/TBIToday's ABIToday's  TBIPrevious ABIPrevious TBI +-------+-----------+-----------+------------+------------+ Right  1.23       .96                                 +-------+-----------+-----------+------------+------------+ Left   1.31       1.06                                +-------+-----------+-----------+------------+------------+  Summary: Right: Resting right ankle-brachial index is within normal range. The right toe-brachial index is normal. Left: Resting left ankle-brachial index is within normal range. The left toe-brachial index is normal. *See table(s) above for measurements and observations.  Electronically signed by Devon Fogo MD on 05/11/2024 at 4:59:18 PM.    Final        Assessment & Plan:   1. Lymphedema (Primary) Today's noninvasive study showed no evidence of significant venous insufficiency.  Based on her description of symptoms I believe that she has lymphedema.  She also has an upcoming evaluation with her cardiologist regarding her atrial fibrillation.  There is certainly possible that some of her swelling may be attributed to a cardiac  cause but I believe that in this case it is likely largely lymphedema in this instance.  We have discussed conservative therapies including medical grade compression, elevation and activity.  She currently does not wear compression and so I have reiterated that she should be utilizing 20 to 30 mmHg.  Will plan on having her return in 3 months to evaluate her progress with conservative therapies and at that time we can discuss lymphedema pump if they have not been effective for her.  2. Claudication (HCC) Noninvasive studies today show normal arterial studies.  No further workup required at this time.  She continues to have claudication with symptoms, musculoskeletal possibilities should be considered by PCP  3. Essential hypertension Continue antihypertensive medications as already ordered, these medications have been reviewed and there are no  changes at this time.   Current Outpatient Medications on File Prior to Visit  Medication Sig Dispense Refill   amLODipine -olmesartan  (AZOR ) 10-40 MG tablet TAKE 1 TABLET BY MOUTH EVERY DAY 90 tablet 0   apixaban  (ELIQUIS ) 5 MG TABS tablet Take 1 tablet (5 mg total) by mouth 2 (two) times daily. NEEDS CARDIOLOGY APPT FOR ELIQUIS  REFILLS, CALL OFFICE 60 tablet 0   diltiazem  (TIADYLT  ER) 360 MG 24 hr capsule TAKE 1 CAPSULE BY MOUTH EVERY DAY 15 capsule 0   furosemide  (LASIX ) 40 MG tablet Take 1 tablet (40 mg total) by mouth 2 (two) times daily. 180 tablet 0   metFORMIN  (GLUCOPHAGE ) 500 MG tablet TAKE 1 TABLET BY MOUTH 2 TIMES DAILY WITH A MEAL. 180 tablet 0   OZEMPIC , 0.25 OR 0.5 MG/DOSE, 2 MG/3ML SOPN INJECT 0.25MG  INTO THE SKIN ONE TIME PER WEEK 9 mL 0   albuterol  (VENTOLIN  HFA) 108 (90 Base) MCG/ACT inhaler Inhale 2 puffs into the lungs every 6 (six) hours as needed for wheezing or shortness of breath. (Patient not taking: Reported on 05/11/2024) 8 g 0   diclofenac  (VOLTAREN ) 50 MG EC tablet Take 50 mg by mouth as needed. (Patient not taking: Reported on 04/17/2024)     diltiazem  (CARDIZEM ) 30 MG tablet Take 1 tablet by mouth up to 3X a day as needed for a heart rate of 110 or greater. (Patient not taking: Reported on 05/11/2024) 30 tablet 5   gabapentin (NEURONTIN) 100 MG capsule Take 100-200 mg by mouth at bedtime. (Patient not taking: Reported on 05/11/2024)     Insulin  Pen Needle 31G X 5 MM MISC BD Pen Needles- brand specific Inject insulin  via insulin  pen 6 x daily (Patient not taking: Reported on 05/11/2024) 30 each 1   omeprazole  (PRILOSEC) 20 MG capsule Take 1 capsule (20 mg total) by mouth daily. (Patient not taking: Reported on 05/11/2024) 90 capsule 1   rosuvastatin  (CRESTOR ) 10 MG tablet TAKE 1 TABLET BY MOUTH EVERY DAY (Patient not taking: Reported on 05/11/2024) 90 tablet 0   venlafaxine  XR (EFFEXOR  XR) 75 MG 24 hr capsule Take 1 capsule (75 mg total) by mouth daily with breakfast. (Patient  not taking: Reported on 05/11/2024) 90 capsule 1   No current facility-administered medications on file prior to visit.    There are no Patient Instructions on file for this visit. No follow-ups on file.   Benjamine Strout E Dvora Buitron, NP

## 2024-05-15 ENCOUNTER — Encounter: Payer: Self-pay | Admitting: Nurse Practitioner

## 2024-05-15 ENCOUNTER — Ambulatory Visit: Attending: Nurse Practitioner | Admitting: Physician Assistant

## 2024-05-15 VITALS — BP 136/76 | HR 86 | Resp 16 | Ht 63.0 in | Wt 196.0 lb

## 2024-05-15 DIAGNOSIS — Z01818 Encounter for other preprocedural examination: Secondary | ICD-10-CM

## 2024-05-15 DIAGNOSIS — M7989 Other specified soft tissue disorders: Secondary | ICD-10-CM

## 2024-05-15 DIAGNOSIS — I10 Essential (primary) hypertension: Secondary | ICD-10-CM | POA: Diagnosis not present

## 2024-05-15 DIAGNOSIS — I480 Paroxysmal atrial fibrillation: Secondary | ICD-10-CM

## 2024-05-15 DIAGNOSIS — Z01810 Encounter for preprocedural cardiovascular examination: Secondary | ICD-10-CM | POA: Diagnosis not present

## 2024-05-15 MED ORDER — DILTIAZEM HCL ER BEADS 360 MG PO CP24: 360.0000 mg | ORAL_CAPSULE | Freq: Every day | ORAL | 10 refills | Status: DC

## 2024-05-15 MED ORDER — DILTIAZEM HCL 30 MG PO TABS: ORAL_TABLET | ORAL | 10 refills | Status: AC

## 2024-05-15 NOTE — Progress Notes (Signed)
 Cardiology Office Note    Date:  05/15/2024   ID:  Wanda Aguilar, DOB 12/13/1966, MRN [id number]  PCP:  Carollynn Cirri, NP  Cardiologist:  Constancia Delton, MD  Electrophysiologist:  Boyce Byes, MD   Chief Complaint: Pre-operative cardiac risk assessment  History of Present Illness:   Wanda Aguilar is a 58 y.o. female with history of paroxysmal atrial fibrillation, hypertension, depression, and hypokalemia who presents for pre-operative cardiac risk assessment.     Originally seen at [facility] ED 12/2020 with chest pain and palpitations found to be in atrial fibrillation with RVR.  She was treated with Cardizem  and metoprolol .  Anticoagulation with Eliquis  initiated due to CHA2DS2-VASc 2 (gender, hypertension).  She was noted to be COVID-positive but no respiratory distress nor infiltrate on chest x-ray.  For management of blood pressure amlodipine  was discontinued, Cardizem  was added, losartan  reduced, and hydrochlorothiazide  continued.  She also required potassium supplementation.  Echo 01/2021 showed EF 60 to 65%, G1 DD, RV normal size and function, mild MR.  Ad[facility] 05/2021 with palpitations and chest discomfort.  She was found to be in atrial fibrillation.  She converted to sinus on IV diltiazem .  2 weeks ZIO on discharge, resulted 02/2021, did not show any recurrence of atrial fibrillation.  She was seen in follow-up 07/2021 with Dr. Marven Slimmer who felt that she was a good candidate for ablation.  She also reported snoring and patient was scheduled for sleep study.  She was again seen in follow-up by Dr. Marven Slimmer 01/2023.  She reported not following through with obtaining CPAP despite having a positive sleep study.  She also reported recently running out of Eliquis .  He recommended 2 weeks ZIO to assess burden.  Monitor did not show any recurrence of atrial fibrillation.  She was instructed to follow-up in 6 weeks but was lost to follow-up.    Patient presents today for  preoperative cardiac risk assessment prior to routine colonoscopy which is scheduled for 06/22/2024 with Dr. Feliberto Hopping. She would also like to discuss new lower extremity edema that is currently being managed by her PCP with Lasix  80 mg daily in addition to compression stockings. She was seen by vascular surgery 05/11/2024 who believe her symptoms are consistent with lymphedema and recommended conservative therapies with 3 month follow up and consideration for lymphedema pump.   Today, patient reports overall doing well from a cardiac perspective.  She is without symptoms of angina or cardiac decompensation.  She has not any symptoms of palpitations or chest discomfort concerning for atrial fibrillation.  She has been taking all of her medications as prescribed without adverse effect.  She denies bleeding and hematochezia.  She does endorse new lower extremity edema that she has been managed by vascular surgery and her PCP for the past several months.  She reports that the swelling progressively worsens throughout the day and its worst in the evenings.  She has been wearing compression stockings which has helped some.  She also notices that when she is more active throughout the day the swelling is improved.  She notes times at rest where she feels that she needs to take a deep breath or "catch her breath" which seems to be more related to stress/anxiety. She denies significant shortness of breath with exertion and her activity is not limited by dyspnea.  She is able to ambulate and climb stairs without chest pain and significant dyspnea.  She denies orthopnea and PND.  Labs independently reviewed:  02/28/2024-BUN 14, CR 0.68, NA 138, K 3.7 09/19/2023-A1c 6.9, TC 227, HDL 59, TG 154, LDL 139, Hgb 14, HCT 41.7  Past Medical History:  Diagnosis Date   Diastolic dysfunction    a. 01/2021 Echo: EF 60-65%, no rmwa, GrI DD, nl RV fxn, mild MR.   Fibroids    Frequent headaches    Hypertension    PAF (paroxysmal atrial  fibrillation) (HCC)    a. Dx 12/2020 in setting of COVID-19 infxn; b. CHA2DS2VASc= 2-->eliquis ; c. 03/2023 Zio: No afib/flutter. 15 runs of SVT, longest 17.1 sec @ 142 (assoc w/ triggered events).   PSVT (paroxysmal supraventricular tachycardia) (HCC)    a. 03/2023 Zio: 15 SVT runs up to 17.1 secs @ 142 bpm-->corresponded w/ triggered episodes.   Urine incontinence     Current Medications: Current Meds  Medication Sig   albuterol  (VENTOLIN  HFA) 108 (90 Base) MCG/ACT inhaler Inhale 2 puffs into the lungs every 6 (six) hours as needed for wheezing or shortness of breath.   amLODipine -olmesartan  (AZOR ) 10-40 MG tablet TAKE 1 TABLET BY MOUTH EVERY DAY   apixaban  (ELIQUIS ) 5 MG TABS tablet Take 1 tablet (5 mg total) by mouth 2 (two) times daily. NEEDS CARDIOLOGY APPT FOR ELIQUIS  REFILLS, CALL OFFICE   diclofenac  (VOLTAREN ) 50 MG EC tablet Take 50 mg by mouth as needed.   furosemide  (LASIX ) 40 MG tablet Take 1 tablet (40 mg total) by mouth 2 (two) times daily.   gabapentin (NEURONTIN) 100 MG capsule Take 100-200 mg by mouth at bedtime.   metFORMIN  (GLUCOPHAGE ) 500 MG tablet TAKE 1 TABLET BY MOUTH 2 TIMES DAILY WITH A MEAL.   omeprazole  (PRILOSEC) 20 MG capsule Take 1 capsule (20 mg total) by mouth daily.   OZEMPIC , 0.25 OR 0.5 MG/DOSE, 2 MG/3ML SOPN INJECT 0.25MG  INTO THE SKIN ONE TIME PER WEEK   [DISCONTINUED] diltiazem  (CARDIZEM ) 30 MG tablet Take 1 tablet by mouth up to 3X a day as needed for a heart rate of 110 or greater.   [DISCONTINUED] diltiazem  (TIADYLT  ER) 360 MG 24 hr capsule TAKE 1 CAPSULE BY MOUTH EVERY DAY    Allergies:   Patient has no known allergies.   Social History   Socioeconomic History   Marital status: Married    Spouse name: Not on file   Number of children: 2   Years of education: Not on file   Highest education level: Not on file  Occupational History   Not on file  Tobacco Use   Smoking status: Former    Current packs/day: 0.00    Average packs/day: 0.3  packs/day for 11.9 years (3.6 ttl pk-yrs)    Types: Cigarettes    Start date: 01/14/2009    Quit date: 12/21/2020    Years since quitting: 3.4   Smokeless tobacco: Never  Vaping Use   Vaping status: Never Used  Substance and Sexual Activity   Alcohol use: Yes    Comment: occasional   Drug use: No   Sexual activity: Yes  Other Topics Concern   Not on file  Social History Narrative   Not on file   Social Drivers of Health   Financial Resource Strain: Not on file  Food Insecurity: Not on file  Transportation Needs: Not on file  Physical Activity: Not on file  Stress: Not on file  Social Connections: Not on file     Family History:  The patient's family history includes Alcohol abuse in her maternal grandfather; Arthritis in her maternal grandmother; Congestive Heart  Failure in her mother; Diabetes in her paternal aunt, paternal grandfather, paternal grandmother, paternal uncle, sister, and sister; Diabetes type I in her father.  ROS:   12-point review of systems is negative unless otherwise noted in the HPI.   EKGs/Labs/Other Studies Reviewed:    Studies reviewed were summarized above. The additional studies were reviewed today:  04/08/2023 Zio Monitor HR 42 - 184 bpm, average 72 bpm. 15 nonsustained SVT, longest 17.1 seconds with an average rate of 142 bpm. Rare supraventricular and ventricular ectopy. No atrial fibrillation. Symptom trigger episodes correspond to SVT.  03/03/2021 Zio monitor Patient had a min HR of 40 bpm, max HR of 255 bpm, and avg HR of 76 bpm. Predominant underlying rhythm was Sinus Rhythm. 1 run of Ventricular Tachycardia occurred lasting 4 beats with a max rate of 255 bpm (avg 202 bpm). 7 Supraventricular Tachycardia  runs occurred, the run with the fastest interval lasting 4 beats with a max rate of 179 bpm, the longest lasting 10 beats with an avg rate of 116 bpm. Supraventricular Tachycardia was detected within +/- 45 seconds of symptomatic patient  event(s).  Isolated SVEs were rare (<1.0%), SVE Couplets were rare (<1.0%), and SVE Triplets were rare (<1.0%). Isolated VEs were rare (<1.0%). No evidence of atrial fibrillation or flutter noted  02/07/2021 Echo complete 1. Left ventricular ejection fraction, by estimation, is 60 to 65%. The  left ventricle has normal function. The left ventricle has no regional  wall motion abnormalities. Left ventricular diastolic parameters are  consistent with Grade I diastolic  dysfunction (impaired relaxation).   2. Right ventricular systolic function is normal. The right ventricular  size is normal.   3. The mitral valve is normal in structure. Mild mitral valve  regurgitation. No evidence of mitral stenosis.   4. The aortic valve was not well visualized. Aortic valve regurgitation  is not visualized. No aortic stenosis is present.   5. The inferior vena cava is normal in size with <50% respiratory  variability, suggesting right atrial pressure of 8 mmHg.   EKG:  EKG is ordered today.  The EKG ordered today demonstrates normal sinus rhythm  Recent Labs: 09/19/2023: ALT 25; Hemoglobin 14.0; Platelets 287 02/28/2024: BUN 14; Creat 0.68; Potassium 3.7; Sodium 138  Recent Lipid Panel    Component Value Date/Time   CHOL 227 (H) 09/19/2023 0927   TRIG 154 (H) 09/19/2023 0927   HDL 59 09/19/2023 0927   CHOLHDL 3.8 09/19/2023 0927   VLDL 57.6 (H) 06/22/2015 0746   LDLCALC 139 (H) 09/19/2023 0927   LDLDIRECT 96.0 06/22/2015 0746    PHYSICAL EXAM:    VS:  BP 136/76 (BP Location: Left Arm, Patient Position: Sitting, Cuff Size: Large)   Pulse 86   Resp 16   Ht 5\' 3"  (1.6 m)   Wt 196 lb (88.9 kg)   LMP 03/22/2014   SpO2 96%   BMI 34.72 kg/m   BMI: Body mass index is 34.72 kg/m.  Physical Exam Vitals and nursing note reviewed.  Constitutional:      General: She is not in acute distress.    Appearance: Normal appearance.  Cardiovascular:     Rate and Rhythm: Normal rate and regular rhythm.      Heart sounds: No murmur heard. Pulmonary:     Effort: Pulmonary effort is normal. No respiratory distress.     Breath sounds: No wheezing or rales.  Musculoskeletal:     Right lower leg: No edema.     Left  lower leg: No edema.  Skin:    General: Skin is warm and dry.  Neurological:     General: No focal deficit present.     Mental Status: She is alert and oriented to person, place, and time. Mental status is at baseline.  Psychiatric:        Mood and Affect: Mood normal.        Behavior: Behavior normal.    Wt Readings from Last 3 Encounters:  05/15/24 196 lb (88.9 kg)  05/11/24 193 lb 12.8 oz (87.9 kg)  04/17/24 196 lb (88.9 kg)    ASSESSMENT & PLAN:   Pre-operative cardiac risk assessment - Patient is scheduled for routine screening colonoscopy on 06/22/2024 with Dr. Feliberto Hopping. According to the RCRI, patient has a 0.4% risk of MACE. Patient reports activity equivalent to 4.0 METS (brisk walking/climbing 1 flight of stairs). Based on ACC/AHA guidelines, Ms. Eisenhuth would be at acceptable risk for the planned procedure without further cardiovascular testing.  Although I am ordering echocardiogram today, I do not feel that this testing should delay colonoscopy. Patient can hold Eliquis  for 2 days prior to procedure and resume as soon as possible per surgery discretion.  Paroxysmal atrial fibrillation - Patient denies palpitations, shortness of breath, and chest discomfort concerning for atrial fibrillation recurrence.  She denies bleeding and hematochezia.  CHA2DS2-VASc Score = 3 [CHF History: 0, HTN History: 1, Diabetes History: 1, Stroke History: 0, Vascular Disease History: 0, Age Score: 0, Gender Score: 1].  Therefore, the patient's annual risk of stroke is 3.2 %.   Continue diltiazem  ER 360 mg daily and Eliquis  5 mg twice daily.  Refill diltiazem  30 mg up to 3 times per day as needed for heart rate of 110 bpm or greater.   Lower extremity swelling - Patient reports lower extremity  swelling, worse in the evenings.  She denies significant dyspnea on exertion, orthopnea, and PND.  She is currently being followed by PCP and vascular surgery who suspects lymphedema.  She is taking Lasix  80 mg daily.  Check BMP today.  Echocardiogram ordered to rule out cardiac cause of lower extremity swelling.  Hypertension - Blood pressure well-managed. Continue antihypertensives as prescribed.   Disposition: F/u with Dr. Junnie Olives or an APP in 3 months.   Medication Adjustments/Labs and Tests Ordered: Current medicines are reviewed at length with the patient today.  Concerns regarding medicines are outlined above. Medication changes, Labs and Tests ordered today are summarized above and listed in the Patient Instructions accessible in Encounters.   Beather Liming, PA-C 05/15/2024 9:48 AM     [facility] [place] [address] [place], [place] [address] [phone]

## 2024-05-15 NOTE — Patient Instructions (Signed)
 Medication Instructions:  Okay to hold Eliquis   2 days prior to Colonoscopy  *If you need a refill on your cardiac medications before your next appointment, please call your pharmacy*  Lab Work: Your provider would like for you to have following labs drawn today BMP .   If you have labs (blood work) drawn today and your tests are completely normal, you will receive your results only by: MyChart Message (if you have MyChart) OR A paper copy in the mail If you have any lab test that is abnormal or we need to change your treatment, we will call you to review the results.  Testing/Procedures: Your physician has requested that you have an echocardiogram. Echocardiography is a painless test that uses sound waves to create images of your heart. It provides your doctor with information about the size and shape of your heart and how well your heart's chambers and valves are working.   You may receive an ultrasound enhancing agent through an IV if needed to better visualize your heart during the echo. This procedure takes approximately one hour.  There are no restrictions for this procedure.  This will take place at [address] [facility]) [address], [place] [address]  Please note: We ask at that you not bring children with you during ultrasound (echo/ vascular) testing. Due to room size and safety concerns, children are not allowed in the ultrasound rooms during exams. Our front office staff cannot provide observation of children in our lobby area while testing is being conducted. An adult accompanying a patient to their appointment will only be allowed in the ultrasound room at the discretion of the ultrasound technician under special circumstances. We apologize for any inconvenience.   Follow-Up: At [facility], you and your health needs are our priority.  As part of our continuing mission to provide you with exceptional heart care, our providers are all part of one team.   This team includes your primary Cardiologist (physician) and Advanced Practice Providers or APPs (Physician Assistants and Nurse Practitioners) who all work together to provide you with the care you need, when you need it.  Your next appointment:   3 month(s)  Provider:   You may see Constancia Delton, MD or one of the following Advanced Practice Providers on your designated Care Team:   Laneta Pintos, NP Gildardo Labrador, PA-C Varney Gentleman, PA-C Cadence [place], PA-C Ronald Cockayne, NP Morey Ar, NP

## 2024-05-16 LAB — BASIC METABOLIC PANEL WITH GFR
BUN/Creatinine Ratio: 21 (ref 9–23)
BUN: 15 mg/dL (ref 6–24)
CO2: 20 mmol/L (ref 20–29)
Calcium: 10.3 mg/dL — ABNORMAL HIGH (ref 8.7–10.2)
Chloride: 101 mmol/L (ref 96–106)
Creatinine, Ser: 0.71 mg/dL (ref 0.57–1.00)
Glucose: 150 mg/dL — ABNORMAL HIGH (ref 70–99)
Potassium: 4 mmol/L (ref 3.5–5.2)
Sodium: 141 mmol/L (ref 134–144)
eGFR: 99 mL/min/{1.73_m2} (ref >59)

## 2024-05-19 ENCOUNTER — Ambulatory Visit: Payer: Self-pay | Admitting: Physician Assistant

## 2024-05-21 ENCOUNTER — Ambulatory Visit: Attending: Physician Assistant

## 2024-05-21 DIAGNOSIS — R600 Localized edema: Secondary | ICD-10-CM

## 2024-05-21 DIAGNOSIS — M7989 Other specified soft tissue disorders: Secondary | ICD-10-CM | POA: Diagnosis not present

## 2024-05-21 LAB — ECHOCARDIOGRAM COMPLETE
AR max vel: 2.79 cm2
AV Area VTI: 2.92 cm2
AV Area mean vel: 2.7 cm2
AV Mean grad: 5 mmHg
AV Peak grad: 10.4 mmHg
Ao pk vel: 1.61 m/s
Area-P 1/2: 3.17 cm2
Calc EF: 64.7 %
S' Lateral: 2.9 cm
Single Plane A2C EF: 69.1 %
Single Plane A4C EF: 60.6 %

## 2024-05-28 ENCOUNTER — Other Ambulatory Visit: Payer: Self-pay | Admitting: Internal Medicine

## 2024-05-29 NOTE — Telephone Encounter (Signed)
 Requested Prescriptions  Pending Prescriptions Disp Refills   furosemide  (LASIX ) 40 MG tablet [Pharmacy Med Name: FUROSEMIDE  40 MG TABLET] 180 tablet 0    Sig: TAKE 1 TABLET BY MOUTH TWICE A DAY     Cardiovascular:  Diuretics - Loop Failed - 05/29/2024  8:49 AM      Failed - Ca in normal range and within 180 days    Calcium   Date Value Ref Range Status  05/15/2024 10.3 (H) 8.7 - 10.2 mg/dL Final         Failed - Mg Level in normal range and within 180 days    Magnesium   Date Value Ref Range Status  05/24/2021 2.0 1.7 - 2.4 mg/dL Final    Comment:    Performed at [facility], [address] [place], [place] [address]         Passed - K in normal range and within 180 days    Potassium  Date Value Ref Range Status  05/15/2024 4.0 3.5 - 5.2 mmol/L Final         Passed - Na in normal range and within 180 days    Sodium  Date Value Ref Range Status  05/15/2024 141 134 - 144 mmol/L Final         Passed - Cr in normal range and within 180 days    Creat  Date Value Ref Range Status  02/28/2024 0.68 0.50 - 1.03 mg/dL Final   Creatinine, Ser  Date Value Ref Range Status  05/15/2024 0.71 0.57 - 1.00 mg/dL Final   Creatinine, Urine  Date Value Ref Range Status  09/19/2023 81 20 - 275 mg/dL Final         Passed - Cl in normal range and within 180 days    Chloride  Date Value Ref Range Status  05/15/2024 101 96 - 106 mmol/L Final         Passed - Last BP in normal range    BP Readings from Last 1 Encounters:  05/15/24 136/76         Passed - Valid encounter within last 6 months    Recent Outpatient Visits           3 months ago Peripheral edema   [place] [place], Rankin Buzzard, NP   3 months ago Peripheral edema   [facility] [place], Rankin Buzzard, NP       Future Appointments             In 3 months Agbor-Etang, Polly Brink, MD [facility]

## 2024-06-01 ENCOUNTER — Other Ambulatory Visit: Payer: Self-pay | Admitting: Cardiology

## 2024-06-01 DIAGNOSIS — I480 Paroxysmal atrial fibrillation: Secondary | ICD-10-CM

## 2024-06-01 DIAGNOSIS — Z7901 Long term (current) use of anticoagulants: Secondary | ICD-10-CM

## 2024-06-01 NOTE — Telephone Encounter (Signed)
 Prescription refill request for Eliquis  received. Indication:afib Last office visit:5/25 Scr:0.71  5/25 Age: 58 Weight:88.9  kg  Prescription refilled

## 2024-06-30 ENCOUNTER — Other Ambulatory Visit: Payer: Self-pay | Admitting: Internal Medicine

## 2024-07-02 NOTE — Telephone Encounter (Signed)
 Requested Prescriptions  Pending Prescriptions Disp Refills   OZEMPIC , 0.25 OR 0.5 MG/DOSE, 2 MG/3ML SOPN [Pharmacy Med Name: OZEMPIC  0.25-0.5 MG/DOSE PEN] 9 mL 0    Sig: INJECT 0.25 MG SUBCUTANEOUSLY ONE TIME PER WEEK     Endocrinology:  Diabetes - GLP-1 Receptor Agonists - semaglutide  Failed - 07/02/2024 10:49 AM      Failed - HBA1C in normal range and within 180 days    Hgb A1c MFr Bld  Date Value Ref Range Status  09/19/2023 6.9 (H) <5.7 % of total Hgb Final    Comment:    For someone without known diabetes, a hemoglobin A1c value of 6.5% or greater indicates that they may have  diabetes and this should be confirmed with a follow-up  test. . For someone with known diabetes, a value <7% indicates  that their diabetes is well controlled and a value  greater than or equal to 7% indicates suboptimal  control. A1c targets should be individualized based on  duration of diabetes, age, comorbid conditions, and  other considerations. . Currently, no consensus exists regarding use of hemoglobin A1c for diagnosis of diabetes for children. .          Passed - Cr in normal range and within 360 days    Creat  Date Value Ref Range Status  02/28/2024 0.68 0.50 - 1.03 mg/dL Final   Creatinine, Ser  Date Value Ref Range Status  05/15/2024 0.71 0.57 - 1.00 mg/dL Final   Creatinine, Urine  Date Value Ref Range Status  09/19/2023 81 20 - 275 mg/dL Final         Passed - Valid encounter within last 6 months    Recent Outpatient Visits           4 months ago Peripheral edema   [place] [place], Angeline ORN, NP   4 months ago Peripheral edema   [facility] [place], Angeline ORN, NP       Future Appointments             In 1 month Agbor-Etang, Redell, MD [facility]

## 2024-08-11 ENCOUNTER — Ambulatory Visit (INDEPENDENT_AMBULATORY_CARE_PROVIDER_SITE_OTHER): Admitting: Nurse Practitioner

## 2024-08-16 ENCOUNTER — Other Ambulatory Visit: Payer: Self-pay | Admitting: Physician Assistant

## 2024-08-28 ENCOUNTER — Ambulatory Visit: Attending: Cardiology | Admitting: Cardiology

## 2024-10-22 ENCOUNTER — Other Ambulatory Visit: Payer: Self-pay | Admitting: Obstetrics and Gynecology

## 2024-10-22 DIAGNOSIS — Z87891 Personal history of nicotine dependence: Secondary | ICD-10-CM

## 2024-11-22 ENCOUNTER — Other Ambulatory Visit: Payer: Self-pay | Admitting: Internal Medicine

## 2024-11-24 ENCOUNTER — Telehealth: Payer: Self-pay | Admitting: Internal Medicine

## 2024-11-24 NOTE — Telephone Encounter (Unsigned)
 Copied from CRM [id number]. Topic: Clinical - Medication Refill >> Nov 24, 2024 11:50 AM Tiffini S wrote: Medication: amLODipine -olmesartan  (AZOR ) 10-40 MG tablet,  metFORMIN  (GLUCOPHAGE ) 500 MG tablet,  furosemide  (LASIX ) 40 MG tablet  OZEMPIC , 0.25 OR 0.5 MG/DOSE, 2 MG/3ML SOPN  Has the patient contacted their pharmacy? Yes (Agent: If no, request that the patient contact the pharmacy for the refill. If patient does not wish to contact the pharmacy document the reason why and proceed with request.) (Agent: If yes, when and what did the pharmacy advise?)  This is the patient's preferred pharmacy:  [id number] - [place], [place] - [address] [place] [address] Phone: [phone] Fax: [phone]  Is this the correct pharmacy for this prescription? Yes If no, delete pharmacy and type the correct one.   Has the prescription been filled recently? Yes  Is the patient out of the medication? Yes  Has the patient been seen for an appointment in the last year OR does the patient have an upcoming appointment? Yes  Can we respond through MyChart? Yes  Agent: Please be advised that Rx refills may take up to 3 business days. We ask that you follow-up with your pharmacy.

## 2024-11-25 ENCOUNTER — Telehealth: Payer: Self-pay

## 2024-11-25 MED ORDER — AMLODIPINE-OLMESARTAN 10-40 MG PO TABS: 1.0000 | ORAL_TABLET | Freq: Every day | ORAL | 0 refills | Status: AC

## 2024-11-25 NOTE — Telephone Encounter (Signed)
 Copied from CRM [id number]. Topic: Clinical - Prescription Issue >> Nov 25, 2024 12:45 PM Wanda Aguilar wrote: Reason for CRM: Pt is calling to get an update on medication req sent in 11/30. Pt is running low on high blood pressure. Please advise pt on #[phone]

## 2024-11-25 NOTE — Addendum Note (Signed)
 Addended by: ZELIA GAUZE D on: 11/25/2024 03:41 PM   Modules accepted: Orders

## 2024-11-25 NOTE — Telephone Encounter (Signed)
 Med refilled and patient advised to schedule follow up with [facility]. Patient needs to call back to schedule after checking her work schedule.

## 2024-11-25 NOTE — Telephone Encounter (Signed)
[facility] to fill medication?  

## 2024-11-25 NOTE — Telephone Encounter (Signed)
 Requested medication (s) are due for refill today - yes  Requested medication (s) are on the active medication list -yes  Future visit scheduled -no  Last refill: Amlodipine  - 05/08/24 #90                 Metformin - 01/28/23 #180                  Furosamide- 05/29/24 #180 Notes to clinic: Attempted to contact patient to schedule appointment- left message to call office, fails visit and partial lab protocol- sent for PCP review   Requested Prescriptions  Pending Prescriptions Disp Refills   amLODipine -olmesartan  (AZOR ) 10-40 MG tablet [Pharmacy Med Name: AMLODIPINE -OLMESARTAN  10-40 MG] 90 tablet 0    Sig: TAKE 1 TABLET BY MOUTH EVERY DAY     Cardiovascular: CCB + ARB Combos Failed - 11/25/2024  3:25 PM      Failed - K in normal range and within 180 days    Potassium  Date Value Ref Range Status  05/15/2024 4.0 3.5 - 5.2 mmol/L Final         Failed - Cr in normal range and within 180 days    Creat  Date Value Ref Range Status  02/28/2024 0.68 0.50 - 1.03 mg/dL Final   Creatinine, Ser  Date Value Ref Range Status  05/15/2024 0.71 0.57 - 1.00 mg/dL Final   Creatinine, Urine  Date Value Ref Range Status  09/19/2023 81 20 - 275 mg/dL Final         Failed - Na in normal range and within 180 days    Sodium  Date Value Ref Range Status  05/15/2024 141 134 - 144 mmol/L Final         Failed - Valid encounter within last 6 months    Recent Outpatient Visits           9 months ago Peripheral edema   [facility] [place], Angeline ORN, NP   9 months ago Peripheral edema   [facility] [place], Angeline ORN, [place]              Passed - Patient is not pregnant      Passed - Last BP in normal range    BP Readings from Last 1 Encounters:  05/15/24 136/76          metFORMIN  (GLUCOPHAGE ) 500 MG tablet [Pharmacy Med Name: METFORMIN  HCL 500 MG TABLET] 180 tablet 0    Sig: TAKE 1 TABLET BY MOUTH TWICE A DAY WITH FOOD     Endocrinology:   Diabetes - Biguanides Failed - 11/25/2024  3:25 PM      Failed - HBA1C is between 0 and 7.9 and within 180 days    Hgb A1c MFr Bld  Date Value Ref Range Status  09/19/2023 6.9 (H) <5.7 % of total Hgb Final    Comment:    For someone without known diabetes, a hemoglobin A1c value of 6.5% or greater indicates that they may have  diabetes and this should be confirmed with a follow-up  test. . For someone with known diabetes, a value <7% indicates  that their diabetes is well controlled and a value  greater than or equal to 7% indicates suboptimal  control. A1c targets should be individualized based on  duration of diabetes, age, comorbid conditions, and  other considerations. . Currently, no consensus exists regarding use of hemoglobin A1c for diagnosis of diabetes for children. SABRA  Failed - B12 Level in normal range and within 720 days    Vitamin B-12  Date Value Ref Range Status  06/22/2015 188 (L) 211 - 911 pg/mL Final         Failed - Valid encounter within last 6 months    Recent Outpatient Visits           9 months ago Peripheral edema   [facility] [place], Angeline ORN, NP   9 months ago Peripheral edema   [place] [facility] [place], [place] W, NP              Failed - CBC within normal limits and completed in the last 12 months    WBC  Date Value Ref Range Status  09/19/2023 6.4 3.8 - 10.8 Thousand/uL Final   RBC  Date Value Ref Range Status  09/19/2023 4.43 3.80 - 5.10 Million/uL Final   Hemoglobin  Date Value Ref Range Status  09/19/2023 14.0 11.7 - 15.5 g/dL Final  97/82/7977 85.2 11.1 - 15.9 g/dL Final   HCT  Date Value Ref Range Status  09/19/2023 41.7 35.0 - 45.0 % Final   Hematocrit  Date Value Ref Range Status  02/09/2021 42.0 34.0 - 46.6 % Final   MCHC  Date Value Ref Range Status  09/19/2023 33.6 32.0 - 36.0 g/dL Final   [facility]  Date Value Ref Range Status  09/19/2023 31.6 27.0 - 33.0  pg Final   MCV  Date Value Ref Range Status  09/19/2023 94.1 80.0 - 100.0 fL Final  02/09/2021 92 79 - 97 fL Final   No results found for: PLTCOUNTKUC, LABPLAT, POCPLA RDW  Date Value Ref Range Status  09/19/2023 12.7 11.0 - 15.0 % Final  02/09/2021 11.9 11.7 - 15.4 % Final         Passed - Cr in normal range and within 360 days    Creat  Date Value Ref Range Status  02/28/2024 0.68 0.50 - 1.03 mg/dL Final   Creatinine, Ser  Date Value Ref Range Status  05/15/2024 0.71 0.57 - 1.00 mg/dL Final   Creatinine, Urine  Date Value Ref Range Status  09/19/2023 81 20 - 275 mg/dL Final         Passed - eGFR in normal range and within 360 days    GFR calc Af Amer  Date Value Ref Range Status  02/09/2021 118 >59 mL/min/1.73 Final    Comment:    **In accordance with recommendations from the NKF-ASN Task force,**   [facility] is in the process of updating its eGFR calculation to the   2021 CKD-EPI creatinine equation that estimates kidney function   without a race variable.    GFR, Estimated  Date Value Ref Range Status  02/20/2023 >60 >60 mL/min Final    Comment:    (NOTE) Calculated using the CKD-EPI Creatinine Equation (2021)    GFR  Date Value Ref Range Status  06/22/2015 74.54 >60.00 mL/min Final   eGFR  Date Value Ref Range Status  05/15/2024 99 >59 mL/min/1.73 Final          furosemide  (LASIX ) 40 MG tablet [Pharmacy Med Name: FUROSEMIDE  40 MG TABLET] 180 tablet 0    Sig: TAKE 1 TABLET BY MOUTH TWICE A DAY     Cardiovascular:  Diuretics - Loop Failed - 11/25/2024  3:25 PM      Failed - K in normal range and within 180 days    Potassium  Date Value Ref Range Status  05/15/2024 4.0 3.5 - 5.2 mmol/L Final         Failed - Ca in normal range and within 180 days    Calcium   Date Value Ref Range Status  05/15/2024 10.3 (H) 8.7 - 10.2 mg/dL Final         Failed - Na in normal range and within 180 days    Sodium  Date Value Ref Range Status  05/15/2024  141 134 - 144 mmol/L Final         Failed - Cr in normal range and within 180 days    Creat  Date Value Ref Range Status  02/28/2024 0.68 0.50 - 1.03 mg/dL Final   Creatinine, Ser  Date Value Ref Range Status  05/15/2024 0.71 0.57 - 1.00 mg/dL Final   Creatinine, Urine  Date Value Ref Range Status  09/19/2023 81 20 - 275 mg/dL Final         Failed - Cl in normal range and within 180 days    Chloride  Date Value Ref Range Status  05/15/2024 101 96 - 106 mmol/L Final         Failed - Mg Level in normal range and within 180 days    Magnesium   Date Value Ref Range Status  05/24/2021 2.0 1.7 - 2.4 mg/dL Final    Comment:    Performed at [facility], [address], [place], [place] [address]         Failed - Valid encounter within last 6 months    Recent Outpatient Visits           9 months ago Peripheral edema   [place] [facility] [place], Angeline ORN, NP   9 months ago Peripheral edema   [facility] [place], [place] W, NP              Passed - Last BP in normal range    BP Readings from Last 1 Encounters:  05/15/24 136/76            Requested Prescriptions  Pending Prescriptions Disp Refills   amLODipine -olmesartan  (AZOR ) 10-40 MG tablet [Pharmacy Med Name: AMLODIPINE -OLMESARTAN  10-40 MG] 90 tablet 0    Sig: TAKE 1 TABLET BY MOUTH EVERY DAY     Cardiovascular: CCB + ARB Combos Failed - 11/25/2024  3:25 PM      Failed - K in normal range and within 180 days    Potassium  Date Value Ref Range Status  05/15/2024 4.0 3.5 - 5.2 mmol/L Final         Failed - Cr in normal range and within 180 days    Creat  Date Value Ref Range Status  02/28/2024 0.68 0.50 - 1.03 mg/dL Final   Creatinine, Ser  Date Value Ref Range Status  05/15/2024 0.71 0.57 - 1.00 mg/dL Final   Creatinine, Urine  Date Value Ref Range Status  09/19/2023 81 20 - 275 mg/dL Final         Failed - Na in normal range and within 180  days    Sodium  Date Value Ref Range Status  05/15/2024 141 134 - 144 mmol/L Final         Failed - Valid encounter within last 6 months    Recent Outpatient Visits           9 months ago Peripheral edema   [place] [facility] [place], [place]  W, NP   9 months ago Peripheral edema   [place] [place], Angeline ORN, [place]              Ejddzi - Patient is not pregnant      Passed - Last BP in normal range    BP Readings from Last 1 Encounters:  05/15/24 136/76          metFORMIN  (GLUCOPHAGE ) 500 MG tablet [Pharmacy Med Name: METFORMIN  HCL 500 MG TABLET] 180 tablet 0    Sig: TAKE 1 TABLET BY MOUTH TWICE A DAY WITH FOOD     Endocrinology:  Diabetes - Biguanides Failed - 11/25/2024  3:25 PM      Failed - HBA1C is between 0 and 7.9 and within 180 days    Hgb A1c MFr Bld  Date Value Ref Range Status  09/19/2023 6.9 (H) <5.7 % of total Hgb Final    Comment:    For someone without known diabetes, a hemoglobin A1c value of 6.5% or greater indicates that they may have  diabetes and this should be confirmed with a follow-up  test. . For someone with known diabetes, a value <7% indicates  that their diabetes is well controlled and a value  greater than or equal to 7% indicates suboptimal  control. A1c targets should be individualized based on  duration of diabetes, age, comorbid conditions, and  other considerations. . Currently, no consensus exists regarding use of hemoglobin A1c for diagnosis of diabetes for children. .          Failed - B12 Level in normal range and within 720 days    Vitamin B-12  Date Value Ref Range Status  06/22/2015 188 (L) 211 - 911 pg/mL Final         Failed - Valid encounter within last 6 months    Recent Outpatient Visits           9 months ago Peripheral edema   [place] [facility] [place], Angeline ORN, NP   9 months ago Peripheral edema   [place] [facility]  [place], [place] W, NP              Failed - CBC within normal limits and completed in the last 12 months    WBC  Date Value Ref Range Status  09/19/2023 6.4 3.8 - 10.8 Thousand/uL Final   RBC  Date Value Ref Range Status  09/19/2023 4.43 3.80 - 5.10 Million/uL Final   Hemoglobin  Date Value Ref Range Status  09/19/2023 14.0 11.7 - 15.5 g/dL Final  97/82/7977 85.2 11.1 - 15.9 g/dL Final   HCT  Date Value Ref Range Status  09/19/2023 41.7 35.0 - 45.0 % Final   Hematocrit  Date Value Ref Range Status  02/09/2021 42.0 34.0 - 46.6 % Final   MCHC  Date Value Ref Range Status  09/19/2023 33.6 32.0 - 36.0 g/dL Final   [facility]  Date Value Ref Range Status  09/19/2023 31.6 27.0 - 33.0 pg Final   MCV  Date Value Ref Range Status  09/19/2023 94.1 80.0 - 100.0 fL Final  02/09/2021 92 79 - 97 fL Final   No results found for: PLTCOUNTKUC, LABPLAT, POCPLA RDW  Date Value Ref Range Status  09/19/2023 12.7 11.0 - 15.0 % Final  02/09/2021 11.9 11.7 - 15.4 % Final         Passed - Cr in normal range and within 360 days  Creat  Date Value Ref Range Status  02/28/2024 0.68 0.50 - 1.03 mg/dL Final   Creatinine, Ser  Date Value Ref Range Status  05/15/2024 0.71 0.57 - 1.00 mg/dL Final   Creatinine, Urine  Date Value Ref Range Status  09/19/2023 81 20 - 275 mg/dL Final         Passed - eGFR in normal range and within 360 days    GFR calc Af Amer  Date Value Ref Range Status  02/09/2021 118 >59 mL/min/1.73 Final    Comment:    **In accordance with recommendations from the NKF-ASN Task force,**   [facility] is in the process of updating its eGFR calculation to the   2021 CKD-EPI creatinine equation that estimates kidney function   without a race variable.    GFR, Estimated  Date Value Ref Range Status  02/20/2023 >60 >60 mL/min Final    Comment:    (NOTE) Calculated using the CKD-EPI Creatinine Equation (2021)    GFR  Date Value Ref Range Status  06/22/2015  74.54 >60.00 mL/min Final   eGFR  Date Value Ref Range Status  05/15/2024 99 >59 mL/min/1.73 Final          furosemide  (LASIX ) 40 MG tablet [Pharmacy Med Name: FUROSEMIDE  40 MG TABLET] 180 tablet 0    Sig: TAKE 1 TABLET BY MOUTH TWICE A DAY     Cardiovascular:  Diuretics - Loop Failed - 11/25/2024  3:25 PM      Failed - K in normal range and within 180 days    Potassium  Date Value Ref Range Status  05/15/2024 4.0 3.5 - 5.2 mmol/L Final         Failed - Ca in normal range and within 180 days    Calcium   Date Value Ref Range Status  05/15/2024 10.3 (H) 8.7 - 10.2 mg/dL Final         Failed - Na in normal range and within 180 days    Sodium  Date Value Ref Range Status  05/15/2024 141 134 - 144 mmol/L Final         Failed - Cr in normal range and within 180 days    Creat  Date Value Ref Range Status  02/28/2024 0.68 0.50 - 1.03 mg/dL Final   Creatinine, Ser  Date Value Ref Range Status  05/15/2024 0.71 0.57 - 1.00 mg/dL Final   Creatinine, Urine  Date Value Ref Range Status  09/19/2023 81 20 - 275 mg/dL Final         Failed - Cl in normal range and within 180 days    Chloride  Date Value Ref Range Status  05/15/2024 101 96 - 106 mmol/L Final         Failed - Mg Level in normal range and within 180 days    Magnesium   Date Value Ref Range Status  05/24/2021 2.0 1.7 - 2.4 mg/dL Final    Comment:    Performed at [facility], [address] [place], [place] [address]         Failed - Valid encounter within last 6 months    Recent Outpatient Visits           9 months ago Peripheral edema   [place] [facility] [place], Angeline ORN, NP   9 months ago Peripheral edema   [facility] [place], Angeline ORN, NP  Passed - Last BP in normal range    BP Readings from Last 1 Encounters:  05/15/24 136/76

## 2024-11-27 NOTE — Telephone Encounter (Signed)
 Requested medications are due for refill today.  yes  Requested medications are on the active medications list.  yes  Last refill. varied  Future visit scheduled.   no  Notes to clinic.  Expired labs - pt needs an ov.    Requested Prescriptions  Pending Prescriptions Disp Refills   furosemide  (LASIX ) 40 MG tablet 180 tablet 0    Sig: Take 1 tablet (40 mg total) by mouth 2 (two) times daily.     Cardiovascular:  Diuretics - Loop Failed - 11/27/2024 11:52 AM      Failed - K in normal range and within 180 days    Potassium  Date Value Ref Range Status  05/15/2024 4.0 3.5 - 5.2 mmol/L Final         Failed - Ca in normal range and within 180 days    Calcium   Date Value Ref Range Status  05/15/2024 10.3 (H) 8.7 - 10.2 mg/dL Final         Failed - Na in normal range and within 180 days    Sodium  Date Value Ref Range Status  05/15/2024 141 134 - 144 mmol/L Final         Failed - Cr in normal range and within 180 days    Creat  Date Value Ref Range Status  02/28/2024 0.68 0.50 - 1.03 mg/dL Final   Creatinine, Ser  Date Value Ref Range Status  05/15/2024 0.71 0.57 - 1.00 mg/dL Final   Creatinine, Urine  Date Value Ref Range Status  09/19/2023 81 20 - 275 mg/dL Final         Failed - Cl in normal range and within 180 days    Chloride  Date Value Ref Range Status  05/15/2024 101 96 - 106 mmol/L Final         Failed - Mg Level in normal range and within 180 days    Magnesium   Date Value Ref Range Status  05/24/2021 2.0 1.7 - 2.4 mg/dL Final    Comment:    Performed at [facility], [address] [place], [place] [address]         Failed - Valid encounter within last 6 months    Recent Outpatient Visits           9 months ago Peripheral edema   [facility] [facility] [place], Angeline ORN, NP   9 months ago Peripheral edema   [place] [facility] [place], [place] W, NP              Passed - Last BP in normal range     BP Readings from Last 1 Encounters:  05/15/24 136/76          metFORMIN  (GLUCOPHAGE ) 500 MG tablet 180 tablet 0    Sig: Take 1 tablet (500 mg total) by mouth 2 (two) times daily with a meal.     Endocrinology:  Diabetes - Biguanides Failed - 11/27/2024 11:52 AM      Failed - HBA1C is between 0 and 7.9 and within 180 days    Hgb A1c MFr Bld  Date Value Ref Range Status  09/19/2023 6.9 (H) <5.7 % of total Hgb Final    Comment:    For someone without known diabetes, a hemoglobin A1c value of 6.5% or greater indicates that they may have  diabetes and this should be confirmed with a follow-up  test. . For someone with known diabetes, a value <7%  indicates  that their diabetes is well controlled and a value  greater than or equal to 7% indicates suboptimal  control. A1c targets should be individualized based on  duration of diabetes, age, comorbid conditions, and  other considerations. . Currently, no consensus exists regarding use of hemoglobin A1c for diagnosis of diabetes for children. .          Failed - B12 Level in normal range and within 720 days    Vitamin B-12  Date Value Ref Range Status  06/22/2015 188 (L) 211 - 911 pg/mL Final         Failed - Valid encounter within last 6 months    Recent Outpatient Visits           9 months ago Peripheral edema   [place] [facility] [place], Angeline ORN, NP   9 months ago Peripheral edema   [place] [facility] [place], [place] W, NP              Failed - CBC within normal limits and completed in the last 12 months    WBC  Date Value Ref Range Status  09/19/2023 6.4 3.8 - 10.8 Thousand/uL Final   RBC  Date Value Ref Range Status  09/19/2023 4.43 3.80 - 5.10 Million/uL Final   Hemoglobin  Date Value Ref Range Status  09/19/2023 14.0 11.7 - 15.5 g/dL Final  97/82/7977 85.2 11.1 - 15.9 g/dL Final   HCT  Date Value Ref Range Status  09/19/2023 41.7 35.0 - 45.0 % Final    Hematocrit  Date Value Ref Range Status  02/09/2021 42.0 34.0 - 46.6 % Final   MCHC  Date Value Ref Range Status  09/19/2023 33.6 32.0 - 36.0 g/dL Final   [facility]  Date Value Ref Range Status  09/19/2023 31.6 27.0 - 33.0 pg Final   MCV  Date Value Ref Range Status  09/19/2023 94.1 80.0 - 100.0 fL Final  02/09/2021 92 79 - 97 fL Final   No results found for: PLTCOUNTKUC, LABPLAT, POCPLA RDW  Date Value Ref Range Status  09/19/2023 12.7 11.0 - 15.0 % Final  02/09/2021 11.9 11.7 - 15.4 % Final         Passed - Cr in normal range and within 360 days    Creat  Date Value Ref Range Status  02/28/2024 0.68 0.50 - 1.03 mg/dL Final   Creatinine, Ser  Date Value Ref Range Status  05/15/2024 0.71 0.57 - 1.00 mg/dL Final   Creatinine, Urine  Date Value Ref Range Status  09/19/2023 81 20 - 275 mg/dL Final         Passed - eGFR in normal range and within 360 days    GFR calc Af Amer  Date Value Ref Range Status  02/09/2021 118 >59 mL/min/1.73 Final    Comment:    **In accordance with recommendations from the NKF-ASN Task force,**   [facility] is in the process of updating its eGFR calculation to the   2021 CKD-EPI creatinine equation that estimates kidney function   without a race variable.    GFR, Estimated  Date Value Ref Range Status  02/20/2023 >60 >60 mL/min Final    Comment:    (NOTE) Calculated using the CKD-EPI Creatinine Equation (2021)    GFR  Date Value Ref Range Status  06/22/2015 74.54 >60.00 mL/min Final   eGFR  Date Value Ref Range Status  05/15/2024 99 >59 mL/min/1.73 Final  OZEMPIC , 0.25 OR 0.5 MG/DOSE, 2 MG/3ML SOPN 9 mL 0     Endocrinology:  Diabetes - GLP-1 Receptor Agonists - semaglutide  Failed - 11/27/2024 11:52 AM      Failed - HBA1C in normal range and within 180 days    Hgb A1c MFr Bld  Date Value Ref Range Status  09/19/2023 6.9 (H) <5.7 % of total Hgb Final    Comment:    For someone without known diabetes, a  hemoglobin A1c value of 6.5% or greater indicates that they may have  diabetes and this should be confirmed with a follow-up  test. . For someone with known diabetes, a value <7% indicates  that their diabetes is well controlled and a value  greater than or equal to 7% indicates suboptimal  control. A1c targets should be individualized based on  duration of diabetes, age, comorbid conditions, and  other considerations. . Currently, no consensus exists regarding use of hemoglobin A1c for diagnosis of diabetes for children. .          Failed - Valid encounter within last 6 months    Recent Outpatient Visits           9 months ago Peripheral edema   [facility] [place], [place], NP   9 months ago Peripheral edema   [place] [facility] [place], [place], [place]              Passed - Cr in normal range and within 360 days    Creat  Date Value Ref Range Status  02/28/2024 0.68 0.50 - 1.03 mg/dL Final   Creatinine, Ser  Date Value Ref Range Status  05/15/2024 0.71 0.57 - 1.00 mg/dL Final   Creatinine, Urine  Date Value Ref Range Status  09/19/2023 81 20 - 275 mg/dL Final         Refused Prescriptions Disp Refills   amLODipine -olmesartan  (AZOR ) 10-40 MG tablet 90 tablet 0    Sig: Take 1 tablet by mouth daily.     Cardiovascular: CCB + ARB Combos Failed - 11/27/2024 11:52 AM      Failed - K in normal range and within 180 days    Potassium  Date Value Ref Range Status  05/15/2024 4.0 3.5 - 5.2 mmol/L Final         Failed - Cr in normal range and within 180 days    Creat  Date Value Ref Range Status  02/28/2024 0.68 0.50 - 1.03 mg/dL Final   Creatinine, Ser  Date Value Ref Range Status  05/15/2024 0.71 0.57 - 1.00 mg/dL Final   Creatinine, Urine  Date Value Ref Range Status  09/19/2023 81 20 - 275 mg/dL Final         Failed - Na in normal range and within 180 days    Sodium  Date Value Ref Range Status   05/15/2024 141 134 - 144 mmol/L Final         Failed - Valid encounter within last 6 months    Recent Outpatient Visits           9 months ago Peripheral edema   [place] [facility] [place], Angeline ORN, NP   9 months ago Peripheral edema   [place] [facility] [place], Angeline ORN, [place]              Passed - Patient is not pregnant      Passed - Last  BP in normal range    BP Readings from Last 1 Encounters:  05/15/24 136/76          sg

## 2024-11-27 NOTE — Telephone Encounter (Signed)
 Requested Prescriptions  Pending Prescriptions Disp Refills   furosemide  (LASIX ) 40 MG tablet 180 tablet 0    Sig: Take 1 tablet (40 mg total) by mouth 2 (two) times daily.     Cardiovascular:  Diuretics - Loop Failed - 11/27/2024 11:51 AM      Failed - K in normal range and within 180 days    Potassium  Date Value Ref Range Status  05/15/2024 4.0 3.5 - 5.2 mmol/L Final         Failed - Ca in normal range and within 180 days    Calcium   Date Value Ref Range Status  05/15/2024 10.3 (H) 8.7 - 10.2 mg/dL Final         Failed - Na in normal range and within 180 days    Sodium  Date Value Ref Range Status  05/15/2024 141 134 - 144 mmol/L Final         Failed - Cr in normal range and within 180 days    Creat  Date Value Ref Range Status  02/28/2024 0.68 0.50 - 1.03 mg/dL Final   Creatinine, Ser  Date Value Ref Range Status  05/15/2024 0.71 0.57 - 1.00 mg/dL Final   Creatinine, Urine  Date Value Ref Range Status  09/19/2023 81 20 - 275 mg/dL Final         Failed - Cl in normal range and within 180 days    Chloride  Date Value Ref Range Status  05/15/2024 101 96 - 106 mmol/L Final         Failed - Mg Level in normal range and within 180 days    Magnesium   Date Value Ref Range Status  05/24/2021 2.0 1.7 - 2.4 mg/dL Final    Comment:    Performed at [facility], [address] [place], [place] [address]         Failed - Valid encounter within last 6 months    Recent Outpatient Visits           9 months ago Peripheral edema   [facility] [place], Angeline ORN, NP   9 months ago Peripheral edema   [place] [facility] [place], Regina W, NP              Passed - Last BP in normal range    BP Readings from Last 1 Encounters:  05/15/24 136/76          metFORMIN  (GLUCOPHAGE ) 500 MG tablet 180 tablet 0    Sig: Take 1 tablet (500 mg total) by mouth 2 (two) times daily with a meal.     Endocrinology:  Diabetes  - Biguanides Failed - 11/27/2024 11:51 AM      Failed - HBA1C is between 0 and 7.9 and within 180 days    Hgb A1c MFr Bld  Date Value Ref Range Status  09/19/2023 6.9 (H) <5.7 % of total Hgb Final    Comment:    For someone without known diabetes, a hemoglobin A1c value of 6.5% or greater indicates that they may have  diabetes and this should be confirmed with a follow-up  test. . For someone with known diabetes, a value <7% indicates  that their diabetes is well controlled and a value  greater than or equal to 7% indicates suboptimal  control. A1c targets should be individualized based on  duration of diabetes, age, comorbid conditions, and  other considerations. . Currently, no consensus exists regarding  use of hemoglobin A1c for diagnosis of diabetes for children. .          Failed - B12 Level in normal range and within 720 days    Vitamin B-12  Date Value Ref Range Status  06/22/2015 188 (L) 211 - 911 pg/mL Final         Failed - Valid encounter within last 6 months    Recent Outpatient Visits           9 months ago Peripheral edema   [facility] [place], Angeline ORN, NP   9 months ago Peripheral edema   [place] [facility] [place], [place] W, NP              Failed - CBC within normal limits and completed in the last 12 months    WBC  Date Value Ref Range Status  09/19/2023 6.4 3.8 - 10.8 Thousand/uL Final   RBC  Date Value Ref Range Status  09/19/2023 4.43 3.80 - 5.10 Million/uL Final   Hemoglobin  Date Value Ref Range Status  09/19/2023 14.0 11.7 - 15.5 g/dL Final  97/82/7977 85.2 11.1 - 15.9 g/dL Final   HCT  Date Value Ref Range Status  09/19/2023 41.7 35.0 - 45.0 % Final   Hematocrit  Date Value Ref Range Status  02/09/2021 42.0 34.0 - 46.6 % Final   MCHC  Date Value Ref Range Status  09/19/2023 33.6 32.0 - 36.0 g/dL Final   [facility]  Date Value Ref Range Status  09/19/2023 31.6 27.0 - 33.0 pg Final    MCV  Date Value Ref Range Status  09/19/2023 94.1 80.0 - 100.0 fL Final  02/09/2021 92 79 - 97 fL Final   No results found for: PLTCOUNTKUC, LABPLAT, POCPLA RDW  Date Value Ref Range Status  09/19/2023 12.7 11.0 - 15.0 % Final  02/09/2021 11.9 11.7 - 15.4 % Final         Passed - Cr in normal range and within 360 days    Creat  Date Value Ref Range Status  02/28/2024 0.68 0.50 - 1.03 mg/dL Final   Creatinine, Ser  Date Value Ref Range Status  05/15/2024 0.71 0.57 - 1.00 mg/dL Final   Creatinine, Urine  Date Value Ref Range Status  09/19/2023 81 20 - 275 mg/dL Final         Passed - eGFR in normal range and within 360 days    GFR calc Af Amer  Date Value Ref Range Status  02/09/2021 118 >59 mL/min/1.73 Final    Comment:    **In accordance with recommendations from the NKF-ASN Task force,**   [facility] is in the process of updating its eGFR calculation to the   2021 CKD-EPI creatinine equation that estimates kidney function   without a race variable.    GFR, Estimated  Date Value Ref Range Status  02/20/2023 >60 >60 mL/min Final    Comment:    (NOTE) Calculated using the CKD-EPI Creatinine Equation (2021)    GFR  Date Value Ref Range Status  06/22/2015 74.54 >60.00 mL/min Final   eGFR  Date Value Ref Range Status  05/15/2024 99 >59 mL/min/1.73 Final          OZEMPIC , 0.25 OR 0.5 MG/DOSE, 2 MG/3ML SOPN 9 mL 0     Endocrinology:  Diabetes - GLP-1 Receptor Agonists - semaglutide  Failed - 11/27/2024 11:51 AM      Failed - HBA1C in normal range and within  180 days    Hgb A1c MFr Bld  Date Value Ref Range Status  09/19/2023 6.9 (H) <5.7 % of total Hgb Final    Comment:    For someone without known diabetes, a hemoglobin A1c value of 6.5% or greater indicates that they may have  diabetes and this should be confirmed with a follow-up  test. . For someone with known diabetes, a value <7% indicates  that their diabetes is well controlled and a value   greater than or equal to 7% indicates suboptimal  control. A1c targets should be individualized based on  duration of diabetes, age, comorbid conditions, and  other considerations. . Currently, no consensus exists regarding use of hemoglobin A1c for diagnosis of diabetes for children. .          Failed - Valid encounter within last 6 months    Recent Outpatient Visits           9 months ago Peripheral edema   [facility] [facility] [place], [place], NP   9 months ago Peripheral edema   [place] [facility] [place], [place], [place]              Passed - Cr in normal range and within 360 days    Creat  Date Value Ref Range Status  02/28/2024 0.68 0.50 - 1.03 mg/dL Final   Creatinine, Ser  Date Value Ref Range Status  05/15/2024 0.71 0.57 - 1.00 mg/dL Final   Creatinine, Urine  Date Value Ref Range Status  09/19/2023 81 20 - 275 mg/dL Final         Refused Prescriptions Disp Refills   amLODipine -olmesartan  (AZOR ) 10-40 MG tablet 90 tablet 0    Sig: Take 1 tablet by mouth daily.     Cardiovascular: CCB + ARB Combos Failed - 11/27/2024 11:51 AM      Failed - K in normal range and within 180 days    Potassium  Date Value Ref Range Status  05/15/2024 4.0 3.5 - 5.2 mmol/L Final         Failed - Cr in normal range and within 180 days    Creat  Date Value Ref Range Status  02/28/2024 0.68 0.50 - 1.03 mg/dL Final   Creatinine, Ser  Date Value Ref Range Status  05/15/2024 0.71 0.57 - 1.00 mg/dL Final   Creatinine, Urine  Date Value Ref Range Status  09/19/2023 81 20 - 275 mg/dL Final         Failed - Na in normal range and within 180 days    Sodium  Date Value Ref Range Status  05/15/2024 141 134 - 144 mmol/L Final         Failed - Valid encounter within last 6 months    Recent Outpatient Visits           9 months ago Peripheral edema   [facility] [facility] [place], Angeline ORN, NP   9 months ago Peripheral  edema   [place] [facility] [place], Angeline ORN, [place]              Passed - Patient is not pregnant      Passed - Last BP in normal range    BP Readings from Last 1 Encounters:  05/15/24 136/76

## 2024-12-29 ENCOUNTER — Other Ambulatory Visit: Payer: Self-pay | Admitting: Internal Medicine

## 2024-12-29 ENCOUNTER — Telehealth: Payer: Self-pay

## 2024-12-29 NOTE — Telephone Encounter (Signed)
 Copied from CRM [id number]. Topic: Appointments - Appointment Scheduling >> Dec 28, 2024  3:57 PM Tonda B wrote: Patient/patient representative is calling to schedule an appointment. Refer to attachments for appointment information.  Pt needs a lab order

## 2024-12-29 NOTE — Telephone Encounter (Signed)
 Appt scheduled, does not need lab appt prior.

## 2024-12-31 ENCOUNTER — Other Ambulatory Visit (HOSPITAL_COMMUNITY): Payer: Self-pay

## 2024-12-31 NOTE — Telephone Encounter (Signed)
 Requested medication (s) are due for refill today: yes  Requested medication (s) are on the active medication list: yes  Last refill:  07/02/24  Future visit scheduled: yes  Notes to clinic:    Pharmacy comment: Script Clarification:REQUIRES A PRIOR AUTH, PT HAS NEW INS.            Requested Prescriptions  Pending Prescriptions Disp Refills   OZEMPIC , 0.25 OR 0.5 MG/DOSE, 2 MG/3ML SOPN [Pharmacy Med Name: OZEMPIC  0.25-0.5 MG/DOSE PEN]  0    Sig: INJECT 0.25 MG SUBCUTANEOUSLY ONE TIME PER WEEK     Endocrinology:  Diabetes - GLP-1 Receptor Agonists - semaglutide  Failed - 12/31/2024 11:31 AM      Failed - HBA1C in normal range and within 180 days    Hgb A1c MFr Bld  Date Value Ref Range Status  09/19/2023 6.9 (H) <5.7 % of total Hgb Final    Comment:    For someone without known diabetes, a hemoglobin A1c value of 6.5% or greater indicates that they may have  diabetes and this should be confirmed with a follow-up  test. . For someone with known diabetes, a value <7% indicates  that their diabetes is well controlled and a value  greater than or equal to 7% indicates suboptimal  control. A1c targets should be individualized based on  duration of diabetes, age, comorbid conditions, and  other considerations. . Currently, no consensus exists regarding use of hemoglobin A1c for diagnosis of diabetes for children. .          Failed - Valid encounter within last 6 months    Recent Outpatient Visits           10 months ago Peripheral edema   [place] [facility] [place], Angeline ORN, NP   10 months ago Peripheral edema   [facility] [place], [place], [place]              Passed - Cr in normal range and within 360 days    Creat  Date Value Ref Range Status  02/28/2024 0.68 0.50 - 1.03 mg/dL Final   Creatinine, Ser  Date Value Ref Range Status  05/15/2024 0.71 0.57 - 1.00 mg/dL Final   Creatinine, Urine  Date Value Ref Range  Status  09/19/2023 81 20 - 275 mg/dL Final

## 2025-01-01 ENCOUNTER — Other Ambulatory Visit (HOSPITAL_COMMUNITY): Payer: Self-pay

## 2025-01-04 ENCOUNTER — Other Ambulatory Visit (HOSPITAL_COMMUNITY): Payer: Self-pay

## 2025-01-04 ENCOUNTER — Ambulatory Visit: Admitting: Internal Medicine

## 2025-01-04 ENCOUNTER — Encounter: Payer: Self-pay | Admitting: Internal Medicine

## 2025-01-04 ENCOUNTER — Telehealth: Payer: Self-pay

## 2025-01-04 VITALS — BP 130/84 | Ht 63.0 in | Wt 189.6 lb

## 2025-01-04 DIAGNOSIS — E785 Hyperlipidemia, unspecified: Secondary | ICD-10-CM

## 2025-01-04 DIAGNOSIS — I152 Hypertension secondary to endocrine disorders: Secondary | ICD-10-CM

## 2025-01-04 DIAGNOSIS — E1159 Type 2 diabetes mellitus with other circulatory complications: Secondary | ICD-10-CM | POA: Diagnosis not present

## 2025-01-04 DIAGNOSIS — E119 Type 2 diabetes mellitus without complications: Secondary | ICD-10-CM

## 2025-01-04 DIAGNOSIS — E6609 Other obesity due to excess calories: Secondary | ICD-10-CM

## 2025-01-04 DIAGNOSIS — I480 Paroxysmal atrial fibrillation: Secondary | ICD-10-CM

## 2025-01-04 DIAGNOSIS — G4733 Obstructive sleep apnea (adult) (pediatric): Secondary | ICD-10-CM

## 2025-01-04 DIAGNOSIS — Z7984 Long term (current) use of oral hypoglycemic drugs: Secondary | ICD-10-CM

## 2025-01-04 DIAGNOSIS — K219 Gastro-esophageal reflux disease without esophagitis: Secondary | ICD-10-CM

## 2025-01-04 DIAGNOSIS — E1169 Type 2 diabetes mellitus with other specified complication: Secondary | ICD-10-CM | POA: Diagnosis not present

## 2025-01-04 DIAGNOSIS — F411 Generalized anxiety disorder: Secondary | ICD-10-CM

## 2025-01-04 DIAGNOSIS — R519 Headache, unspecified: Secondary | ICD-10-CM

## 2025-01-04 DIAGNOSIS — E66811 Obesity, class 1: Secondary | ICD-10-CM

## 2025-01-04 DIAGNOSIS — F5101 Primary insomnia: Secondary | ICD-10-CM

## 2025-01-04 DIAGNOSIS — Z7985 Long-term (current) use of injectable non-insulin antidiabetic drugs: Secondary | ICD-10-CM

## 2025-01-04 DIAGNOSIS — G8929 Other chronic pain: Secondary | ICD-10-CM | POA: Insufficient documentation

## 2025-01-04 MED ORDER — METFORMIN HCL 500 MG PO TABS: 500.0000 mg | ORAL_TABLET | Freq: Two times a day (BID) | ORAL | 1 refills | Status: AC

## 2025-01-04 MED ORDER — OMEPRAZOLE 20 MG PO CPDR: 20.0000 mg | DELAYED_RELEASE_CAPSULE | Freq: Every day | ORAL | 1 refills | Status: AC

## 2025-01-04 MED ORDER — FUROSEMIDE 40 MG PO TABS: 40.0000 mg | ORAL_TABLET | Freq: Every day | ORAL | Status: DC

## 2025-01-04 NOTE — Assessment & Plan Note (Signed)
Not medicated Support offered 

## 2025-01-04 NOTE — Patient Instructions (Signed)

## 2025-01-04 NOTE — Assessment & Plan Note (Signed)
 Stress likely a contributing factor, encouraged stress reducing techniques Continue excedrin migraine as needed

## 2025-01-04 NOTE — Assessment & Plan Note (Signed)
 Continue diltiazem  360 mg daily and apixaban  5 mg twice daily She will continue to see cardiology, will follow

## 2025-01-04 NOTE — Assessment & Plan Note (Signed)
 Complicated by obesity Encouraged weight loss as this can help reduce joint pain Continue voltaren  gel OTC as needed Will continue to follow with orthopedics

## 2025-01-04 NOTE — Assessment & Plan Note (Signed)
 Complicated by obesity A1c today Urine microalbumin has been checked within the last year Encouraged her to consume a low-carb diet and exercise for weight loss Continue metformin  500 mg twice daily Will consider switching semaglutide  2 mg weekly to tirzepatide 7.5 mg weekly pending labs Encouraged routine eye exam Encourage routine foot exams She declines immunizations

## 2025-01-04 NOTE — Assessment & Plan Note (Signed)
 Complicated by obesity C-Met and lipid profile today Encouraged her to consume a low-fat diet Not taking rosuvastatin  10 mg daily, will likely need to start pending labs

## 2025-01-04 NOTE — Assessment & Plan Note (Signed)
 Continue voltaren  gel OTC as needed She will continue to follow with orthopedics

## 2025-01-04 NOTE — Progress Notes (Signed)
 "  Subjective:    Patient ID: Wanda Aguilar, female    DOB: 13-Sep-1966, 59 y.o.   MRN: [id number]  HPI  Patient presents the clinic today for follow-up chronic conditions.  Anxiety: Persistent.  She is not taking any medication for this at this time.  She has been on sertraline , bupropion  and fluoxetine  in the past.  She is not currently seeing a therapist.  She denies SI/HI.  Frequent headaches: These occur about 2 times per week.  Triggered by stress. She is taking excedrin migraine as needed with good relief of symptoms.  She does not follow with neurology.  HTN: Associated with diabetes.  Her BP today is 130/84.  She is taking amlodipine -olmesartan , diltiazem  and furosemide  as prescribed.  ECG from 04/2024 reviewed.  Insomnia: She has difficulty falling asleep.  She is not taking anything for this at this time.  There is no sleep study on file.  Afib: Persistent.  Managed on diltiazem  and apixaban .  ECG from 04/2024 reviewed.  She is following with cardiology.  DM2: Her last A1c was 6.9%, 08/2023.  She is taking metformin  and semaglutide  as prescribed.  She does not check her sugars.  She checks her feet routinely.  Her last eye exam was 06/2024.  Flu 04/2023.  Prevnar.  COVID.  Chronic left wrist pain: She is using volteran gel as prescribed.  There is no imaging on file.  She follows with orthopedics.  Chronic left knee pain: There is no imaging on file. She has had a knee injection in the past. She is using voltaren  gel with some relief of symptoms. She follows with orthopedics.   GERD: She is not sure what triggers this. She takes omeprazole  as needed with good relief of symptoms.  There is no upper GI on file.  Severe OSA: She averages hours of sleep per night without the use of her CPAP.  Sleep study from 09/2021 reviewed.  HLD: Associate with diabetes.  Her last LDL was 139, triglycerides 154, 08/2023.  She is not taking rosuvastatin  as prescribed.  She tries to consume a low-fat  diet.  Review of Systems      Past Medical History:  Diagnosis Date   Diastolic dysfunction    a. 01/2021 Echo: EF 60-65%, no rmwa, GrI DD, nl RV fxn, mild MR.   Fibroids    Frequent headaches    Hypertension    PAF (paroxysmal atrial fibrillation) (HCC)    a. Dx 12/2020 in setting of COVID-19 infxn; b. CHA2DS2VASc= 2-->eliquis ; c. 03/2023 Zio: No afib/flutter. 15 runs of SVT, longest 17.1 sec @ 142 (assoc w/ triggered events).   PSVT (paroxysmal supraventricular tachycardia)    a. 03/2023 Zio: 15 SVT runs up to 17.1 secs @ 142 bpm-->corresponded w/ triggered episodes.   Urine incontinence     Current Outpatient Medications  Medication Sig Dispense Refill   albuterol  (VENTOLIN  HFA) 108 (90 Base) MCG/ACT inhaler Inhale 2 puffs into the lungs every 6 (six) hours as needed for wheezing or shortness of breath. 8 g 0   amLODipine -olmesartan  (AZOR ) 10-40 MG tablet Take 1 tablet by mouth daily. 90 tablet 0   apixaban  (ELIQUIS ) 5 MG TABS tablet Take 1 tablet (5 mg total) by mouth 2 (two) times daily. 60 tablet 5   diclofenac  (VOLTAREN ) 50 MG EC tablet Take 50 mg by mouth as needed.     diltiazem  (CARDIZEM ) 30 MG tablet Take 1 tablet by mouth up to 3X a day as needed for a heart  rate of 110 or greater. 30 tablet 10   diltiazem  (TIADYLT  ER) 360 MG 24 hr capsule TAKE 1 CAPSULE BY MOUTH EVERY DAY 90 capsule 0   furosemide  (LASIX ) 40 MG tablet TAKE 1 TABLET BY MOUTH TWICE A DAY 180 tablet 0   gabapentin (NEURONTIN) 100 MG capsule Take 100-200 mg by mouth at bedtime.     Insulin  Pen Needle 31G X 5 MM MISC BD Pen Needles- brand specific Inject insulin  via insulin  pen 6 x daily (Patient not taking: Reported on 04/17/2024) 30 each 1   metFORMIN  (GLUCOPHAGE ) 500 MG tablet TAKE 1 TABLET BY MOUTH 2 TIMES DAILY WITH A MEAL. 180 tablet 0   omeprazole  (PRILOSEC) 20 MG capsule Take 1 capsule (20 mg total) by mouth daily. 90 capsule 1   OZEMPIC , 0.25 OR 0.5 MG/DOSE, 2 MG/3ML SOPN INJECT 0.25 MG SUBCUTANEOUSLY ONE  TIME PER WEEK 9 mL 0   rosuvastatin  (CRESTOR ) 10 MG tablet TAKE 1 TABLET BY MOUTH EVERY DAY (Patient not taking: Reported on 04/17/2024) 90 tablet 0   No current facility-administered medications for this visit.    No Known Allergies  Family History  Problem Relation Age of Onset   Congestive Heart Failure Mother    Diabetes type I Father    Diabetes Sister    Diabetes Sister    Arthritis Maternal Grandmother    Alcohol abuse Maternal Grandfather    Diabetes Paternal Grandmother    Diabetes Paternal Grandfather    Diabetes Paternal Aunt    Diabetes Paternal Uncle     Social History   Socioeconomic History   Marital status: Married    Spouse name: Not on file   Number of children: 2   Years of education: Not on file   Highest education level: Not on file  Occupational History   Not on file  Tobacco Use   Smoking status: Former    Current packs/day: 0.00    Average packs/day: 0.3 packs/day for 11.9 years (3.6 ttl pk-yrs)    Types: Cigarettes    Start date: 01/14/2009    Quit date: 12/21/2020    Years since quitting: 4.0   Smokeless tobacco: Never  Vaping Use   Vaping status: Never Used  Substance and Sexual Activity   Alcohol use: Yes    Comment: occasional   Drug use: No   Sexual activity: Yes  Other Topics Concern   Not on file  Social History Narrative   Not on file   Social Drivers of Health   Tobacco Use: Medium Risk (05/15/2024)   Patient History    Smoking Tobacco Use: Former    Smokeless Tobacco Use: Never    Passive Exposure: Not on Actuary Strain: Not on file  Food Insecurity: Not on file  Transportation Needs: Not on file  Physical Activity: Not on file  Stress: Not on file  Social Connections: Not on file  Intimate Partner Violence: Not on file  Depression (PHQ2-9): Low Risk (02/28/2024)   Depression (PHQ2-9)    PHQ-2 Score: 0  Alcohol Screen: Low Risk (09/19/2023)   Alcohol Screen    Last Alcohol Screening Score (AUDIT): 1   Housing: Not on file  Utilities: Not on file  Health Literacy: Not on file     Constitutional: Patient reports intermittent headaches.  Denies fever, malaise, fatigue, or abrupt weight changes.  HEENT: Denies eye pain, eye redness, ear pain, ringing in the ears, wax buildup, runny nose, nasal congestion, bloody nose, or sore throat.  Respiratory: Denies difficulty breathing, shortness of breath, cough or sputum production.   Cardiovascular: Pt reports swelling in her feet. Denies chest pain, chest tightness, palpitations.  Gastrointestinal: Pt reports intermittent constipation. Denies abdominal pain, bloating, diarrhea or blood in the stool.  GU: Denies urgency, frequency, pain with urination, burning sensation, blood in urine, odor or discharge. Musculoskeletal: Pt reports chronic left wrist pain. Denies decrease in range of motion, difficulty with gait, muscle pain or joint swelling.  Skin: Denies redness, rashes, lesions or ulcercations.  Neurological: Patient reports insomnia.  Denies dizziness, difficulty with memory, difficulty with speech or problems with balance and coordination.  Psych: Patient has a history of anxiety.  Denies depression, SI/HI.  No other specific complaints in a complete review of systems (except as listed in HPI above).  Objective:   Physical Exam  LMP 03/22/2014   Wt Readings from Last 3 Encounters:  05/15/24 196 lb (88.9 kg)  05/11/24 193 lb 12.8 oz (87.9 kg)  04/17/24 196 lb (88.9 kg)    General: Appears her stated age, obese in NAD. Skin: Warm, dry and intact. No rashes noted. HEENT: Head: normal shape and size; Eyes: sclera white and EOMs intact; Neck:  Neck supple, trachea midline. No masses, lumps or thyromegaly present.  Cardiovascular: Normal rate and rhythm. S1,S2 noted.  No murmur, rubs or gallops noted.Trace pitting BLE edema. No carotid bruits noted. Pulmonary/Chest: Normal effort and positive vesicular breath sounds. No respiratory  distress. No wheezes, rales or ronchi noted.  Abdomen: Soft and nontender. Normal bowel sounds. No distention or masses noted. Liver, spleen and kidneys non palpable. Musculoskeletal: Strength 5/5 BUE/BLE. No difficulty with gait.  Neurological: Alert and oriented. Cranial nerves II-XII grossly intact. Coordination normal.  Psychiatric: Tearful and anxious appearing. Racing thoughts noted.    BMET    Component Value Date/Time   NA 141 05/15/2024 0934   K 4.0 05/15/2024 0934   CL 101 05/15/2024 0934   CO2 20 05/15/2024 0934   GLUCOSE 150 (H) 05/15/2024 0934   GLUCOSE 181 (H) 02/28/2024 1455   BUN 15 05/15/2024 0934   CREATININE 0.71 05/15/2024 0934   CREATININE 0.68 02/28/2024 1455   CALCIUM  10.3 (H) 05/15/2024 0934   GFRNONAA >60 02/20/2023 0857   GFRAA 118 02/09/2021 0917    Lipid Panel     Component Value Date/Time   CHOL 227 (H) 09/19/2023 0927   TRIG 154 (H) 09/19/2023 0927   HDL 59 09/19/2023 0927   CHOLHDL 3.8 09/19/2023 0927   VLDL 57.6 (H) 06/22/2015 0746   LDLCALC 139 (H) 09/19/2023 0927    CBC    Component Value Date/Time   WBC 6.4 09/19/2023 0927   RBC 4.43 09/19/2023 0927   HGB 14.0 09/19/2023 0927   HGB 14.7 02/09/2021 0917   HCT 41.7 09/19/2023 0927   HCT 42.0 02/09/2021 0917   PLT 287 09/19/2023 0927   PLT 275 02/09/2021 0917   MCV 94.1 09/19/2023 0927   MCV 92 02/09/2021 0917   MCH 31.6 09/19/2023 0927   MCHC 33.6 09/19/2023 0927   RDW 12.7 09/19/2023 0927   RDW 11.9 02/09/2021 0917   LYMPHSABS 2.1 01/22/2021 0339   MONOABS 0.6 01/22/2021 0339   EOSABS 0.1 01/22/2021 0339   BASOSABS 0.1 01/22/2021 0339    Hgb A1C Lab Results  Component Value Date   HGBA1C 6.9 (H) 09/19/2023           Assessment & Plan:    RTC in 3 months, for your annual exam  Angeline Laura, NP    "

## 2025-01-04 NOTE — Assessment & Plan Note (Signed)
 Complicated by obesity Advised her to try to identify foods that trigger her reflux and avoid them Encouraged weight loss as this can help reduce reflux symptoms Continue omeprazole  20 mg daily

## 2025-01-04 NOTE — Assessment & Plan Note (Signed)
 Complicated by obesity Continue amlodipine -olmesartan10-40 mg and furosemide  40 mg daily Reinforced DASH diet and exercise for weight loss C-Met today

## 2025-01-04 NOTE — Assessment & Plan Note (Signed)
 Not medicated Will monitor

## 2025-01-04 NOTE — Assessment & Plan Note (Signed)
Noncompliant with CPAP Encourage weight loss as this can help reduce sleep apnea symptoms 

## 2025-01-04 NOTE — Assessment & Plan Note (Signed)
 Encouraged diet and exercise for weight loss ?

## 2025-01-05 ENCOUNTER — Ambulatory Visit: Payer: Self-pay | Admitting: Internal Medicine

## 2025-01-05 LAB — LIPID PANEL
Cholesterol: 221 mg/dL — ABNORMAL HIGH
HDL: 52 mg/dL
LDL Cholesterol (Calc): 136 mg/dL — ABNORMAL HIGH
Non-HDL Cholesterol (Calc): 169 mg/dL — ABNORMAL HIGH
Total CHOL/HDL Ratio: 4.3 (calc)
Triglycerides: 192 mg/dL — ABNORMAL HIGH

## 2025-01-05 LAB — COMPREHENSIVE METABOLIC PANEL WITH GFR
AG Ratio: 2 (calc) (ref 1.0–2.5)
ALT: 22 U/L (ref 6–29)
AST: 14 U/L (ref 10–35)
Albumin: 4.9 g/dL (ref 3.6–5.1)
Alkaline phosphatase (APISO): 101 U/L (ref 37–153)
BUN: 12 mg/dL (ref 7–25)
CO2: 26 mmol/L (ref 20–32)
Calcium: 9.5 mg/dL (ref 8.6–10.4)
Chloride: 102 mmol/L (ref 98–110)
Creat: 0.61 mg/dL (ref 0.50–1.03)
Globulin: 2.5 g/dL (ref 1.9–3.7)
Glucose, Bld: 146 mg/dL — ABNORMAL HIGH (ref 65–99)
Potassium: 4.2 mmol/L (ref 3.5–5.3)
Sodium: 138 mmol/L (ref 135–146)
Total Bilirubin: 0.5 mg/dL (ref 0.2–1.2)
Total Protein: 7.4 g/dL (ref 6.1–8.1)
eGFR: 104 mL/min/1.73m2

## 2025-01-05 LAB — CBC
HCT: 41.5 % (ref 35.9–46.0)
Hemoglobin: 13.9 g/dL (ref 11.7–15.5)
MCH: 30.7 pg (ref 27.0–33.0)
MCHC: 33.5 g/dL (ref 31.6–35.4)
MCV: 91.6 fL (ref 81.4–101.7)
MPV: 10.1 fL (ref 7.5–12.5)
Platelets: 294 Thousand/uL (ref 140–400)
RBC: 4.53 Million/uL (ref 3.80–5.10)
RDW: 12.2 % (ref 11.0–15.0)
WBC: 7.3 Thousand/uL (ref 3.8–10.8)

## 2025-01-05 LAB — HEMOGLOBIN A1C
Hgb A1c MFr Bld: 6.8 % — ABNORMAL HIGH
Mean Plasma Glucose: 148 mg/dL
eAG (mmol/L): 8.2 mmol/L

## 2025-01-08 ENCOUNTER — Other Ambulatory Visit (HOSPITAL_COMMUNITY): Payer: Self-pay

## 2025-01-08 NOTE — Telephone Encounter (Signed)
 Pharmacy Patient Advocate Encounter   Received notification from [facility] KEY that prior authorization for Ozempic  2 is required/requested.   Insurance verification completed.   The patient is insured through [employer].   Per test claim: PA required; PA submitted to above mentioned insurance via Fax Key/confirmation #/EOC [facility] Status is pending  Faxed form to [phone]

## 2025-01-11 ENCOUNTER — Other Ambulatory Visit (HOSPITAL_COMMUNITY): Payer: Self-pay

## 2025-01-11 NOTE — Telephone Encounter (Signed)
 Additional information has been requested from the patient's insurance in order to proceed with the prior authorization request. Requested information has been sent, or form has been filled out and faxed back to [phone]

## 2025-01-13 MED ORDER — FUROSEMIDE 40 MG PO TABS: 40.0000 mg | ORAL_TABLET | Freq: Every day | ORAL | 0 refills | Status: AC

## 2025-01-13 MED ORDER — ROSUVASTATIN CALCIUM 10 MG PO TABS: 10.0000 mg | ORAL_TABLET | Freq: Every day | ORAL | 0 refills | Status: DC

## 2025-01-13 MED ORDER — TIRZEPATIDE 7.5 MG/0.5ML ~~LOC~~ SOAJ: 7.5000 mg | SUBCUTANEOUS | 0 refills | Status: AC

## 2025-01-13 NOTE — Addendum Note (Signed)
 Addended by: ANTONETTE ANGELINE ORN on: 01/13/2025 09:57 AM   Modules accepted: Orders

## 2025-01-19 ENCOUNTER — Other Ambulatory Visit (HOSPITAL_COMMUNITY): Payer: Self-pay

## 2025-01-28 ENCOUNTER — Encounter: Payer: Self-pay | Admitting: Internal Medicine

## 2025-01-28 MED ORDER — ATORVASTATIN CALCIUM 20 MG PO TABS: 20.0000 mg | ORAL_TABLET | Freq: Every day | ORAL | 1 refills | Status: AC

## 2025-07-02 ENCOUNTER — Encounter: Payer: Self-pay | Admitting: Internal Medicine
